# Patient Record
Sex: Male | Born: 1940 | Hispanic: No | Marital: Married | Smoking: Former smoker
Health system: Southern US, Community
[De-identification: ages and names within clinical notes are randomized; demographics above are authoritative.]

## PROBLEM LIST (undated history)

## (undated) DIAGNOSIS — I10 Essential (primary) hypertension: Secondary | ICD-10-CM

## (undated) DIAGNOSIS — A481 Legionnaires' disease: Secondary | ICD-10-CM

## (undated) DIAGNOSIS — E785 Hyperlipidemia, unspecified: Secondary | ICD-10-CM

## (undated) DIAGNOSIS — M8580 Other specified disorders of bone density and structure, unspecified site: Secondary | ICD-10-CM

## (undated) DIAGNOSIS — M1990 Unspecified osteoarthritis, unspecified site: Secondary | ICD-10-CM

## (undated) DIAGNOSIS — M069 Rheumatoid arthritis, unspecified: Secondary | ICD-10-CM

## (undated) DIAGNOSIS — E119 Type 2 diabetes mellitus without complications: Secondary | ICD-10-CM

## (undated) DIAGNOSIS — K7460 Unspecified cirrhosis of liver: Secondary | ICD-10-CM

## (undated) DIAGNOSIS — K219 Gastro-esophageal reflux disease without esophagitis: Secondary | ICD-10-CM

## (undated) DIAGNOSIS — E079 Disorder of thyroid, unspecified: Secondary | ICD-10-CM

## (undated) HISTORY — DX: Legionnaires' disease: A48.1

## (undated) HISTORY — DX: Unspecified osteoarthritis, unspecified site: M19.90

## (undated) HISTORY — DX: Hyperlipidemia, unspecified: E78.5

## (undated) HISTORY — PX: KNEE SURGERY: SHX244

## (undated) HISTORY — DX: Gastro-esophageal reflux disease without esophagitis: K21.9

## (undated) HISTORY — DX: Essential (primary) hypertension: I10

## (undated) HISTORY — DX: Type 2 diabetes mellitus without complications: E11.9

## (undated) HISTORY — PX: COLONOSCOPY: SHX174

## (undated) HISTORY — DX: Disorder of thyroid, unspecified: E07.9

## (undated) HISTORY — DX: Other specified disorders of bone density and structure, unspecified site: M85.80

---

## 1998-09-08 ENCOUNTER — Ambulatory Visit (HOSPITAL_COMMUNITY): Admission: RE | Admit: 1998-09-08 | Discharge: 1998-09-08 | Payer: Self-pay | Admitting: *Deleted

## 1999-12-31 ENCOUNTER — Encounter (INDEPENDENT_AMBULATORY_CARE_PROVIDER_SITE_OTHER): Payer: Self-pay | Admitting: *Deleted

## 1999-12-31 ENCOUNTER — Other Ambulatory Visit: Admission: RE | Admit: 1999-12-31 | Discharge: 1999-12-31 | Payer: Self-pay | Admitting: Gastroenterology

## 1999-12-31 ENCOUNTER — Encounter (INDEPENDENT_AMBULATORY_CARE_PROVIDER_SITE_OTHER): Payer: Self-pay | Admitting: Specialist

## 2001-08-06 ENCOUNTER — Emergency Department (HOSPITAL_COMMUNITY): Admission: EM | Admit: 2001-08-06 | Discharge: 2001-08-06 | Payer: Self-pay | Admitting: Emergency Medicine

## 2002-04-26 ENCOUNTER — Encounter: Payer: Self-pay | Admitting: Internal Medicine

## 2002-04-26 ENCOUNTER — Ambulatory Visit (HOSPITAL_COMMUNITY): Admission: RE | Admit: 2002-04-26 | Discharge: 2002-04-26 | Payer: Self-pay | Admitting: Internal Medicine

## 2002-10-07 ENCOUNTER — Encounter: Payer: Self-pay | Admitting: Internal Medicine

## 2002-10-07 ENCOUNTER — Ambulatory Visit (HOSPITAL_COMMUNITY): Admission: RE | Admit: 2002-10-07 | Discharge: 2002-10-07 | Payer: Self-pay | Admitting: Internal Medicine

## 2003-07-18 ENCOUNTER — Encounter: Admission: RE | Admit: 2003-07-18 | Discharge: 2003-07-18 | Payer: Self-pay | Admitting: Internal Medicine

## 2003-08-01 ENCOUNTER — Ambulatory Visit (HOSPITAL_COMMUNITY): Admission: RE | Admit: 2003-08-01 | Discharge: 2003-08-01 | Payer: Self-pay | Admitting: Internal Medicine

## 2004-03-16 ENCOUNTER — Ambulatory Visit: Payer: Self-pay | Admitting: Cardiology

## 2004-03-16 ENCOUNTER — Ambulatory Visit: Payer: Self-pay | Admitting: Pulmonary Disease

## 2004-03-16 ENCOUNTER — Ambulatory Visit: Payer: Self-pay | Admitting: Physical Medicine & Rehabilitation

## 2004-03-16 ENCOUNTER — Inpatient Hospital Stay (HOSPITAL_COMMUNITY): Admission: EM | Admit: 2004-03-16 | Discharge: 2004-04-02 | Payer: Self-pay | Admitting: Emergency Medicine

## 2004-03-23 ENCOUNTER — Encounter: Payer: Self-pay | Admitting: Cardiology

## 2004-04-02 ENCOUNTER — Ambulatory Visit: Payer: Self-pay | Admitting: Physical Medicine & Rehabilitation

## 2004-04-02 ENCOUNTER — Inpatient Hospital Stay (HOSPITAL_COMMUNITY)
Admission: RE | Admit: 2004-04-02 | Discharge: 2004-04-07 | Payer: Self-pay | Admitting: Physical Medicine & Rehabilitation

## 2004-04-12 ENCOUNTER — Ambulatory Visit: Payer: Self-pay | Admitting: Pulmonary Disease

## 2004-04-20 ENCOUNTER — Encounter
Admission: RE | Admit: 2004-04-20 | Discharge: 2004-04-20 | Payer: Self-pay | Admitting: Physical Medicine & Rehabilitation

## 2004-04-26 ENCOUNTER — Ambulatory Visit: Payer: Self-pay | Admitting: Pulmonary Disease

## 2004-05-03 ENCOUNTER — Encounter
Admission: RE | Admit: 2004-05-03 | Discharge: 2004-08-01 | Payer: Self-pay | Admitting: Physical Medicine & Rehabilitation

## 2004-05-04 ENCOUNTER — Ambulatory Visit: Payer: Self-pay | Admitting: Physical Medicine & Rehabilitation

## 2004-05-28 ENCOUNTER — Ambulatory Visit: Payer: Self-pay | Admitting: Pulmonary Disease

## 2004-07-09 ENCOUNTER — Ambulatory Visit: Payer: Self-pay | Admitting: Pulmonary Disease

## 2004-10-06 ENCOUNTER — Ambulatory Visit: Payer: Self-pay | Admitting: Infectious Diseases

## 2004-10-22 ENCOUNTER — Ambulatory Visit: Payer: Self-pay | Admitting: Pulmonary Disease

## 2005-01-03 ENCOUNTER — Ambulatory Visit: Payer: Self-pay | Admitting: Psychology

## 2005-02-03 ENCOUNTER — Ambulatory Visit: Payer: Self-pay | Admitting: Psychology

## 2005-07-19 IMAGING — CR DG CHEST 1V PORT
1 series · 1 of 1 positions shown · non-contrast
Comparison: none

CLINICAL DATA: Fever of unknown origin.  Altered mental status.

[view not recorded]
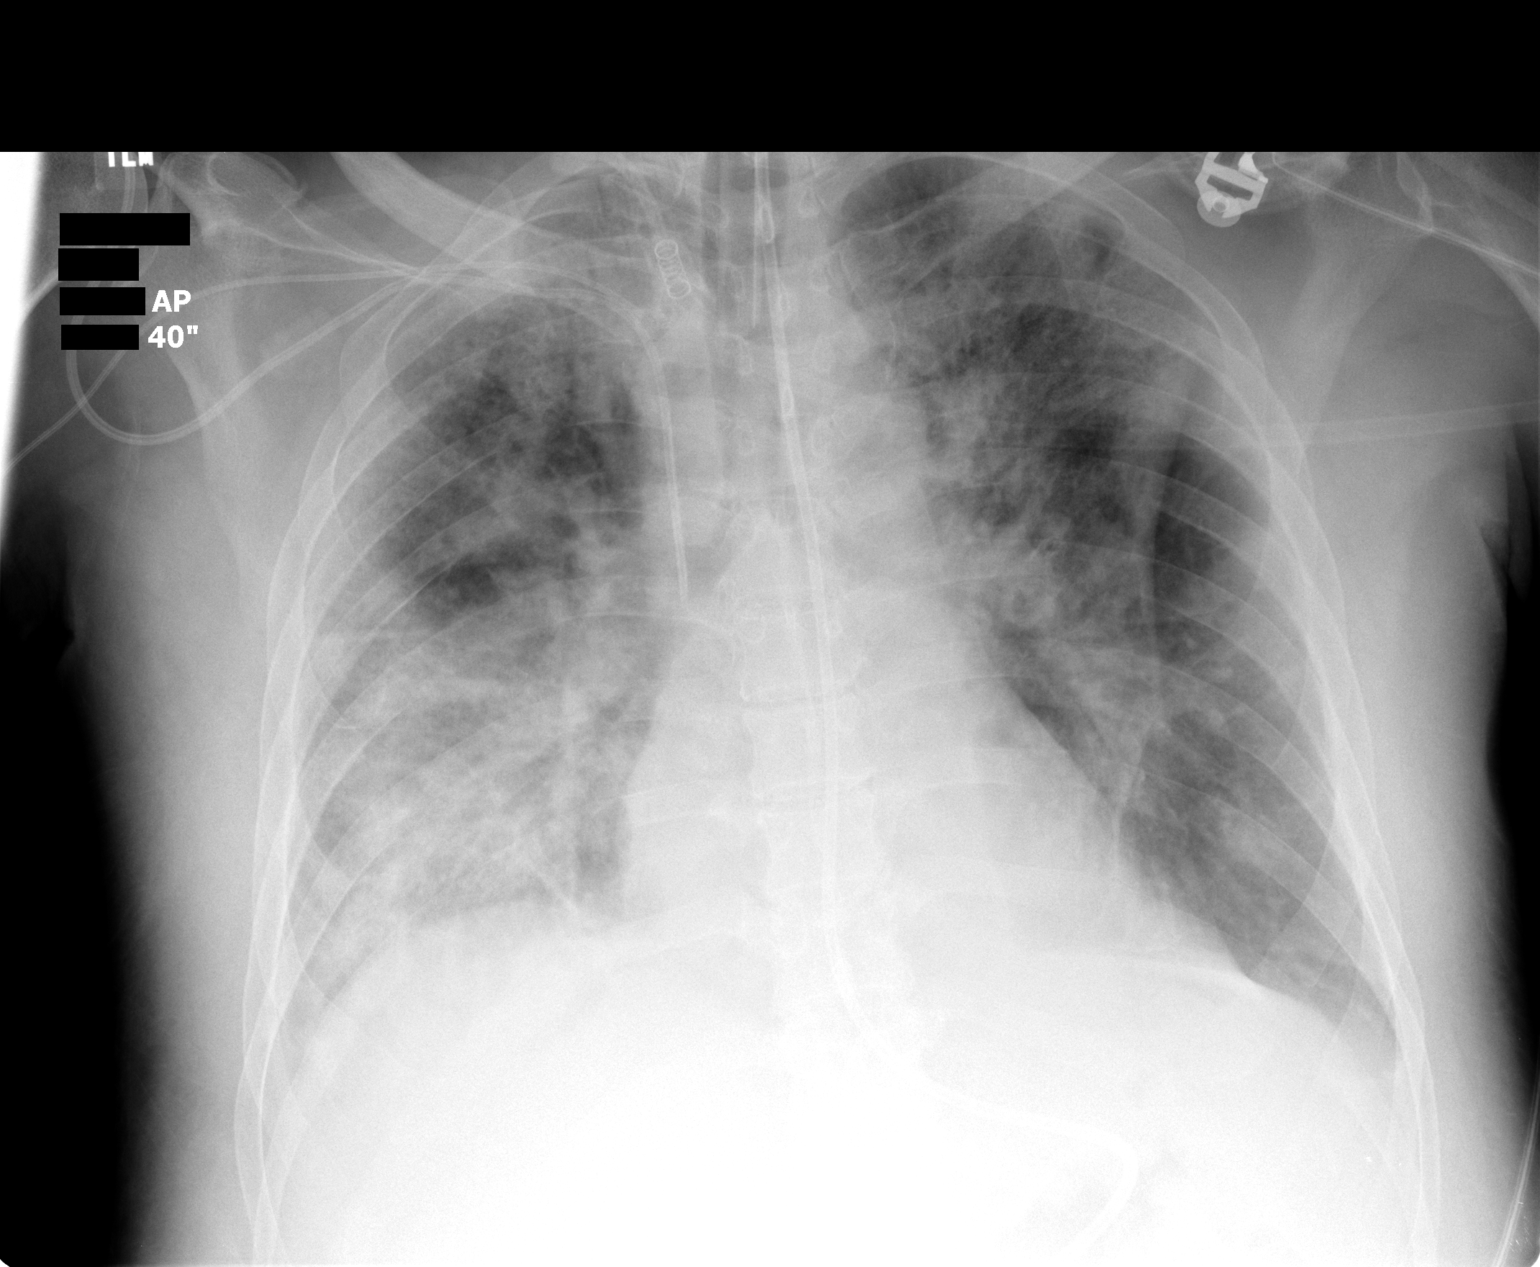

[1 of 1 positions shown; findings below may reference images not displayed]

DG CHEST PORTABLE SINGLE-VIEW:

An AP semierect portable film of the chest dated 03/26/04 at 8284 hours is compared to a previous
study of 03/25/2004 at 0160 hours and shows slight improvement in overall airspace disease and
edema.  The support tubes remain in good position.  There is improvement in bilateral basilar
atelectasis.
IMPRESSION: Interval improvement in aeration and airspace disease.  Support tubes unchanged.

## 2005-07-20 IMAGING — CR DG ABD PORTABLE 1V
1 series · 1 of 1 positions shown · non-contrast
Comparison: none

CLINICAL DATA: Placement of panda tube.  
 PORTABLE ABDOMEN 03/27/04 AT [DATE]:
 Compared with earlier study on this same date. 
 The panda tube has been advanced and extends into the antrum and then loops back with the tip in the fundus of the stomach. 
 Again noted is the extensive right lung infiltrate.

[view not recorded]
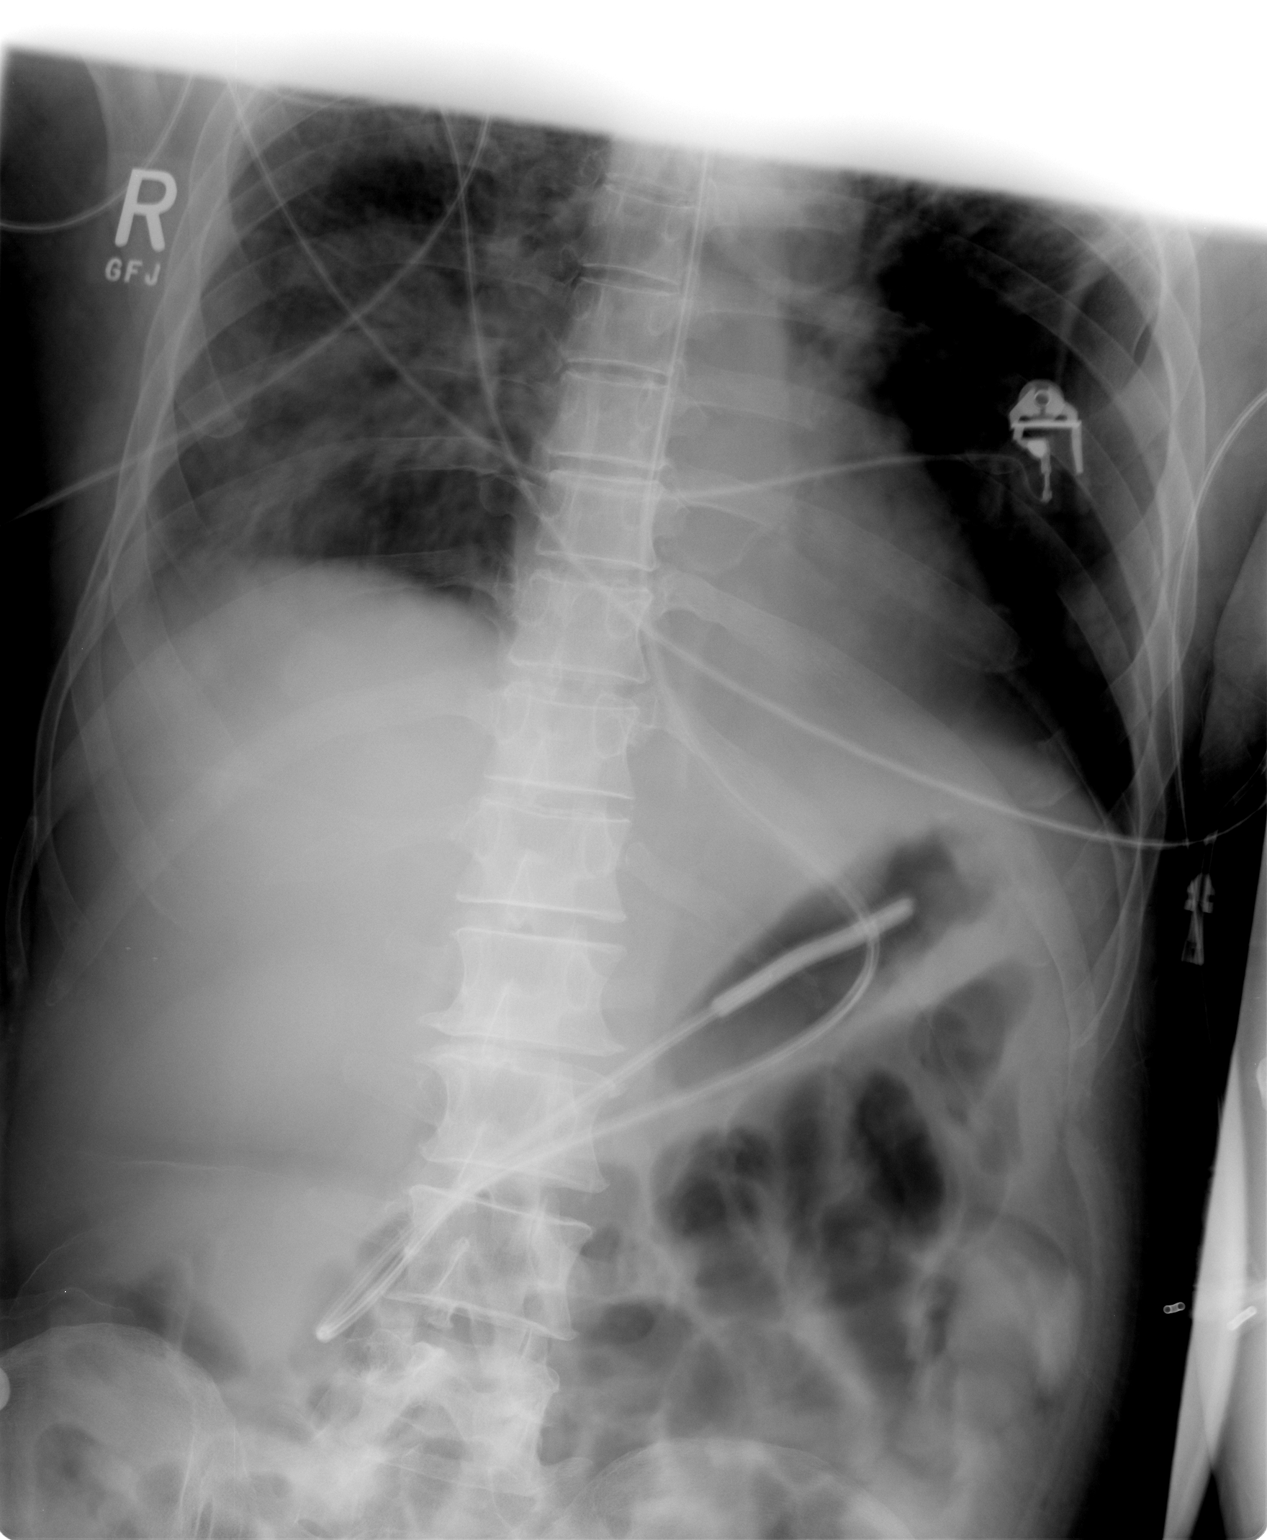

[1 of 1 positions shown; findings below may reference images not displayed]

IMPRESSION: Panda tube is now in the stomach and loops back into the gastric fundus.

## 2005-07-20 IMAGING — CR DG ABD PORTABLE 1V
1 series · 1 of 1 positions shown · non-contrast
Comparison: none

CLINICAL DATA: Panda tube placement.  Fever of unknown origin.
 PORTABLE ABDOMEN, ONE VIEW ? 03/27/2004 ? (7442 Hours)
 Panda tube tip is at the gastroesophageal junction.  Diffuse infiltrate is noted in the right lung base.  
 Bowel gas pattern is normal.

[view not recorded]
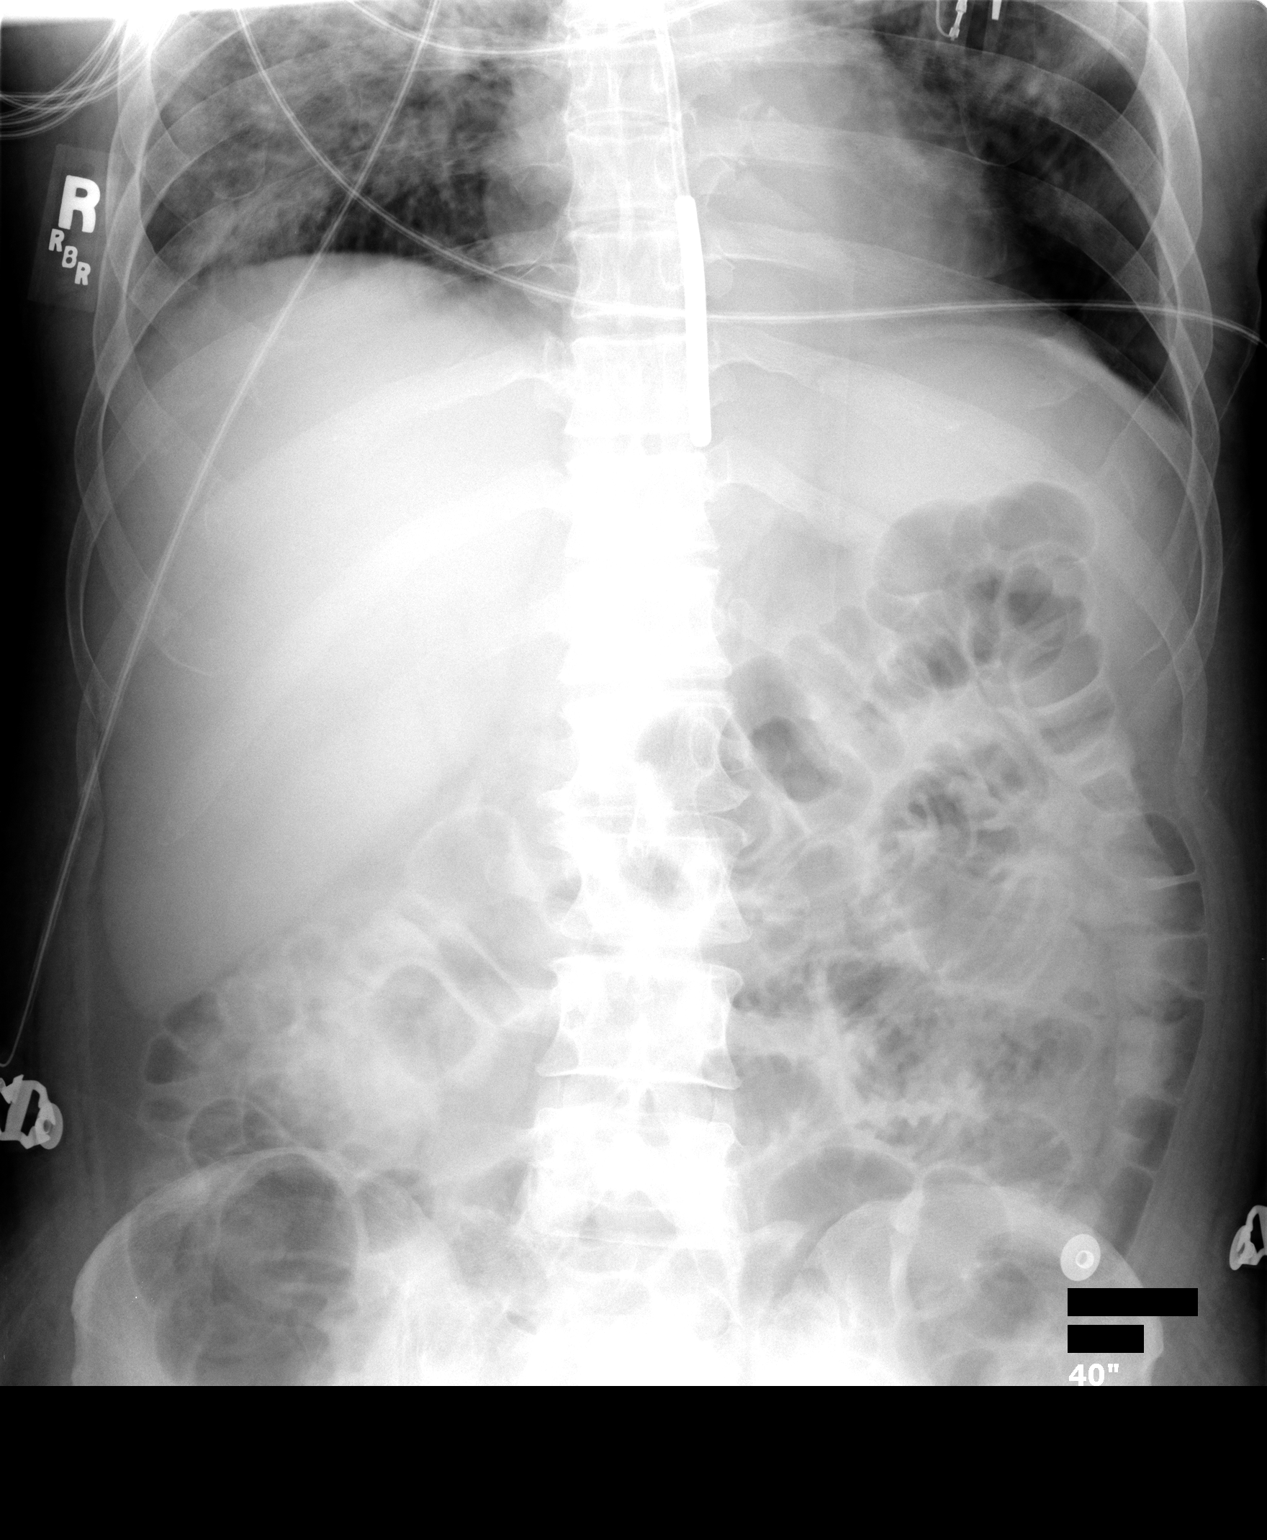

[1 of 1 positions shown; findings below may reference images not displayed]

IMPRESSION: Panda tube tip is at the gastroesophageal junction.

## 2005-12-22 ENCOUNTER — Encounter: Payer: Self-pay | Admitting: Vascular Surgery

## 2005-12-22 ENCOUNTER — Ambulatory Visit (HOSPITAL_COMMUNITY): Admission: RE | Admit: 2005-12-22 | Discharge: 2005-12-22 | Payer: Self-pay | Admitting: Internal Medicine

## 2006-01-24 ENCOUNTER — Encounter (INDEPENDENT_AMBULATORY_CARE_PROVIDER_SITE_OTHER): Payer: Self-pay | Admitting: *Deleted

## 2006-01-24 ENCOUNTER — Encounter: Admission: RE | Admit: 2006-01-24 | Discharge: 2006-01-24 | Payer: Self-pay | Admitting: Internal Medicine

## 2006-03-16 ENCOUNTER — Encounter (INDEPENDENT_AMBULATORY_CARE_PROVIDER_SITE_OTHER): Payer: Self-pay | Admitting: *Deleted

## 2006-03-16 ENCOUNTER — Ambulatory Visit (HOSPITAL_COMMUNITY): Admission: RE | Admit: 2006-03-16 | Discharge: 2006-03-16 | Payer: Self-pay | Admitting: Gastroenterology

## 2006-03-16 ENCOUNTER — Encounter (INDEPENDENT_AMBULATORY_CARE_PROVIDER_SITE_OTHER): Payer: Self-pay | Admitting: Specialist

## 2007-12-03 ENCOUNTER — Encounter: Payer: Self-pay | Admitting: Critical Care Medicine

## 2007-12-03 ENCOUNTER — Telehealth: Payer: Self-pay | Admitting: Critical Care Medicine

## 2007-12-13 ENCOUNTER — Ambulatory Visit (HOSPITAL_COMMUNITY): Payer: Self-pay | Admitting: Psychology

## 2008-01-17 ENCOUNTER — Ambulatory Visit (HOSPITAL_COMMUNITY): Payer: Self-pay | Admitting: Psychology

## 2008-01-18 ENCOUNTER — Ambulatory Visit (HOSPITAL_COMMUNITY): Payer: Self-pay | Admitting: Psychology

## 2009-07-03 ENCOUNTER — Telehealth: Payer: Self-pay | Admitting: Gastroenterology

## 2009-07-08 ENCOUNTER — Encounter (INDEPENDENT_AMBULATORY_CARE_PROVIDER_SITE_OTHER): Payer: Self-pay | Admitting: *Deleted

## 2009-08-06 ENCOUNTER — Encounter (INDEPENDENT_AMBULATORY_CARE_PROVIDER_SITE_OTHER): Payer: Self-pay | Admitting: *Deleted

## 2009-09-04 ENCOUNTER — Ambulatory Visit: Payer: Self-pay | Admitting: Gastroenterology

## 2009-09-04 ENCOUNTER — Encounter (INDEPENDENT_AMBULATORY_CARE_PROVIDER_SITE_OTHER): Payer: Self-pay | Admitting: *Deleted

## 2009-09-04 DIAGNOSIS — R12 Heartburn: Secondary | ICD-10-CM

## 2009-11-09 ENCOUNTER — Telehealth: Payer: Self-pay | Admitting: Gastroenterology

## 2009-11-20 ENCOUNTER — Encounter (INDEPENDENT_AMBULATORY_CARE_PROVIDER_SITE_OTHER): Payer: Self-pay | Admitting: *Deleted

## 2009-12-11 ENCOUNTER — Encounter (INDEPENDENT_AMBULATORY_CARE_PROVIDER_SITE_OTHER): Payer: Self-pay | Admitting: *Deleted

## 2009-12-15 ENCOUNTER — Ambulatory Visit: Payer: Self-pay | Admitting: Gastroenterology

## 2009-12-22 ENCOUNTER — Ambulatory Visit: Payer: Self-pay | Admitting: Gastroenterology

## 2009-12-24 ENCOUNTER — Encounter: Payer: Self-pay | Admitting: Gastroenterology

## 2010-01-22 ENCOUNTER — Encounter: Admission: RE | Admit: 2010-01-22 | Discharge: 2010-01-22 | Payer: Self-pay | Admitting: Internal Medicine

## 2010-02-12 ENCOUNTER — Encounter: Admission: RE | Admit: 2010-02-12 | Discharge: 2010-02-12 | Payer: Self-pay | Admitting: Internal Medicine

## 2010-02-19 ENCOUNTER — Encounter: Admission: RE | Admit: 2010-02-19 | Discharge: 2010-02-19 | Payer: Self-pay | Admitting: Internal Medicine

## 2010-03-18 ENCOUNTER — Encounter: Admission: RE | Admit: 2010-03-18 | Discharge: 2010-03-18 | Payer: Self-pay | Admitting: Internal Medicine

## 2010-06-13 ENCOUNTER — Encounter: Payer: Self-pay | Admitting: Internal Medicine

## 2010-06-24 NOTE — Letter (Signed)
Summary: [facility] Instructions  [facility]  [place], [place] [address]   Phone: [phone]  Fax: [phone]       [name]    [birth year]    MRN: [id number]        Procedure Day /Date:10/07/09     Arrival Time:730 am     Procedure Time:830 am     Location of Procedure:                    X  [place] (4th Floor)                        [facility]   Starting 5 days prior to your procedure 10/02/09 do not eat nuts, seeds, popcorn, corn, beans, peas,  salads, or any raw vegetables.  Do not take any fiber supplements (e.g. Metamucil, Citrucel, and Benefiber).  THE DAY BEFORE YOUR PROCEDURE         DATE: 10/06/09  DAY: TUE  1.  Drink clear liquids the entire day-NO SOLID FOOD  2.  Do not drink anything colored red or purple.  Avoid juices with pulp.  No orange juice.  3.  Drink at least 64 oz. (8 glasses) of fluid/clear liquids during the day to prevent dehydration and help the prep work efficiently.  CLEAR LIQUIDS INCLUDE: Water Jello Ice Popsicles Tea (sugar ok, no milk/cream) Powdered fruit flavored drinks Coffee (sugar ok, no milk/cream) Gatorade Juice: apple, white grape, white cranberry  Lemonade Clear bullion, consomm, broth Carbonated beverages (any kind) Strained chicken noodle soup Hard Candy                             4.  In the morning, mix first dose of MoviPrep solution:    Empty 1 Pouch A and 1 Pouch B into the disposable container    Add lukewarm drinking water to the top line of the container. Mix to dissolve    Refrigerate (mixed solution should be used within 24 hrs)  5.  Begin drinking the prep at 5:00 p.m. The MoviPrep container is divided by 4 marks.   Every 15 minutes drink the solution down to the next mark (approximately 8 oz) until the full liter is complete.   6.  Follow completed prep with 16 oz of clear liquid of your choice (Nothing red or purple).   Continue to drink clear liquids until bedtime.  7.  Before going to bed, mix second dose of MoviPrep solution:    Empty 1 Pouch A and 1 Pouch B into the disposable container    Add lukewarm drinking water to the top line of the container. Mix to dissolve    Refrigerate  THE DAY OF YOUR PROCEDURE      DATE: 10/07/09 DAY:WED  Beginning at 330 a.m. (5 hours before procedure):         1. Every 15 minutes, drink the solution down to the next mark (approx 8 oz) until the full liter is complete.  2. Follow completed prep with 16 oz. of clear liquid of your choice.    3. You may drink clear liquids until 630 am (2 HOURS BEFORE PROCEDURE).   MEDICATION INSTRUCTIONS  Unless otherwise instructed, you should take regular prescription medications with a small sip of water   as early as possible the morning of your procedure.  OTHER INSTRUCTIONS  You will need a responsible adult at least 70 years of age to accompany you and drive you home.   This person must remain in the waiting room during your procedure.  Wear loose fitting clothing that is easily removed.  Leave jewelry and other valuables at home.  However, you may wish to bring a book to read or  an iPod/MP3 player to listen to music as you wait for your procedure to start.  Remove all body piercing jewelry and leave at home.  Total time from sign-in until discharge is approximately 2-3 hours.  You should go home directly after your procedure and rest.  You can resume normal activities the  day after your procedure.  The day of your procedure you should not:   Drive   Make legal decisions   Operate machinery   Drink alcohol   Return to work  You will receive specific instructions about eating, activities and medications before you leave.    The above instructions have been reviewed and explained to me by   _______________________    I fully understand and can verbalize these instructions  _____________________________ Date _________

## 2010-06-24 NOTE — Miscellaneous (Signed)
Summary: [facility]  Clinical Lists Changes  Medications: Added new medication of MOVIPREP 100 GM  SOLR (PEG-KCL-NACL-NASULF-NA ASC-C) As per prep instructions. - Signed Rx of MOVIPREP 100 GM  SOLR (PEG-KCL-NACL-NASULF-NA ASC-C) As per prep instructions.;  #1 x 0;  Signed;  Entered by: Ulice Dash RN;  Authorized by: Ladene Artist MD [facility];  Method used: Electronically to [facility] [phone]*, [place], [place], [place]  [address], Ph: [phone] or [phone], Fax: [phone] Observations: Added new observation of NKA: T (12/15/2009 9:52)    Prescriptions: MOVIPREP 100 GM  SOLR (PEG-KCL-NACL-NASULF-NA ASC-C) As per prep instructions.  #1 x 0   Entered by:   Ulice Dash RN   Authorized by:   Ladene Artist MD [facility]   Signed by:   Ulice Dash RN on 12/15/2009   Method used:   Electronically to        [facility] [phone]* (retail)       [address] [place], [place]  [address]       Ph: [phone] or [phone]       Fax: [phone]   RxID:   [id number]

## 2010-06-24 NOTE — Progress Notes (Signed)
Summary: Switch from Dr. Fuller Plan to Dr. Ardis Hughs  Phone Note Call from Patient Call back at [facility] Phone [phone]   Caller: Patient Call For: Dr. Fuller Plan Reason for Call: Talk to Nurse Summary of Call: Pt is a patient of Dr. Fuller Plan and wants to see Dr. Ardis Hughs. Initial call taken by: Webb Laws,  July 03, 2009 1:54 PM  Follow-up for Phone Call        Dr Fuller Plan is this ok with you? Please find records from my care of this patient. There are no GI records in [facility]. Follow-up by: Ladene Artist MD Marval Regal,  July 03, 2009 2:44 PM  Additional Follow-up for Phone Call Additional follow up Details #1::        I have ordered paper chart.    Additional Follow-up for Phone Call Additional follow up Details #2::    Paper chart placed on your desk Follow-up by: Barb Merino RN, [place],  July 06, 2009 11:41 AM  Additional Follow-up for Phone Call Additional follow up Details #3:: Details for Additional Follow-up Action Taken: Per Dr Fuller Plan ok to switch.  Dr Ardis Hughs will you accept patient? Additional Follow-up by: Barb Merino RN, [place],  July 06, 2009 12:15 PM    I think I would like to see the paper chart as well.    Appended Document: Switch from Dr. Fuller Plan to Dr. Ardis Hughs I will put paper chart in your office for your review  Appended Document: Switch from Dr. Fuller Plan to Dr. Ardis Hughs happy to see him.  Appended Document: Switch from Dr. Fuller Plan to Dr. Ardis Hughs message left for pt to call and schedule appt with Dr Ardis Hughs

## 2010-06-24 NOTE — Miscellaneous (Signed)
Summary: rx  Clinical Lists Changes  Medications: Added new medication of XIFAXAN 550 MG TABS (RIFAXIMIN) one p.o. tid - Signed Rx of XIFAXAN 550 MG TABS (RIFAXIMIN) one p.o. tid;  #30 x 0;  Signed;  Entered by: Margie Ege;  Authorized by: Ladene Artist MD [facility];  Method used: Electronically to [facility] [phone]*, [place], [place], [place]  [address], Ph: [phone] or [phone], Fax: [phone] Observations: Added new observation of ALLERGY REV: Done (12/22/2009 11:04)    Prescriptions: XIFAXAN 550 MG TABS (RIFAXIMIN) one p.o. tid  #30 x 0   Entered by:   Margie Ege   Authorized by:   Ladene Artist MD [facility]   Signed by:   Margie Ege on 12/22/2009   Method used:   Electronically to        [facility] [phone]* (retail)       [address] [place], [place]  [address]       Ph: [phone] or [phone]       Fax: [phone]   RxID:   [id number]

## 2010-06-24 NOTE — Letter (Signed)
Summary: New Patient letter  [facility]  [place], [place] [address]   Phone: [phone]  Fax: [phone]       07/08/2009 MRN: [id number]  [name] [address] [place], [place]  [address]  Dear [name],  Welcome to the Gastroenterology Division at [facility].    You are scheduled to see Dr.  Ardis Hughs on 08-11-09 at 2:30p.m. on the 3rd floor at [facility], [place] [employer].  We ask that you try to arrive at our office 15 minutes prior to your appointment time to allow for check-in.  We would like you to complete the enclosed self-administered evaluation form prior to your visit and bring it with you on the day of your appointment.  We will review it with you.  Also, please bring a complete list of all your medications or, if you prefer, bring the medication bottles and we will list them.  Please bring your insurance card so that we may make a copy of it.  If your insurance requires a referral to see a specialist, please bring your referral form from your primary care physician.  Co-payments are due at the time of your visit and may be paid by cash, check or credit card.     Your office visit will consist of a consult with your physician (includes a physical exam), any laboratory testing he/she may order, scheduling of any necessary diagnostic testing (e.g. x-ray, ultrasound, CT-scan), and scheduling of a procedure (e.g. Endoscopy, Colonoscopy) if required.  Please allow enough time on your schedule to allow for any/all of these possibilities.    If you cannot keep your appointment, please call [phone] to cancel or reschedule prior to your appointment date.  This allows [place] the opportunity to schedule an appointment for another patient in need of care.  If you do not cancel or reschedule by 5 p.m. the business day prior to your appointment date, you will be charged a $50.00 late cancellation/no-show fee.    Thank you for  choosing [facility] for your medical needs.  We appreciate the opportunity to care for you.  Please visit [place] at our website  to learn more about our practice.                     Sincerely,                                                             The Gastroenterology Division

## 2010-06-24 NOTE — Letter (Signed)
Summary: [facility] Instructions  [facility]  [place], [place] [address]   Phone: [phone]  Fax: [phone]       [name]    May 12, 1969    MRN: [id number]        Procedure Day /Date: Tuesday 12-22-09     Arrival Time: 9:00 a.m.     Procedure Time: 10:00 a.m.     Location of Procedure:                    _x_  [place] (4th Floor)   [place]   Starting 5 days prior to your procedure  12-17-09 do not eat nuts, seeds, popcorn, corn, beans, peas,  salads, or any raw vegetables.  Do not take any fiber supplements (e.g. Metamucil, Citrucel, and Benefiber).  THE DAY BEFORE YOUR PROCEDURE         DATE:  12-21-09   DAY: Monday   1.  Drink clear liquids the entire day-NO SOLID FOOD  2.  Do not drink anything colored red or purple.  Avoid juices with pulp.  No orange juice.  3.  Drink at least 64 oz. (8 glasses) of fluid/clear liquids during the day to prevent dehydration and help the prep work efficiently.  CLEAR LIQUIDS INCLUDE: Water Jello Ice Popsicles Tea (sugar ok, no milk/cream) Powdered fruit flavored drinks Coffee (sugar ok, no milk/cream) Gatorade Juice: apple, white grape, white cranberry  Lemonade Clear bullion, consomm, broth Carbonated beverages (any kind) Strained chicken noodle soup Hard Candy                             4.  In the morning, mix first dose of MoviPrep solution:    Empty 1 Pouch A and 1 Pouch B into the disposable container    Add lukewarm drinking water to the top line of the container. Mix to dissolve    Refrigerate (mixed solution should be used within 24 hrs)  5.  Begin drinking the prep at 5:00 p.m. The MoviPrep container is divided by 4 marks.   Every 15 minutes drink the solution down to the next mark (approximately 8 oz) until the full liter is complete.   6.  Follow completed prep with 16 oz of clear liquid of your choice (Nothing red or  purple).  Continue to drink clear liquids until bedtime.  7.  Before going to bed, mix second dose of MoviPrep solution:    Empty 1 Pouch A and 1 Pouch B into the disposable container    Add lukewarm drinking water to the top line of the container. Mix to dissolve    Refrigerate  THE DAY OF YOUR PROCEDURE      DATE:  12-22-09  DAY: Tuesday  Beginning at  5:00 a.m. (5 hours before procedure):         1. Every 15 minutes, drink the solution down to the next mark (approx 8 oz) until the full liter is complete.  2. Follow completed prep with 16 oz. of clear liquid of your choice.    3. You may drink clear liquids until  8:00 a.m. (2 HOURS BEFORE PROCEDURE).   MEDICATION INSTRUCTIONS  Unless otherwise instructed, you should take regular prescription medications with a small sip of water   as early as possible the morning of your procedure.           OTHER INSTRUCTIONS  You will need a responsible adult at least 70 years of age to accompany you and drive you home.   This person must remain in the waiting room during your procedure.  Wear loose fitting clothing that is easily removed.  Leave jewelry and other valuables at home.  However, you may wish to bring a book to read or  an iPod/MP3 player to listen to music as you wait for your procedure to start.  Remove all body piercing jewelry and leave at home.  Total time from sign-in until discharge is approximately 2-3 hours.  You should go home directly after your procedure and rest.  You can resume normal activities the  day after your procedure.  The day of your procedure you should not:   Drive   Make legal decisions   Operate machinery   Drink alcohol   Return to work  You will receive specific instructions about eating, activities and medications before you leave.    The above instructions have been reviewed and explained to me by   Ulice Dash RN  December 15, 2009 10:18 AM   I fully understand and can  verbalize these instructions _____________________________ Date _________

## 2010-06-24 NOTE — Progress Notes (Signed)
Summary: Switch Providers  Phone Note Call from Patient   Caller: Patient Call For: Dr. Ardis Hughs Reason for Call: Talk to Nurse Summary of Call: pt. would like to switch from Dr. Ardis Hughs back to Dr. Fuller Plan. pt. stated "he was not pleased w/Dr. Ardis Hughs. He prescribed him Nexium and he has been on Nexium for the past 10 yrs and it does not work." Initial call taken by: Webb Laws,  November 09, 2009 4:10 PM  Follow-up for Phone Call        That is ok with me if OK with Dr. Fuller Plan.  I did quite a bit more than simply represcribe his nexium actually.Marland Kitchen. (changed the way he was taking it so it was at proper timing before meal, added H2 blocker at night, counciled him on GERD (specifically that he smoking can make GERD worse), gave him a gerd handout and also set him up for screening colonoscopy. Follow-up by: Milus Banister MD,  November 10, 2009 7:38 AM  Additional Follow-up for Phone Call Additional follow up Details #1::        I would like to review his paper chart before deciding. Has seen Dr. Collene Mares, me, Dr. Ardis Hughs and now wants to see me again. Additional Follow-up by: Ladene Artist MD Madaline Guthrie 9:36 AM    Additional Follow-up for Phone Call Additional follow up Details #2::    paper chart ordered.] Chart on your desk Barb Merino RN, [facility]  November 10, 2009 11:30 AM Follow-up by: Barb Merino RN, [place],  November 10, 2009 9:42 AM  Additional Follow-up for Phone Call Additional follow up Details #3:: Details for Additional Follow-up Action Taken: OK to switch back to me. Additional Follow-up by: Ladene Artist MD Marval Regal,  November 10, 2009 4:25 PM  screening colon scheduled for 12/22/09 10:00, pre-visit 12/15/09 10:00 Barb Merino RN, [facility]  November 11, 2009 8:31 AM

## 2010-06-24 NOTE — Assessment & Plan Note (Signed)
History of Present Illness Visit Type: Initial Visit Primary GI MD: Owens Loffler MD Primary Provider: Emilee Hero, MD Chief Complaint: bloating and gas after eating started when pt. quit smoking with Nicorrette gum History of Present Illness:     very pleasant 70 year old vegetarian man who has had many years of upper GI discomforts. He underwent several tests by Dr. Collene Mares in 2007. This included upper GI series, an EGD. On the EGD she documented some mild gastritis, some polyps, some mild duodenitis. 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 pylori, no evidence for celiac sprue.  he had a colonoscopy by Dr. Fuller Plan in 2001, hyperplastic polyps were found, he was recommended to have a repeat colonoscopy at 10 year interval.   last october, he stopped smoking. Started taking a patch, had skin reaction and then tried the gum, eventually switched to nicorett lozenges.  He feels bloating in abdomen.  He has a lot of flatus as well. He takes gas ex periodically and it can help.   he will have soreness in abdomen, bloated, burning in epigastrium if he does not take enxium.   He will not get pyrosis, acid taste in mouth.  he takes nexium after dinner.  his pant size has increased from 34 to 38 inches.  eating will cause bloating.  No nausea, no vmoitting.  All foods will cause bloating. He is a Midwife.  DAiry isn't such an issue.  Lactaid milk doesn't change his symptoms.  overall his weight is stable over the past year.           Current Medications (verified): 1)  Nexium 40 Mg Cpdr (Esomeprazole Magnesium) .Marland Kitchen.. 1 Capsule By Mouth Once Daily 2)  Ramipril 5 Mg Caps (Ramipril) .Marland Kitchen.. 1 Capsule By Mouth Two Times A Day 3)  Synthroid 100 Mcg Tabs (Levothyroxine Sodium) .Marland Kitchen.. 1 Tablet By Mouth Once Daily  Allergies (verified): No Known Drug Allergies  Past History:  Past Medical History: arthritis Hypertension Hypothyroidism Cigarette dependence  Past Surgical  History: none  Family History: no colon cancer  Social History: he is married, he has to children, he is a Designer, industrial/product, he smokes cigarettes although continues to try to quit, he drinks one alcoholic beverage per day, he drinks one caffeinated beverage per day.  Review of Systems       Pertinent positive and negative review of systems were noted in the above HPI and GI specific review of systems.  All other review of systems was otherwise negative.   Vital Signs:  Patient profile:   70 year old male Height:      67.5 inches Weight:      163.13 pounds BMI:     25.26 Pulse rate:   74 / minute Pulse rhythm:   regular BP sitting:   130 / 74  (left arm)  Vitals Entered By: Randye Lobo NCMA (September 04, 2009 10:09 AM)  Physical Exam  Additional Exam:  Constitutional: generally well appearing Psychiatric: alert and oriented times 3 Eyes: extraocular movements intact Mouth: oropharynx moist, no lesions Neck: supple, no lymphadenopathy Cardiovascular: heart regular rate and rythm Lungs: CTA bilaterally Abdomen: soft, non-tender, non-distended, no obvious ascites, no peritoneal signs, normal bowel sounds Extremities: no lower extremity edema bilaterally Skin: no lesions on visible extremities    Impression & Recommendations:  Problem # 1:  Dyspepsia, GERD related? I suspect some of his symptoms are GERD related. I recommended that he change the way taking his proton pump inhibitor  and add H2 blocker at bedtime. He will also be given a GERD handout. We will see how he responds to these changes.  Problem # 2:  routine risk for colon cancer he is due for 10 year interval colonoscopy around now. We will schedule this at his soonest convenience.  Patient Instructions: 1)  You should change the way you are taking your antiacid medicine (nexium) so that you are taking it 20-30 minutes prior to a decent meal as that is the way the pill is designed to work most  effectively.  Take this before lunchtime meal. 2)  Start zantac, one pill at bedtime every night. 3)  You will be scheduled to have a colonoscopy. 4)  GERD handout.  Smoking can make acid issues worse. 5)  Continue to try to stop smoking. 6)  A copy of this information will be sent to Dr. Ashby Dawes. 7)  The medication list was reviewed and reconciled.  All changed / newly prescribed medications were explained.  A complete medication list was provided to the patient / caregiver.  Appended Document: Orders Update/movi    Clinical Lists Changes  Problems: Added new problem of HEARTBURN (ICD-787.1) Added new problem of SPECIAL SCREENING FOR MALIGNANT NEOPLASMS COLON (ICD-V76.51) Medications: Added new medication of MOVIPREP 100 GM  SOLR (PEG-KCL-NACL-NASULF-NA ASC-C) As per prep instructions. - Signed Rx of MOVIPREP 100 GM  SOLR (PEG-KCL-NACL-NASULF-NA ASC-C) As per prep instructions.;  #1 x 0;  Signed;  Entered by: Christian Mate CMA (AAMA);  Authorized by: Milus Banister MD;  Method used: Electronically to [facility] [phone]*, [place], [place], [place]  [address], Ph: [phone] or [phone], Fax: [phone] Orders: Added new Test order of Colonoscopy (Colon) - Signed    Prescriptions: MOVIPREP 100 GM  SOLR (PEG-KCL-NACL-NASULF-NA ASC-C) As per prep instructions.  #1 x 0   Entered by:   Christian Mate CMA ([place])   Authorized by:   Milus Banister MD   Signed by:   Christian Mate CMA ([place]) on 09/04/2009   Method used:   Electronically to        [facility] [phone]* (retail)       [address] [place], [place]  [address]       Ph: [phone] or [phone]       Fax: [phone]   RxID:   [id number]

## 2010-06-24 NOTE — Letter (Signed)
Summary: New Patient letter  [facility]  [place], [place] [address]   Phone: [phone]  Fax: [phone]       08/06/2009 MRN: [id number]  [name] [place], [place]  [address]  Dear [name],  Welcome to the Gastroenterology Division at [facility].    You are scheduled to see Dr.  Ardis Hughs on 09-04-09 at 10:00a.m. on the 3rd floor at [facility], [place] [employer].  We ask that you try to arrive at our office 15 minutes prior to your appointment time to allow for check-in.  We would like you to complete the enclosed self-administered evaluation form prior to your visit and bring it with you on the day of your appointment.  We will review it with you.  Also, please bring a complete list of all your medications or, if you prefer, bring the medication bottles and we will list them.  Please bring your insurance card so that we may make a copy of it.  If your insurance requires a referral to see a specialist, please bring your referral form from your primary care physician.  Co-payments are due at the time of your visit and may be paid by cash, check or credit card.     Your office visit will consist of a consult with your physician (includes a physical exam), any laboratory testing he/she may order, scheduling of any necessary diagnostic testing (e.g. x-ray, ultrasound, CT-scan), and scheduling of a procedure (e.g. Endoscopy, Colonoscopy) if required.  Please allow enough time on your schedule to allow for any/all of these possibilities.    If you cannot keep your appointment, please call [phone] to cancel or reschedule prior to your appointment date.  This allows [place] the opportunity to schedule an appointment for another patient in need of care.  If you do not cancel or reschedule by 5 p.m. the business day prior to your appointment date, you will be charged a $50.00 late cancellation/no-show fee.    Thank you for  choosing [facility] for your medical needs.  We appreciate the opportunity to care for you.  Please visit [place] at our website  to learn more about our practice.                     Sincerely,                                                             The Gastroenterology Division

## 2010-06-24 NOTE — Letter (Signed)
Summary: Colonoscopy Letter  [facility]  [place], [place] [address]   Phone: [phone]  Fax: [phone]      November 20, 2009 MRN: [id number]   [name] [name], [name]  [address]   Dear [name],   According to your medical record, it is time for you to schedule a Colonoscopy. The American Cancer Society recommends this procedure as a method to detect early colon cancer. Patients with a family history of colon cancer, or a personal history of colon polyps or inflammatory bowel disease are at increased risk.  This letter has beeen generated based on the recommendations made at the time of your procedure. If you feel that in your particular situation this may no longer apply, please contact our office.  Please call our office at [phone] to schedule this appointment or to update your records at your earliest convenience.  Thank you for cooperating with [place] to provide you with the very best care possible.   Sincerely,  Norberto Sorenson T. Fuller Plan, M.D.  [facility] [phone]

## 2010-06-24 NOTE — Procedures (Signed)
Summary: Colonoscopy  Patient: [name] Note: All result statuses are Final unless otherwise noted.  Tests: (1) Colonoscopy (COL)   COL Colonoscopy           DONE (C)     [facility] [employer]     [place], [place]  [address]           COLONOSCOPY PROCEDURE REPORT     PATIENT:  [name]  MR#:  [id number]     BIRTHDATE:  Jun 03, 1940, 69 yrs. old  GENDER:  male     ENDOSCOPIST:  Norberto Sorenson T. Fuller Plan, MD, [facility]           PROCEDURE DATE:  12/22/2009     PROCEDURE:  Colonoscopy with biopsy and snare polypectomy     ASA CLASS:  Class II     INDICATIONS:  1) Routine Risk Screening     MEDICATIONS:   Fentanyl 50 mcg IV, Versed 7 mg IV     DESCRIPTION OF PROCEDURE:   After the risks benefits and     alternatives of the procedure were thoroughly explained, informed     consent was obtained.  Digital rectal exam was performed and     revealed no abnormalities.   The LB PCF-H180AL [id number] endoscope     was introduced through the anus and advanced to the cecum, which     was identified by both the appendix and ileocecal valve, without     limitations.  The quality of the prep was excellent, using     MoviPrep.  The instrument was then slowly withdrawn as the colon     was fully examined.     <<PROCEDUREIMAGES>>     FINDINGS:  A sessile polyp was found in the mid transverse colon.     It was 5 mm in size. The polyp was removed using cold biopsy     forceps.  Three polyps were found in the sigmoid colon. They were     4 - 6 mm in size. Polyps were snared without cautery. Retrieval     was successful. Six polyps were found in the rectum and sigmoid     colon. They were 3 - 4 mm in size. Polyps retrieved and sent to     pathology.  This was otherwise a normal examination of the colon.     Retroflexed views in the rectum revealed internal hemorrhoids,     moderate.  The time to cecum =  1.5  minutes. The scope was then     withdrawn (time =  14.5  min) from the  patient and the procedure     completed.           COMPLICATIONS:  None           ENDOSCOPIC IMPRESSION:     1) 5 mm sessile polyp in the mid transverse colon     2) 4 - 6 mm Three polyps in the sigmoid colon     3) 3 - 4 mm Six polyps in the rectum and sigmoid colon     4) Internal hemorrhoids           RECOMMENDATIONS:     1) Await pathology results     2) If the polyps removed today are adenomatous (pre-cancerous),     you will need a repeat colonoscopy in 5 years. Otherwise you     should continue to follow colorectal cancer screening guidelines  for "routine risk" patients with colonoscopy in 10 years.3)     Xifaxan 519m po tid, #30, no refills for chronic gas/bloating           Malcolm T. SFuller Plan MD, FMarval Regal          CC: AMerrilee Seashore MD           n.     REVISED:  12/22/2009 11:20 AM     eSIGNED:   MNorberto SorensonT. Stark at 12/22/2009 11:20 AM           KGraylon Good [id number] Note: An exclamation mark (!) indicates a result that was not dispersed into the flowsheet. Document Creation Date: 12/22/2009 11:22 AM _______________________________________________________________________  (1) Order result status: Final Collection or observation date-time: 12/22/2009 10:45 Requested date-time:  Receipt date-time:  Reported date-time:  Referring Physician:   Ordering Physician: MLucio Edward([id number] Specimen Source:  Source: ETawanna CoolerOrder Number: [id number] site:   Appended Document: Colonoscopy     Procedures Next Due Date:    Colonoscopy: 12/2014

## 2010-06-24 NOTE — Procedures (Signed)
Summary: EGD   EGD  Procedure date:  03/16/2006  Findings:      Location: [facility]     NAME:  [name], [name]          ACCOUNT NO.:  [id number]   MEDICAL RECORD NO.:  [id number]          PATIENT TYPE:  AMB   LOCATION:  ENDO                         FACILITY:  [facility]   PHYSICIAN:  Nelwyn Salisbury, M.D.  DATE OF BIRTH:  May 24, 1940   DATE OF PROCEDURE:  03/16/2006  DATE OF DISCHARGE:                                 OPERATIVE REPORT   PROCEDURE PERFORMED:  Esophagogastroduodenoscopy with small bowel and  gastric biopsies and snare polypectomy x3.   ENDOSCOPIST:  Nelwyn Salisbury, M.D.   INSTRUMENT USED:  Olympus video panendoscope.   INDICATIONS FOR PROCEDURE:  The patient is a 70 year old [place] male  undergoing an esophagogastroduodenoscopy for epigastric pain and  longstanding history of reflux.  He had an upper GI series that showed a  hiatal hernia and some prominent gastric folds.   PREPROCEDURE PREPARATION:  Informed consent was procured from the patient.  The patient was fasted for four hours prior to the procedure.  The risks and  benefits of the procedure were discussed with the patient in great detail.   PREPROCEDURE PHYSICAL:  The patient had stable vital signs.  Neck supple,  chest clear to auscultation.  S1, S2 regular.  Abdomen soft with normal  bowel sounds.   DESCRIPTION OF PROCEDURE:  The patient was placed in the left lateral  decubitus position and sedated with 50 mcg of fentanyl and 5 mg of Versed in  slow incremental doses.  Once the patient was adequately sedated and  maintained on low-flow oxygen and continuous cardiac monitoring, the Olympus  video panendoscope was advanced through the mouth piece over the tongue into  the esophagus under direct vision.  The entire esophagus appeared normal  with no evidence of ring, stricture, masses, esophagitis or Barrett's  mucosa.  The scope was then advanced to the stomach.  Four gastric polyps  were removed by hot snare from the proximal stomach.  One was ablated with  the tip of the snare as this was very small.  A small hiatal hernia was seen  on high retroflexion.  Antral biopsies were rule out presence of  Helicobacter pylori by pathology.  Prominent gastric folds were noted  especially in the proximal stomach.  Erosions were noted in the duodenal  bulb.  Small bowel biopsies were done to rule out sprue.  The patient  tolerated the procedure well without immediate complications.   IMPRESSION:  1. Small bowel biopsies done to rule out sprue.  2. Erosions noted in the duodenal bulb.  3. Prominent gastric fold, antral biopsies are done to rule out      Helicobacter pylori.  4. Four gastric polyps were removed by hot snare.  5. Small hiatal hernia.  6. Prominent fold biopsied in the proximal stomach (bottle #4).   RECOMMENDATIONS:  1. Await pathology results.  2. Avoid all nonsteroidals including aspirin for now.  3. Outpatient followup in the next two weeks, earlier if need be for      further  recommendations.  4. Continue proton pump inhibitor as recommended in the past.      Nelwyn Salisbury, M.D.  Electronically Signed     JNM/MEDQ  D:  03/16/2006  T:  03/17/2006  Job:  [id number]   cc:   Merrilee Seashore, M.D.

## 2010-06-24 NOTE — Letter (Signed)
Summary: Patient Notice- Polyp Results  [facility]  [address] [place], [place] [address]   Phone: [phone]  Fax: [phone]        December 24, 2009 MRN: [id number]    [name] [place], [place]  [address]    Dear [name],  I am pleased to inform you that the colon polyp(s) removed during your recent colonoscopy was (were) found to be benign (no cancer detected) upon pathologic examination.  I recommend you have a repeat colonoscopy examination in 5 years to look for recurrent polyps, as having colon polyps increases your risk for having recurrent polyps or even colon cancer in the future.  Should you develop new or worsening symptoms of abdominal pain, bowel habit changes or bleeding from the rectum or bowels, please schedule an evaluation with either your primary care physician or with me.  Continue treatment plan as outlined the day of your exam.  Please call us if you are having persistent problems or have questions about your condition that have not been fully answered at this time.  Sincerely,  Ladene Artist MD [facility]  This letter has been electronically signed by your physician.  Appended Document: Patient Notice- Polyp Results letter mailed

## 2010-10-08 NOTE — Op Note (Signed)
NAMEEDIN, KON NO.:  [id number]   MEDICAL RECORD NO.:  [id number]          PATIENT TYPE:  AMB   LOCATION:  ENDO                         FACILITY:  [facility]   PHYSICIAN:  Nelwyn Salisbury, M.D.  DATE OF BIRTH:  04/05/1941   DATE OF PROCEDURE:  03/16/2006  DATE OF DISCHARGE:                                 OPERATIVE REPORT   PROCEDURE PERFORMED:  Esophagogastroduodenoscopy with small bowel and  gastric biopsies and snare polypectomy x3.   ENDOSCOPIST:  Nelwyn Salisbury, M.D.   INSTRUMENT USED:  Olympus video panendoscope.   INDICATIONS FOR PROCEDURE:  The patient is a 70 year old [place] male  undergoing an esophagogastroduodenoscopy for epigastric pain and  longstanding history of reflux.  He had an upper GI series that showed a  hiatal hernia and some prominent gastric folds.   PREPROCEDURE PREPARATION:  Informed consent was procured from the patient.  The patient was fasted for four hours prior to the procedure.  The risks and  benefits of the procedure were discussed with the patient in great detail.   PREPROCEDURE PHYSICAL:  The patient had stable vital signs.  Neck supple,  chest clear to auscultation.  S1, S2 regular.  Abdomen soft with normal  bowel sounds.   DESCRIPTION OF PROCEDURE:  The patient was placed in the left lateral  decubitus position and sedated with 50 mcg of fentanyl and 5 mg of Versed in  slow incremental doses.  Once the patient was adequately sedated and  maintained on low-flow oxygen and continuous cardiac monitoring, the Olympus  video panendoscope was advanced through the mouth piece over the tongue into  the esophagus under direct vision.  The entire esophagus appeared normal  with no evidence of ring, stricture, masses, esophagitis or Barrett's  mucosa.  The scope was then advanced to the stomach.  Four gastric polyps  were removed by hot snare from the proximal stomach.  One was ablated with  the tip of the snare as this was  very small.  A small hiatal hernia was seen  on high retroflexion.  Antral biopsies were rule out presence of  Helicobacter pylori by pathology.  Prominent gastric folds were noted  especially in the proximal stomach.  Erosions were noted in the duodenal  bulb.  Small bowel biopsies were done to rule out sprue.  The patient  tolerated the procedure well without immediate complications.   IMPRESSION:  1. Small bowel biopsies done to rule out sprue.  2. Erosions noted in the duodenal bulb.  3. Prominent gastric fold, antral biopsies are done to rule out      Helicobacter pylori.  4. Four gastric polyps were removed by hot snare.  5. Small hiatal hernia.  6. Prominent fold biopsied in the proximal stomach (bottle #4).   RECOMMENDATIONS:  1. Await pathology results.  2. Avoid all nonsteroidals including aspirin for now.  3. Outpatient followup in the next two weeks, earlier if need be for      further recommendations.  4. Continue proton pump inhibitor as recommended in the past.      Nelwyn Salisbury, M.D.  Electronically  Signed     JNM/MEDQ  D:  03/16/2006  T:  03/17/2006  Job:  [id number]   cc:   Merrilee Seashore, M.D.

## 2010-10-08 NOTE — Op Note (Signed)
NAMEEMANI, MORAD NO.:  [id number]   MEDICAL RECORD NO.:  [id number]          PATIENT TYPE:  INP   LOCATION:  2930                         FACILITY:  [facility]   PHYSICIAN:  Derrill Kay, M.D. [facility] OF BIRTH:  26-Dec-1940   DATE OF PROCEDURE:  03/24/2004  DATE OF DISCHARGE:                                 OPERATIVE REPORT   PROCEDURE PERFORMED:  Therapeutic bronchoscopy.   INDICATIONS FOR PROCEDURE:  Mucous plugging in a patient with acute  respiratory distress syndrome on ventilator.   DESCRIPTION OF PROCEDURE:  The patient was sedated on the ventilator in the  intensive care unit being monitored in all aspects to include blood  pressure, heart rate, oxygen saturations and respirations.  The patient was  sedated with drips that are currently ongoing.  The patient had Portex  adaptor placed on his ventilator circuit.  The Pentax fiberoptic  bronchoscope was then advanced through the Portex adaptor.  Immediately in  the endotracheal tube, tenacious mucous plugs could be noted.  The  bronchoscope was advanced.  The endotracheal tube could be seen at 4 cm  above the carina.  There was excessive inflammation and irritation of the  trachea, carina and diffusely throughout the tracheobronchial tree on the  right and on the left.  No endobronchial lesions were noted.  There were  extensive mucous plugs bilaterally on left and right.  These were lavaged  with Mucomyst bicarbonate and saline combination all in equal parts.  Bronchoscope was then retrieved to allow the patient to recuperate from mild  desaturations.  The bronchoscope was then readvanced through the Portex  adaptor and extensive mucous plugs were retrieved from the right lower lobe  and left lower lobe subsegments.  Lavage was done until clear.  Again  excessive inflammation could be seen through the whole entire mucosa.   IMPRESSION:  Acute respiratory distress.  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   Plan will be to increase pulmonary toilet, continue ventilatory support.  Will also placed the patient on intravenous steroids.  Further plans pending  response to the above.   At the end of the procedure, the patient was stable and remained stable in  the ICU with ventilator settings set back to the original.  Procedure was  done on 100% FIO2.  FIO2 was then returned to 40% prior to termination of  same.       LG/MEDQ  D:  03/24/2004  T:  03/24/2004  Job:  [id number]

## 2010-10-08 NOTE — Op Note (Signed)
NAMEBRAILEN, [name] NO.:  [id number]   MEDICAL RECORD NO.:  [id number]          PATIENT TYPE:  INP   LOCATION:  9038                         FACILITY:  [facility]   PHYSICIAN:  Levy Sjogren, M.D.     DATE OF BIRTH:  [birth year]   DATE OF PROCEDURE:  03/16/2004  DATE OF DISCHARGE:                                 OPERATIVE REPORT   PREOPERATIVE DIAGNOSIS:  Altered mental status with fever.   POSTOPERATIVE DIAGNOSIS:  Altered mental status with fever.   OPERATION PERFORMED:  Lumbar puncture.   SURGEON:  Levy Sjogren, M.D.   INDICATIONS FOR PROCEDURE:  Suspicion for meningitis, given the patient's  fever and changed mental status.   DESCRIPTION OF PROCEDURE:  The patient was positively identified as the  intended target.  Verbal consent was obtained from the wife and patient  after explaining the need for the procedure.  The patient was placed in  right lateral position with his knees close to his abdomen in fetal  position.  The iliac crests were identified and the area of entry was also  identified and then sterilized and draped in sterile fashion.  Local  anesthesia with 1% Xylocaine was used.  Spinal tap needle was advanced  through skin, subcutaneous skin, fascia, ligament into meningeal space  without difficulty.  Clear fluid was obtained and two sample tubes were  obtained, one sent for cell count and protein and glucose.  The second was  sent for cultures and bacterial antigens.  The patient tolerated the  procedure well without problems.  He was kept flat position for two hours  after the procedure and was able to sit up afterward without issues.  There  was no immediate leak or hemorrhage in the area of the procedure.       SA/MEDQ  D:  03/16/2004  T:  03/16/2004  Job:  [id number]

## 2010-10-08 NOTE — Assessment & Plan Note (Signed)
DATE OF BIRTH:  May 18, 1941.   MEDICAL RECORD NUMBER:  Is #[id number].   HISTORY:  A 70 year old male with onset of bilateral lung infiltrates  progressing to respiratory failure necessitating intubation and mechanical  ventilation, onset March 16, 2004.  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  He was admitted to  rehab, on April 02, 2004, to continue strengthening, and he was  discharged on April 07, 2004.  He followed up with outpatient therapy and  has completed physical therapy at an independent leve.  He also went through  speech therapy and they felt speech and cognition were within functional  limits for his needs and no further evaluation was recommended.  He is  ambulating both on a treadmill when it is cold outside at about 15 minutes a  day, and when it is nice outside he will walk an additional mile a day.  He  does have some knee pain which he notes when he first stands up but after a  couple of minutes it goes away.  He also has foot pain mainly at night.  It  is a burning type of sensation bilateral feet distal to the ankles.  He does  notice some mild swelling of the bilateral lower extremities as well.  No  seizures.  No new cognitive issues.  He is ambulating on a treadmill 2.5  miles per hour 10-12 minutes specifically.   SOCIAL HISTORY:  Married.  He has not worked since his onset of pneumonia.   REVIEW OF SYSTEMS:  As above.  No headaches.  No weakness or numbness in the  legs.   PHYSICAL EXAMINATION:  VITAL SIGNS:  Blood pressure 123/77, pulse 63,  respirations 20, O2 sat 98% on room air.  GENERAL:  No acute distress.  Mood and affect appropriate.  MUSCULOSKELETAL:  His knees have no evidence of effusion.  He has no  evidence of ligamentous laxity.  No tenderness over the tendon or over the  knee cap.  Ankles,  he has mild edema  of the left foot and ankle area,  compared to the right side.  He has decreased left S1 sensation compared to  the right side.  He has intact ability for heel-walking, toe-walking, and  full strength bilateral lower extremities.  No deficits to pinprick noted.   IMPRESSION:  1.  History of deconditioning secondary to respiratory failure, prolonged      hospitalization.  2.  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  3.  Steroid psychosis/delirium, resolved.  4.  Patellar tendonitis.   PLAN:  1.  The patient already has some Celebrex at home.  I have advised him to      take one tablet twice a day for a solid week to help with his knee      pains.  2.  He has started nortriptyline 10 q.h.s. for his foot pain.  I advised      that recovery usually occurs over a 6-12 month period of time.  No EMG      necessary unless progressive symptoms.  3.  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      Erick Alley  D:  05/04/2004 12:51:41  T:  05/04/2004 14:22:51  Job #:  [id number]   cc:   Asencion Noble, M.D. [facility]

## 2010-10-08 NOTE — Discharge Summary (Signed)
NAMEASIER, DESROCHES NO.:  [id number]   MEDICAL RECORD NO.:  [id number]          PATIENT TYPE:  IPS   LOCATION:  8110                         FACILITY:  [facility]   PHYSICIAN:  Charlett Blake, M.D.DATE OF BIRTH:  December 24, 1940   DATE OF ADMISSION:  04/02/2004  DATE OF DISCHARGE:  04/07/2004                                 DISCHARGE SUMMARY   DISCHARGE DIAGNOSES:  1.  Acute respiratory distress syndrome with sepsis and legionella      pneumonia.  2.  Steroid myopathy.  3.  Hypertension.  4.  Hypothyroidism.  5.  History of tobacco abuse.  6.  Hypokalemia, resolved.   HISTORY OF PRESENT ILLNESS:  This 70 year old male has unremarkable past  medical history, admitted October 25 with diffuse body aches, altered mental  status and fever.  Lumbar puncture negative.  Cranial CT scan negative.  Chest x-ray with bilateral infiltrates.  Placed on intravenous antibiotics.  Bouts of tachycardia with orthostatic blood pressure changes.  Sodium 122.  Critical care medicine.  Acute respiratory distress.  Sepsis.  Noted  legionella pneumonia, maintained on mechanical ventilator and steroid  protocol.  Profound weakness after steroids, felt to be steroid myopathy.  Now off all antibiotics.  Steroid taper ongoing.  Blood pressure monitored.  Echocardiogram November 1 with ejection fraction 65-75%.  Mild delirium.  He  was tapered from his Seroquel, admitted for comprehensive rehabilitation  program.   PAST MEDICAL HISTORY:  See discharge diagnoses.   MEDICATIONS PRIOR TO ADMISSION:  Synthroid and Nexium.   ALLERGIES:  None.   SOCIAL HISTORY:  Lives with wife in [place].  He is a professor at [employer].  Two level home, bedroom upstairs.  Multiple entry steps.  Wife  to assist on discharge.   HOSPITAL COURSE:  Patient with progressive gains while on rehab services  with therapies initiated on a b.i.d. basis.  The following issues were  followed during the  patient's rehab course.  Pertaining to the patient's  acute respiratory distress with sepsis deconditioning, continued to do well  in all areas.  Steroid myopathy with steroid protocol since completed.  He  was now supervised for his ambulation.  Strength was 5/5 throughout.  Blood  pressures controlled with Lopressor.  He remained on his hormone supplement  for hypothyroidism.  He had a history of tobacco abuse that was discussed at  length and need for cessation of smoking.  Plan was to be discharged without  outpatient therapies.  Follow up neuropsychological testing with Dr. Antionette Poles in one month.  His delirium had essentially resolved.  His Seroquel  had been discontinued.  Full family teaching completed.  He was discharged  to home.   LABORATORY DATA:  Latest labs showed a sodium of 135, potassium 3.7,  hemoglobin 10.9, hematocrit 30.9.   DISCHARGE MEDICATIONS:  1.  Lopressor 25 mg b.i.d.  2.  Synthroid 100 mcg daily.  3.  Multivitamin daily.  4.  Nexium daily.  5.  He had been on subcutaneous Lovenox throughout his rehabilitation      course.  This was discontinued at discharge.  DISCHARGE INSTRUCTIONS:  1.  Diet:  Regular.  2.  Activity:  As tolerated.   SPECIAL INSTRUCTIONS:  No driving.  Outpatient therapies as advised.  He  would follow up with Dr. Letta Pate at the Outpatient Rehab Service office in  one month.       DA/MEDQ  D:  04/07/2004  T:  04/07/2004  Job:  [id number]   cc:   Asencion Noble, M.D. [facility]   [place], M.D.  Hospitalist

## 2010-10-08 NOTE — Consult Note (Signed)
[name], [name] NO.:  [id number]   MEDICAL RECORD NO.:  [id number]          PATIENT TYPE:  INP   LOCATION:  2930                         FACILITY:  [facility]   PHYSICIAN:  Asencion Noble, M.D. LHCDATE OF BIRTH:  05-15-1941   DATE OF CONSULTATION:  DATE OF DISCHARGE:                                   CONSULTATION   CHIEF COMPLAINT:  1.  Septic shock.  2.  Acute respiratory distress syndrome.   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  Then on October 25, he developed confusion. It was  worsening, and high fever.  He was brought to the emergency room.  He had a  lumbar puncture which was negative, bilateral infiltrates on chest x-ray  noted.  The patient was admitted to 3300, given Rocephin and Zithromax.  Through the night, he became worse with worse restlessness, low blood  pressure, tachycardia, persistent fever.  X-rays showed worsening bilateral  infiltrates.  The patient progressed to the point where he required transfer  to ICU and placed initially on bilevel and then intubated at 6 a.m.  Now in  progressive shock.  We were asked to see the patient for septic shock.   PAST MEDICAL HISTORY:  1.  History of hypercholesterolemia.  2.  Hypothyroidism.  3.  No other medical history.   SURGICAL HISTORY:  None.   MEDICATIONS:  Prior to admission:  1.  Synthroid.  2.  Tamiflu.  3.  Tylenol.   ALLERGIES:  None.   FAMILY HISTORY:  Coronary artery disease in father.   SOCIAL HISTORY:  A 20-pack a year smoker, occasional ethanol. Professor at  [employer].   REVIEW OF SYSTEMS:  Noncontributory.   PHYSICAL EXAMINATION:  VITAL SIGNS:  Temperature 103, pulse 130,  respirations 40, blood pressure 60/20.  GENERAL:  This patient is in acute distress, already orally intubated.  Poorly perfusing peripherally.  CHEST:  Bilateral rales, poor air movement, expired wheezes.  NEUROLOGIC:  The patient is unresponsive, agitated, delirious.  SKIN:  Cool.  Poor perfusion.  No edema.  PULSES:  The pulses are 2+ and symmetric.  ABDOMEN:  Soft, nontender, no organomegaly.  CARDIAC:  Resting tachycardiac without S3, normal S1 and S2.  No murmur.  HEENT:  No jugular venous distension or lymphadenopathy.  The patient is  orally intubated.  NEUROLOGIC:  Sedated on a ventilator, agitated, unresponsive.   LABORATORY DATA:  Chest x-ray, ARDS.  Bilateral infiltrates, right and left  pneumonia prior to the ARDS development.  White count 14,300, hemoglobin  15.8, platelet count 178,000.  Sodium 123, potassium 3.5, chloride 93, CO2  22.  BUN 10, creatinine 1.2.  Blood sugar 112.  AST 162.  ALT 78.  Bilirubin  1.5.   Cardiac enzymes satisfactory.  CK 37 35.  Calcium level is okay.  CSF level  is negative.   IMPRESSION:  1.  Severe sepsis with shock with acute respiratory distress syndrome      secondary to severe community-acquired pneumonia source  with multisystem      organ failure.  2.  Abnormal liver functions.  3.  Metabolic acidosis, TME.   PLAN:  1.  Will apply surviving sepsis campaign with 6 and 24 hour bundle.  See      orders.  This will include Xigris, low stretch ARDS protocol, early goal-      directed septic shock fluid resuscitation.  2.  Check and monitor lactate and cortisol levels.  3.  Maintain SVO2 greater than 70%, CVP greater than 12, MEP greater than 65      mmHg.  4.  Give IV hydrocortisone.  5.  Give broad spectrum antibiotics include vancomycin, broaden cultures to      obtain a BAL of the lungs and urine culture, along with the recent      already obtained blood cultures.  6.  Sedation protocols.  7.  Titrate light Levophed, kinetic therapy, intensive insulin therapy.  8.  Check ultrasound of the gallbladder, with abnormal LFT's.  9.  Will update family.  10. Place Panda tube  to obtain early nutrition.      Patr   PW/MEDQ  D:  03/17/2004  T:  03/17/2004  Job:  [id number]   cc:   Verdie Drown L. Gwynneth Aliment, M.D.

## 2010-10-08 NOTE — Discharge Summary (Signed)
NAMEEYOB, GODLEWSKI NO.:  [id number]   MEDICAL RECORD NO.:  [id number]          PATIENT TYPE:  INP   LOCATION:  2505                         FACILITY:  [facility]   PHYSICIAN:  Asencion Noble, M.D. LHCDATE OF BIRTH:  [birth year]   DATE OF ADMISSION:  03/16/2004  DATE OF DISCHARGE:  04/02/2004                           DISCHARGE SUMMARY - REFERRING   ADMISSION DIAGNOSIS:  He was admitted to [facility] for close monitoring, IV fluid resuscitation and work-up of  acute symptoms.   DISCHARGE DIAGNOSES:  1.  Severe steroid myopathy after severe septic shock and ventilator      dependent respiratory failure.  2.  Anemia.  3.  Diabetes type 2.   HISTORY OF PRESENT ILLNESS:  This is a 70 year old male patient of [place]  heritage who experienced flulike symptoms from March 12, 2004, to March 16, 2004, on the date of admission.  Symptoms included fever, headache and  body ache.  The patient was seen by physician who initiated Tamiflu  treatment in addition to Tylenol for symptomatic relief.  However, his  symptoms did not improve.  On March 15, 2004, his symptoms worsened.  He  developed hiccups and difficulty falling asleep.  On March 16, 2004, he  presented with restlessness and confusion, high fevers, spikes and  was  brought to the emergency room at [facility].  On  admission, he complained of achiness and general but nonspecific complaints.  He was noted to have acute mental status changes on admission.   PERTINENT LABORATORY DATA ON DISCHARGE:  Hemoglobin 9.3, white blood cell  count 7.4, platelet count 504.  His last potassium was 3.1, BUN 10,  creatinine 0.1 with glucose of 100.   His last chest x-ray showed bilateral alveolar opacification with some  asymmetric pulmonary edema that was done on March 30, 2004.   HOSPITAL COURSE BY DISCHARGE DIAGNOSIS:  PROBLEM #1 -  COMMUNITY ACQUIRED  PNEUMONIA WITH POSITIVE LEGIONELLA ANTIGEN IN THE URINE:  Patient was  originally treated with vancomycin, Rocephin and azithromycin; after  cultures were determined, Rifampin was added.  Antibiotics were discontinued  on March 23, 2004, and then resumed with vancomycin on March 24, 2004,  and Avelox on March 26, 2004, for fever spikes.  All antibiotics had been  discontinued on March 30, 2004.   PROBLEM #2 -  ADULT RESPIRATORY DISTRESS SYNDROME:  This was treated in the  intensive care setting on the day of admission.  The ARDS protocol was used  and started on March 17, 2004.  The patient required prone positioning for  oxygenation along with lung recruitment maneuvers every four hours during  the first two acute days.  Additional therapies for his ARDS including  neuromuscular blockade, heavy sedation which include the use of Diprivan,  Fentanyl and Versed.  Upon March 27, 2004, the weaning protocol was  initiated and the patient was liberated from mechanical ventilation on  March 29, 2004.   PROBLEM #3 -  SEPTIC SHOCK:  This was treated with the aforementioned  antibiotics on  intensive care monitoring.  Goal  directed volume  resuscitation.  Levophed for blood pressure management with protection of  end organ systems.   PROBLEM #4 -  ADRENAL INSUFFICIENCY:  This was treated with a seven-day  course of hydrocortisone.   PROBLEM #5 -  DIABETES TYPE 2:  Treated initially with critical care  protocol and then Lantus was added during his illness.  He is currently now  on sliding scale coverage with good glycemic control.   PROBLEM #6 -  ANEMIA:  This was treated with the Epogen protocol.  He now  has a stable hemoglobin of 9.3.   MEDICATIONS:  1.  Lopressor 25 mg p.o. twice a day.  2.  Novolog insulin h.s. coverage of 2 to 5 units depending on capillary      blood glucose.  3.  Potassium chloride 20 mEq p.o. daily.  4.  Ambien 10 mg p.o. q.h.s.  5.  Catapres 0.1 mg  p.o. b.i.d.  6.  Levothyroxine 100 mcg p.o. daily.  7.  Multivitamin p.o. daily.  8.  Lovenox 40 mg injection subcutaneously q.24h.  9.  Protonix 40 mg p.o. q.a.m.  10. Ferrous sulfate 325 mg p.o. t.i.d. with meals.  11. Sliding scale insulin 2 to 15 units before meals depending on capillary      blood glucose.  12. Ensure p.o. t.i.d.  13. Advair 250/50 one puff b.i.d.  14. The p.r.n. medications include:      1.  Bisacodyl suppositories p.r. p.r.n.      2.  Acetaminophen 650 mg p.o. q.4h. p.r.n.   DIET:  Upon discharge his diet is as tolerated with no concentrated sweets.   He will be transferring to the [facility] and we will be available for further follow-up.   His examination upon admission is improvement.  He is currently on room air  and looking forward to rehabilitation therapy.      Pete   PB/MEDQ  D:  04/02/2004  T:  04/02/2004  Job:  [id number]   cc:   Darcus Austin, D.O.  [address]  [place]  [place] [address]  Fax: [place].

## 2010-10-08 NOTE — H&P (Signed)
[name], [name] NO.:  [id number]   MEDICAL RECORD NO.:  [id number]          PATIENT TYPE:  INP   LOCATION:  1155                         FACILITY:  [facility]   PHYSICIAN:  Levy Sjogren, M.D.     DATE OF BIRTH:  Sep 21, 1940   DATE OF ADMISSION:  03/16/2004  DATE OF DISCHARGE:                                HISTORY & PHYSICAL   REASON FOR ADMISSION:  Fever and change in mental status.   HISTORY OF PRESENT ILLNESS:  The patient is a very pleasant 70 year old male  from [place] who has been experiencing symptoms of flu since Friday,  March 12, 2004 including fevers, headaches and body aches.  The patient  went to see his physician who initiated Tamiflu treatment in addition to  Tylenol for symptomatic relief however, the patient did not get any better.  Yesterday, the patient started to get worse.  He used more Tylenol,  developed hiccups and was difficult falling asleep.  Today, the patient is  restless and confused in addition to spiking high fevers so he presented to  the emergency room.  At this time, the patient complained of achiness in  general but no specific complaints.  His mental status certainly precluding  from giving a detailed history.   PAST MEDICAL HISTORY:  Hypothyroidism.   PAST SURGICAL HISTORY:  None.   FAMILY HISTORY:  Positive for coronary artery disease in his father.   SOCIAL HISTORY:  He has a 20 pack year history of smoking, continued to  smoke about a half a pack a day.  Occasional alcohol use.  He is a college  professor locally.  He lives with his wife of 9 years.  He has two  children, one of them is a radiation oncologist in [facility].  His daughter is an Psychologist, prison and probation services in [place].   ALLERGIES:  No known drug allergies.   HOME MEDICATIONS:  1.  Synthroid, unknown dose.  The patient claims it to be 175 mcg but he is      unsure.  2.  Tylenol.  3.  Tamiflu.   REVIEW OF SYSTEMS:   CONSTITUTIONAL:  Positive for fever and sweating.  No  chills.  No weight changes.  Positive for a general restlessness.  GASTROINTESTINAL:  No nausea and vomiting.  No diarrhea or constipation.  GENITOURINARY:  No dysuria, hematuria or retention.  CARDIOPULMONARY:  No  chest pain, shortness of breath, no orthopnea, PND but positive for  occasional cough which was dry.   PHYSICAL EXAMINATION:  GENERAL:  The patient appeared restless though not in  major distress.  He is tachypneic.  He is alert and oriented x3 although  answers to many questions are inappropriate.  Also having problems following  commands occasionally.  VITAL SIGNS:  Temperature 103.7, heart rate 158, respiratory rate of 40,  blood pressure 153/77.  HEENT:  The head is atraumatic and normocephalic.  Pupils are equal, round,  reactive to light and accommodation.  Extraocular movements are intact  bilaterally.  Mucous membranes are dry but without lesions.  NECK:  No lymphadenopathy, no thyromegaly, no JVD.  CHEST:  Clear to auscultation bilaterally.  No wheezing.  HEART:  Regular rhythm, tachycardia.  No murmurs.  ABDOMEN: Soft, nontender, nondistended.  Hypoactive bowel sounds.  No  organomegaly.  EXTREMITIES:  No cyanosis, edema or clubbing.  NEUROLOGICAL:  Cranial nerves were grossly intact.  Muscular strength was  symmetrical and equal in all four extremities.  The patient has no meningeal  signs.  SKIN:  The patient shows no rash anywhere on his body.   STUDIES:  CBC showed an elevated white count of 14.3.  Differential showed  that 95% of the cells are neutrophils.  Hemoglobin was elevated at 15.8,  normal platelets at 178,000.  Electrolytes showed very low sodium at 115,  low chloride at 83, normal potassium and renal function, his bicarb was  mildly low at 20.   LFT's showed low total sodium at 5.7, low albumin at 2.8, elevated AST at  162, elevated ALT at 78, elevated bilirubin at 1.5.  Acetaminophen level  was  low, less than 10.  Head CT scan was grossly negative with no hemorrhage or  mass effect although the patient moved during the CT scan and the quality  was low.  EKG is pending at this time.  Chest x-ray did not show any  definite infiltrates.   ASSESSMENT:  1.  Severe hyponatremia.  2.  Viral syndrome.  3.  Questionable viral meningitis.  4.  Hepatitis, unknown origin.  5.  Hypothyroidism.   PLAN:  1.  Admit to step down unit for close monitoring.  2.  Start IV fluids with the slow replacement of sodium.  3.  Monitor sodium level every four hours with objective of correcting      sodium by a half mg per hour on average.  4.  Start Rocephin and azithromycin pending blood culture.  5.  Check urinalysis.  6.  Check cardiac enzymes and EKG.  7.  Check viral hepatitis panel.  8.  Check ABG and urine drug screen.   PRIMARY CARE PHYSICIAN:  Dr. Rachel Moulds.      SA/MEDQ  D:  03/16/2004  T:  03/16/2004  Job:  [id number]   cc:   Darcus Austin, D.O.  [address]  [place]  [place] [address]  Fax: [phone]

## 2012-09-04 ENCOUNTER — Other Ambulatory Visit: Payer: Self-pay | Admitting: Internal Medicine

## 2012-09-04 ENCOUNTER — Ambulatory Visit
Admission: RE | Admit: 2012-09-04 | Discharge: 2012-09-04 | Disposition: A | Discharge status: Home / Self Care | Payer: BC Managed Care – PPO | Source: Ambulatory Visit | Attending: Internal Medicine | Admitting: Internal Medicine

## 2012-09-04 DIAGNOSIS — R52 Pain, unspecified: Secondary | ICD-10-CM

## 2012-09-06 ENCOUNTER — Other Ambulatory Visit: Payer: Self-pay | Admitting: Podiatry

## 2013-06-17 ENCOUNTER — Other Ambulatory Visit: Payer: Self-pay | Admitting: Internal Medicine

## 2013-06-17 DIAGNOSIS — M545 Low back pain, unspecified: Secondary | ICD-10-CM

## 2013-06-21 ENCOUNTER — Ambulatory Visit
Admission: RE | Admit: 2013-06-21 | Discharge: 2013-06-21 | Disposition: A | Discharge status: Home / Self Care | Payer: BC Managed Care – PPO | Source: Ambulatory Visit | Attending: Internal Medicine | Admitting: Internal Medicine

## 2013-06-21 DIAGNOSIS — M545 Low back pain, unspecified: Secondary | ICD-10-CM

## 2013-06-26 ENCOUNTER — Other Ambulatory Visit: Payer: Self-pay | Admitting: Internal Medicine

## 2013-06-26 DIAGNOSIS — M5410 Radiculopathy, site unspecified: Secondary | ICD-10-CM

## 2013-06-26 DIAGNOSIS — IMO0002 Reserved for concepts with insufficient information to code with codable children: Secondary | ICD-10-CM

## 2013-06-28 ENCOUNTER — Ambulatory Visit
Admission: RE | Admit: 2013-06-28 | Discharge: 2013-06-28 | Disposition: A | Discharge status: Home / Self Care | Payer: BC Managed Care – PPO | Source: Ambulatory Visit | Attending: Internal Medicine | Admitting: Internal Medicine

## 2013-06-28 VITALS — BP 146/80 | HR 98

## 2013-06-28 DIAGNOSIS — IMO0002 Reserved for concepts with insufficient information to code with codable children: Secondary | ICD-10-CM

## 2013-06-28 DIAGNOSIS — R12 Heartburn: Secondary | ICD-10-CM

## 2013-06-28 DIAGNOSIS — M5410 Radiculopathy, site unspecified: Secondary | ICD-10-CM

## 2013-06-28 MED ORDER — IOHEXOL 180 MG/ML  SOLN
1.0000 mL | Freq: Once | INTRAMUSCULAR | Status: AC | PRN
  Administered 2013-06-28: 1 mL via EPIDURAL

## 2013-06-28 MED ORDER — METHYLPREDNISOLONE ACETATE 40 MG/ML INJ SUSP (RADIOLOG
120.0000 mg | Freq: Once | INTRAMUSCULAR | Status: AC
  Administered 2013-06-28: 120 mg via EPIDURAL

## 2013-06-28 NOTE — Discharge Instructions (Signed)

## 2013-07-10 ENCOUNTER — Other Ambulatory Visit: Payer: Self-pay | Admitting: Internal Medicine

## 2013-07-10 ENCOUNTER — Telehealth: Payer: Self-pay

## 2013-07-10 DIAGNOSIS — M545 Low back pain: Secondary | ICD-10-CM

## 2013-07-10 DIAGNOSIS — M546 Pain in thoracic spine: Principal | ICD-10-CM

## 2013-07-10 NOTE — Telephone Encounter (Signed)
Asked wife to have husband call me when he returned from mechanic so I could follow-up on report from Dr. Ashby Dawes that patient having increased back pain and positional headaches since LESI 06/28/13 with Dr. Barbie Banner.  jkl

## 2013-07-12 ENCOUNTER — Ambulatory Visit
Admission: RE | Admit: 2013-07-12 | Discharge: 2013-07-12 | Disposition: A | Discharge status: Home / Self Care | Payer: BC Managed Care – PPO | Source: Ambulatory Visit | Attending: Internal Medicine | Admitting: Internal Medicine

## 2013-07-12 DIAGNOSIS — M546 Pain in thoracic spine: Principal | ICD-10-CM

## 2013-07-12 DIAGNOSIS — M545 Low back pain: Secondary | ICD-10-CM

## 2014-03-14 ENCOUNTER — Other Ambulatory Visit (HOSPITAL_COMMUNITY): Payer: Self-pay | Admitting: Interventional Cardiology

## 2014-03-14 ENCOUNTER — Ambulatory Visit (HOSPITAL_COMMUNITY)
Admission: RE | Admit: 2014-03-14 | Discharge: 2014-03-14 | Disposition: A | Discharge status: Home / Self Care | Payer: BC Managed Care – PPO | Source: Ambulatory Visit | Attending: Internal Medicine | Admitting: Internal Medicine

## 2014-03-14 DIAGNOSIS — R0602 Shortness of breath: Secondary | ICD-10-CM | POA: Diagnosis present

## 2014-03-14 DIAGNOSIS — M7989 Other specified soft tissue disorders: Secondary | ICD-10-CM | POA: Insufficient documentation

## 2014-03-14 DIAGNOSIS — I519 Heart disease, unspecified: Secondary | ICD-10-CM

## 2014-03-14 NOTE — Progress Notes (Signed)
  Echocardiogram 2D Echocardiogram has been performed.  Darlina Sicilian M 03/14/2014, 2:50 PM

## 2014-03-31 DIAGNOSIS — M8580 Other specified disorders of bone density and structure, unspecified site: Secondary | ICD-10-CM | POA: Insufficient documentation

## 2014-04-15 ENCOUNTER — Ambulatory Visit: Payer: BC Managed Care – PPO | Admitting: Interventional Cardiology

## 2014-04-22 ENCOUNTER — Ambulatory Visit: Payer: BC Managed Care – PPO | Admitting: Interventional Cardiology

## 2014-08-14 ENCOUNTER — Other Ambulatory Visit: Payer: Self-pay | Admitting: Internal Medicine

## 2014-08-14 DIAGNOSIS — G8929 Other chronic pain: Secondary | ICD-10-CM

## 2014-08-14 DIAGNOSIS — M545 Low back pain: Principal | ICD-10-CM

## 2014-08-15 ENCOUNTER — Ambulatory Visit
Admission: RE | Admit: 2014-08-15 | Discharge: 2014-08-15 | Disposition: A | Discharge status: Home / Self Care | Payer: BC Managed Care – PPO | Source: Ambulatory Visit | Attending: Internal Medicine | Admitting: Internal Medicine

## 2014-08-15 DIAGNOSIS — M545 Low back pain, unspecified: Secondary | ICD-10-CM

## 2014-08-15 DIAGNOSIS — G8929 Other chronic pain: Secondary | ICD-10-CM

## 2014-08-15 MED ORDER — IOHEXOL 180 MG/ML  SOLN
1.0000 mL | Freq: Once | INTRAMUSCULAR | Status: AC | PRN
  Administered 2014-08-15: 1 mL via EPIDURAL

## 2014-08-15 MED ORDER — METHYLPREDNISOLONE ACETATE 40 MG/ML INJ SUSP (RADIOLOG
120.0000 mg | Freq: Once | INTRAMUSCULAR | Status: AC
  Administered 2014-08-15: 120 mg via EPIDURAL

## 2014-08-15 NOTE — Discharge Instructions (Signed)

## 2014-10-23 ENCOUNTER — Other Ambulatory Visit: Payer: Self-pay | Admitting: Internal Medicine

## 2014-10-23 DIAGNOSIS — M545 Low back pain, unspecified: Secondary | ICD-10-CM

## 2014-10-23 DIAGNOSIS — G8929 Other chronic pain: Secondary | ICD-10-CM

## 2014-10-24 ENCOUNTER — Ambulatory Visit
Admission: RE | Admit: 2014-10-24 | Discharge: 2014-10-24 | Disposition: A | Discharge status: Home / Self Care | Payer: BC Managed Care – PPO | Source: Ambulatory Visit | Attending: Internal Medicine | Admitting: Internal Medicine

## 2014-10-24 DIAGNOSIS — M545 Low back pain: Principal | ICD-10-CM

## 2014-10-24 DIAGNOSIS — G8929 Other chronic pain: Secondary | ICD-10-CM

## 2014-10-24 MED ORDER — METHYLPREDNISOLONE ACETATE 40 MG/ML INJ SUSP (RADIOLOG
120.0000 mg | Freq: Once | INTRAMUSCULAR | Status: AC
  Administered 2014-10-24: 120 mg via EPIDURAL

## 2014-10-24 MED ORDER — IOHEXOL 180 MG/ML  SOLN
1.0000 mL | Freq: Once | INTRAMUSCULAR | Status: AC | PRN
  Administered 2014-10-24: 1 mL via EPIDURAL

## 2014-10-24 NOTE — Discharge Instructions (Signed)

## 2014-10-27 DIAGNOSIS — M170 Bilateral primary osteoarthritis of knee: Secondary | ICD-10-CM | POA: Insufficient documentation

## 2014-11-17 ENCOUNTER — Encounter: Payer: Self-pay | Admitting: Gastroenterology

## 2014-12-19 DIAGNOSIS — Z9660 Presence of unspecified orthopedic joint implant: Secondary | ICD-10-CM | POA: Insufficient documentation

## 2014-12-19 DIAGNOSIS — Z9889 Other specified postprocedural states: Secondary | ICD-10-CM | POA: Insufficient documentation

## 2015-01-29 ENCOUNTER — Other Ambulatory Visit: Payer: Self-pay | Admitting: Internal Medicine

## 2015-01-29 DIAGNOSIS — M5136 Other intervertebral disc degeneration, lumbar region: Secondary | ICD-10-CM

## 2015-02-06 ENCOUNTER — Encounter: Payer: Self-pay | Admitting: Radiology

## 2015-02-06 ENCOUNTER — Ambulatory Visit
Admission: RE | Admit: 2015-02-06 | Discharge: 2015-02-06 | Disposition: A | Discharge status: Home / Self Care | Payer: BC Managed Care – PPO | Source: Ambulatory Visit | Attending: Internal Medicine | Admitting: Internal Medicine

## 2015-02-06 DIAGNOSIS — M5136 Other intervertebral disc degeneration, lumbar region: Secondary | ICD-10-CM

## 2015-02-06 MED ORDER — IOHEXOL 180 MG/ML  SOLN
1.0000 mL | Freq: Once | INTRAMUSCULAR | Status: DC | PRN
  Administered 2015-02-06: 1 mL via EPIDURAL

## 2015-02-06 MED ORDER — METHYLPREDNISOLONE ACETATE 40 MG/ML INJ SUSP (RADIOLOG
120.0000 mg | Freq: Once | INTRAMUSCULAR | Status: AC
  Administered 2015-02-06: 120 mg via EPIDURAL

## 2015-02-06 NOTE — Discharge Instructions (Signed)

## 2015-07-15 ENCOUNTER — Other Ambulatory Visit: Payer: Self-pay | Admitting: Rheumatology

## 2015-07-15 DIAGNOSIS — M545 Low back pain, unspecified: Secondary | ICD-10-CM

## 2015-07-15 DIAGNOSIS — G8929 Other chronic pain: Secondary | ICD-10-CM

## 2015-07-31 ENCOUNTER — Encounter: Payer: Self-pay | Admitting: Radiology

## 2015-07-31 ENCOUNTER — Other Ambulatory Visit: Payer: Self-pay | Admitting: Rheumatology

## 2015-07-31 ENCOUNTER — Ambulatory Visit
Admission: RE | Admit: 2015-07-31 | Discharge: 2015-07-31 | Disposition: A | Discharge status: Home / Self Care | Payer: BC Managed Care – PPO | Source: Ambulatory Visit | Attending: Rheumatology | Admitting: Rheumatology

## 2015-07-31 DIAGNOSIS — M545 Low back pain, unspecified: Secondary | ICD-10-CM

## 2015-07-31 DIAGNOSIS — G8929 Other chronic pain: Secondary | ICD-10-CM

## 2015-07-31 MED ORDER — IOHEXOL 180 MG/ML  SOLN
1.0000 mL | Freq: Once | INTRAMUSCULAR | Status: AC | PRN
  Administered 2015-07-31: 1 mL via EPIDURAL

## 2015-07-31 MED ORDER — METHYLPREDNISOLONE ACETATE 40 MG/ML INJ SUSP (RADIOLOG
120.0000 mg | Freq: Once | INTRAMUSCULAR | Status: AC
  Administered 2015-07-31: 120 mg via EPIDURAL

## 2015-07-31 NOTE — Discharge Instructions (Signed)

## 2015-08-31 ENCOUNTER — Emergency Department (HOSPITAL_COMMUNITY)
Admission: EM | Admit: 2015-08-31 | Discharge: 2015-08-31 | Disposition: A | Discharge status: Home / Self Care | Payer: BC Managed Care – PPO | Attending: Emergency Medicine | Admitting: Emergency Medicine

## 2015-08-31 ENCOUNTER — Emergency Department (HOSPITAL_COMMUNITY): Payer: BC Managed Care – PPO

## 2015-08-31 ENCOUNTER — Encounter (HOSPITAL_COMMUNITY): Payer: Self-pay | Admitting: Emergency Medicine

## 2015-08-31 DIAGNOSIS — R42 Dizziness and giddiness: Secondary | ICD-10-CM | POA: Diagnosis not present

## 2015-08-31 DIAGNOSIS — Z79899 Other long term (current) drug therapy: Secondary | ICD-10-CM | POA: Insufficient documentation

## 2015-08-31 DIAGNOSIS — E079 Disorder of thyroid, unspecified: Secondary | ICD-10-CM | POA: Diagnosis not present

## 2015-08-31 DIAGNOSIS — R110 Nausea: Secondary | ICD-10-CM | POA: Diagnosis not present

## 2015-08-31 DIAGNOSIS — Z87891 Personal history of nicotine dependence: Secondary | ICD-10-CM | POA: Diagnosis not present

## 2015-08-31 DIAGNOSIS — R002 Palpitations: Secondary | ICD-10-CM | POA: Insufficient documentation

## 2015-08-31 DIAGNOSIS — M1990 Unspecified osteoarthritis, unspecified site: Secondary | ICD-10-CM | POA: Insufficient documentation

## 2015-08-31 DIAGNOSIS — E119 Type 2 diabetes mellitus without complications: Secondary | ICD-10-CM | POA: Insufficient documentation

## 2015-08-31 DIAGNOSIS — Z791 Long term (current) use of non-steroidal anti-inflammatories (NSAID): Secondary | ICD-10-CM | POA: Insufficient documentation

## 2015-08-31 DIAGNOSIS — K219 Gastro-esophageal reflux disease without esophagitis: Secondary | ICD-10-CM | POA: Insufficient documentation

## 2015-08-31 DIAGNOSIS — Z8619 Personal history of other infectious and parasitic diseases: Secondary | ICD-10-CM | POA: Diagnosis not present

## 2015-08-31 DIAGNOSIS — Z7984 Long term (current) use of oral hypoglycemic drugs: Secondary | ICD-10-CM | POA: Insufficient documentation

## 2015-08-31 DIAGNOSIS — I10 Essential (primary) hypertension: Secondary | ICD-10-CM | POA: Diagnosis not present

## 2015-08-31 DIAGNOSIS — E785 Hyperlipidemia, unspecified: Secondary | ICD-10-CM | POA: Diagnosis not present

## 2015-08-31 LAB — BASIC METABOLIC PANEL
ANION GAP: 10 (ref 5–15)
BUN: 11 mg/dL (ref 6–20)
CHLORIDE: 107 mmol/L (ref 101–111)
CO2: 20 mmol/L — AB (ref 22–32)
Calcium: 10 mg/dL (ref 8.9–10.3)
Creatinine, Ser: 0.8 mg/dL (ref 0.61–1.24)
GFR calc non Af Amer: >60 mL/min (ref >60)
GLUCOSE: 193 mg/dL — AB (ref 65–99)
POTASSIUM: 4 mmol/L (ref 3.5–5.1)
Sodium: 137 mmol/L (ref 135–145)

## 2015-08-31 LAB — CBC WITH DIFFERENTIAL/PLATELET
BASOS ABS: 0.1 10*3/uL (ref 0.0–0.1)
BASOS PCT: 1 %
Eosinophils Absolute: 0.1 10*3/uL (ref 0.0–0.7)
Eosinophils Relative: 2 %
HEMATOCRIT: 38.9 % — AB (ref 39.0–52.0)
HEMOGLOBIN: 13.1 g/dL (ref 13.0–17.0)
LYMPHS PCT: 24 %
Lymphs Abs: 1.7 10*3/uL (ref 0.7–4.0)
MCH: 30.3 pg (ref 26.0–34.0)
MCHC: 33.7 g/dL (ref 30.0–36.0)
MCV: 90 fL (ref 78.0–100.0)
Monocytes Absolute: 0.3 10*3/uL (ref 0.1–1.0)
Monocytes Relative: 5 %
NEUTROS ABS: 4.9 10*3/uL (ref 1.7–7.7)
NEUTROS PCT: 70 %
Platelets: 217 10*3/uL (ref 150–400)
RBC: 4.32 MIL/uL (ref 4.22–5.81)
RDW: 18.9 % — ABNORMAL HIGH (ref 11.5–15.5)
WBC: 7.1 10*3/uL (ref 4.0–10.5)

## 2015-08-31 LAB — I-STAT TROPONIN, ED
TROPONIN I, POC: 0.01 ng/mL (ref 0.00–0.08)
TROPONIN I, POC: 0.01 ng/mL (ref 0.00–0.08)

## 2015-08-31 LAB — T4, FREE: FREE T4: 1.23 ng/dL — AB (ref 0.61–1.12)

## 2015-08-31 LAB — TSH: TSH: 1.368 u[IU]/mL (ref 0.350–4.500)

## 2015-08-31 LAB — MAGNESIUM: MAGNESIUM: 1.5 mg/dL — AB (ref 1.7–2.4)

## 2015-08-31 MED ORDER — MAGNESIUM OXIDE 400 (241.3 MG) MG PO TABS
400.0000 mg | ORAL_TABLET | Freq: Once | ORAL | Status: AC
  Administered 2015-08-31: 400 mg via ORAL
  Filled 2015-08-31: qty 1

## 2015-08-31 NOTE — Discharge Instructions (Signed)

## 2015-08-31 NOTE — ED Notes (Signed)
EDP at bedside  

## 2015-08-31 NOTE — ED Notes (Signed)
[place] for pt to have PO fluids per EDP

## 2015-08-31 NOTE — ED Provider Notes (Signed)
CSN: [id number]     Arrival date & time 08/31/15  1418 History   First MD Initiated Contact with Patient 08/31/15 1501     Chief Complaint  Patient presents with  . Atrial Fibrillation     (Consider location/radiation/quality/duration/timing/severity/associated sxs/prior Treatment) The history is provided by the patient.  75 y.o. male with a history of HTN, HLD, hypothyroidism on Synthroid medication changes, diabetes with recent negative stress test in December presents to the emergency department noting the onset of lightheadedness with standing around 11:30 AM, checked his blood pressure and found his heart rate to be irregular in the 160s. He denied any sensation of chest pain, palpitations, nausea, vomiting, chest tightness. He denies any history of prior A. fib. He denies any recent significant caffeine intake, changes in his diet, changes in his medication regimen. EMS was called and on arrival found him to have A. fib with RVR on the monitor with rates in the 160s. While in route after getting no medications the patient converted to sinus tach with a heart rate in the low 100s, then changing to his baseline rate of around 90.Marland Kitchen He's had no further symptoms.   Past Medical History  Diagnosis Date  . Thyroid disease   . Osteopenia   . Hypertension   . DM (diabetes mellitus) ([place])   . Hyperlipidemia   . Osteoarthritis   . GERD (gastroesophageal reflux disease)   . 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 No pertinent past surgical history. Family History  Problem Relation Age of Onset  . Diabetes Mellitus I Father   . Diabetes Mellitus I Mother   . CAD Brother    Social History  Substance Use Topics  . Smoking status: Former Smoker    Types: Cigarettes  . Smokeless tobacco: None  . Alcohol Use: 0.0 oz/week    0 Standard drinks or equivalent per week     Comment: "once in a while"    Review of Systems  Constitutional: Positive for activity change. Negative for  fever and chills.  HENT: Negative for congestion, rhinorrhea and sinus pressure.   Respiratory: Negative for cough, chest tightness and shortness of breath.   Cardiovascular: Positive for palpitations. Negative for chest pain and leg swelling.  Gastrointestinal: Positive for nausea. Negative for vomiting and abdominal pain.  Musculoskeletal: Negative for back pain and neck pain.  Skin: Negative for rash.  Neurological: Positive for light-headedness. Negative for dizziness, syncope, weakness, numbness and headaches.  All other systems reviewed and are negative.     Allergies  Hydrocodone  Home Medications   Prior to Admission medications   Medication Sig Start Date End Date Taking? Authorizing Provider  amLODipine (NORVASC) 5 MG tablet Take 5 mg by mouth daily.   Yes Historical Provider, MD  celecoxib (CELEBREX) 200 MG capsule Take 200 mg by mouth daily after supper.   Yes Historical Provider, MD  esomeprazole (NEXIUM) 40 MG capsule Take 40 mg by mouth daily after supper.  08/01/15  Yes Historical Provider, MD  irbesartan (AVAPRO) 150 MG tablet Take 150 mg by mouth daily.  10/25/14  Yes Historical Provider, MD  levothyroxine (SYNTHROID, LEVOTHROID) 75 MCG tablet Take 75 mcg by mouth daily before breakfast.   Yes Historical Provider, MD  metFORMIN (GLUCOPHAGE-XR) 500 MG 24 hr tablet Take 1,000 mg by mouth daily after supper.  08/21/15  Yes Historical Provider, MD  METHOTREXATE SODIUM IJ Inject into the skin every Saturday.   Yes Historical Provider, MD  Multiple Vitamin (MULTIVITAMIN  WITH MINERALS) TABS tablet Take 1 tablet by mouth daily.   Yes Historical Provider, MD  rosuvastatin (CRESTOR) 10 MG tablet Take 10 mg by mouth daily. 08/28/15  Yes Historical Provider, MD   BP 119/76 mmHg  Pulse 78  Temp(Src) 98.7 F (37.1 C) (Oral)  Resp 12  Ht 5' 6"  (1.676 m)  Wt 64.864 kg  BMI 23.09 kg/m2  SpO2 98% Physical Exam  Constitutional: He is oriented to person, place, and time. He appears  well-developed and well-nourished. No distress.  HENT:  Head: Normocephalic and atraumatic.  Nose: Nose normal.  Mouth/Throat: Oropharynx is clear and moist.  Eyes: Conjunctivae and EOM are normal. Pupils are equal, round, and reactive to light.  Neck: Normal range of motion.  Cardiovascular: Normal rate, regular rhythm, normal heart sounds and intact distal pulses.   Pulmonary/Chest: Effort normal and breath sounds normal.  Abdominal: Soft. There is no tenderness.  Musculoskeletal: He exhibits no edema or tenderness.  Neurological: He is alert and oriented to person, place, and time.  Skin: Skin is warm and dry. He is not diaphoretic.  Nursing note and vitals reviewed.   ED Course  Procedures (including critical care time) Labs Review Labs Reviewed  CBC WITH DIFFERENTIAL/PLATELET - Abnormal; Notable for the following:    HCT 38.9 (*)    RDW 18.9 (*)    All other components within normal limits  BASIC METABOLIC PANEL - Abnormal; Notable for the following:    CO2 20 (*)    Glucose, Bld 193 (*)    All other components within normal limits  MAGNESIUM - Abnormal; Notable for the following:    Magnesium 1.5 (*)    All other components within normal limits  T4, FREE - Abnormal; Notable for the following:    Free T4 1.23 (*)    All other components within normal limits  TSH  I-STAT TROPOININ, ED  Randolm Idol, ED    Imaging Review Dg Chest 2 View  08/31/2015  CLINICAL DATA:  Palpitations. EXAM: CHEST  2 VIEW COMPARISON:  01/15/2015 FINDINGS: The heart size and mediastinal contours are within normal limits. The visualized skeletal structures are unremarkable. Calcification in the arch of the aorta. Minimal linear scarring or atelectasis at the right base. The lungs are otherwise clear. IMPRESSION: Minimal linear atelectasis at the right lung base. Aortic atherosclerosis. Otherwise, normal exam. Electronically Signed   By: Lorriane Shire M.D.   On: 08/31/2015 16:27   I have  personally reviewed and evaluated these images and lab results as part of my medical decision-making.   EKG Interpretation   Date/Time:  Monday August 31 2015 14:22:50 EDT Ventricular Rate:  100 PR Interval:  148 QRS Duration: 83 QT Interval:  317 QTC Calculation: 409 R Axis:   65 Text Interpretation:  Sinus tachycardia Low voltage, precordial leads No  old tracing to compare Confirmed by FLOYD MD, DANIEL [phone]) on 08/31/2015  3:03:38 PM      MDM  75 y.o. male presents with a chief complaint of palpitations. On arrival the patient is awake alert and oriented 4 with vital signs stable. Physical exam unremarkable, as above. Feel this is likely paroxysmal atrial fibrillation. EKG was done and showed NSR with no evidence of acute ST or T-wave abnormalities suggestive of ischemia. Given his significant comorbidities, labs were drawn and chest x-ray was ordered. Chest x-ray showed no acute cardiopulmonary abnormality. Labs resulted initially reassuringly with negative troponin, magnesium slightly low at 1.5, given by mouth magnesium oxide. Thyroid  studies and a troponin resulted showing normal TSH, mildly elevated t4. Delta troponin was negative. He remained HDS with no further arrhythmia noted in the ED. He was recommended to follow up closely with his cardiologist for further evaluation of his intermittent palpitations suggestive of a.fib with RVR. This plan was discussed with the patient at the bedside and he stated both understanding and agreement with this plan.   Final diagnoses:  Intermittent palpitations        Zenovia Jarred, DO 09/01/15 [place], DO 09/01/15 1337

## 2015-08-31 NOTE — ED Notes (Signed)
Patient asked if he could eat. Called EDP, Dr. Tyrone Nine said he could. Gave patient the go to eat, patient and his wife agreed he would eat when he gets home being that he is a vegetarian. Patient also requested for the blood work that was scheduled at 6pm, to be done. Notified Brooke, RN of the same and she is currently at bedside drawing blood on patient, with Hennie Duos - phleb at bedside.

## 2015-08-31 NOTE — ED Notes (Signed)
Notified patient's wife of patient's location. Permission given to call wife by patient.

## 2015-08-31 NOTE — ED Notes (Signed)
Pt verbalized understanding of d/c instructions and follow-up care. No further questions/concerns, VSS, ambulatory w/ steady gait (refused wheelchair) 

## 2015-08-31 NOTE — ED Notes (Signed)
Pt went to urgent care feeling lightheaded dizzy, chest palpatations. EMS reports Afib on monitor rates 160's. EMS sat pt up in bed and he converted to ST HR 110. Pt feeling better. BP 118/60. No hx of Afib

## 2015-08-31 NOTE — ED Notes (Signed)
Pt family member at bedside. Family member came to nurses station and this RN explained EDP will see pt soon and that pt continues to be monitored by BP cuff, pulse ox, and cardiac monitor. No further questions/concerns.

## 2015-08-31 NOTE — ED Notes (Signed)
[phone] this is wife number this is son [phone] call

## 2015-08-31 NOTE — ED Notes (Signed)
Patient ambulated to the restroom w/out trouble.

## 2015-08-31 NOTE — ED Notes (Signed)
Wife at bedside.

## 2015-10-20 ENCOUNTER — Other Ambulatory Visit: Payer: Self-pay | Admitting: Internal Medicine

## 2015-10-20 DIAGNOSIS — G8929 Other chronic pain: Secondary | ICD-10-CM

## 2015-10-20 DIAGNOSIS — M545 Low back pain, unspecified: Secondary | ICD-10-CM

## 2015-10-23 ENCOUNTER — Ambulatory Visit
Admission: RE | Admit: 2015-10-23 | Discharge: 2015-10-23 | Disposition: A | Discharge status: Home / Self Care | Payer: BC Managed Care – PPO | Source: Ambulatory Visit | Attending: Internal Medicine | Admitting: Internal Medicine

## 2015-10-23 DIAGNOSIS — G8929 Other chronic pain: Secondary | ICD-10-CM

## 2015-10-23 DIAGNOSIS — M545 Low back pain: Principal | ICD-10-CM

## 2015-10-23 MED ORDER — IOPAMIDOL (ISOVUE-M 200) INJECTION 41%
1.0000 mL | Freq: Once | INTRAMUSCULAR | Status: AC
  Administered 2015-10-23: 1 mL via EPIDURAL

## 2015-10-23 MED ORDER — METHYLPREDNISOLONE ACETATE 40 MG/ML INJ SUSP (RADIOLOG
120.0000 mg | Freq: Once | INTRAMUSCULAR | Status: AC
  Administered 2015-10-23: 120 mg via EPIDURAL

## 2015-10-23 NOTE — Discharge Instructions (Signed)
Post Procedure Spinal Discharge Instruction Sheet  1. You may resume a regular diet and any medications that you routinely take (including pain medications).  2. No driving day of procedure.  3. Light activity throughout the rest of the day.  Do not do any strenuous work, exercise, bending or lifting.  The day following the procedure, you can resume normal physical activity but you should refrain from exercising or physical therapy for at least three days thereafter.   Common Side Effects:   Headaches- take your usual medications as directed by your physician.  Increase your fluid intake.  Caffeinated beverages may be helpful.  Lie flat in bed until your headache resolves.   Restlessness or inability to sleep- you may have trouble sleeping for the next few days.  Ask your referring physician if you need any medication for sleep.   Facial flushing or redness- should subside within a few days.   Increased pain- a temporary increase in pain a day or two following your procedure is not unusual.  Take your pain medication as prescribed by your referring physician.   Leg cramps  Please contact our office at [phone] for the following symptoms:  Fever greater than 100 degrees.  Headaches unresolved with medication after 2-3 days.  Increased swelling, pain, or redness at injection site.  Thank you for visiting our office.    MAY RESUME ELIQUIS TODAY.

## 2016-01-16 ENCOUNTER — Observation Stay (HOSPITAL_COMMUNITY)
Admission: EM | Admit: 2016-01-16 | Discharge: 2016-01-16 | Disposition: A | Discharge status: Home / Self Care | Payer: Medicare Other | Attending: Cardiology | Admitting: Cardiology

## 2016-01-16 ENCOUNTER — Emergency Department (HOSPITAL_COMMUNITY): Payer: Medicare Other

## 2016-01-16 ENCOUNTER — Encounter (HOSPITAL_COMMUNITY): Payer: Self-pay | Admitting: Emergency Medicine

## 2016-01-16 DIAGNOSIS — Z7901 Long term (current) use of anticoagulants: Secondary | ICD-10-CM | POA: Insufficient documentation

## 2016-01-16 DIAGNOSIS — E785 Hyperlipidemia, unspecified: Secondary | ICD-10-CM | POA: Insufficient documentation

## 2016-01-16 DIAGNOSIS — I10 Essential (primary) hypertension: Secondary | ICD-10-CM | POA: Insufficient documentation

## 2016-01-16 DIAGNOSIS — M25512 Pain in left shoulder: Secondary | ICD-10-CM | POA: Diagnosis not present

## 2016-01-16 DIAGNOSIS — Z8249 Family history of ischemic heart disease and other diseases of the circulatory system: Secondary | ICD-10-CM | POA: Insufficient documentation

## 2016-01-16 DIAGNOSIS — M25511 Pain in right shoulder: Secondary | ICD-10-CM | POA: Diagnosis not present

## 2016-01-16 DIAGNOSIS — Z96653 Presence of artificial knee joint, bilateral: Secondary | ICD-10-CM | POA: Insufficient documentation

## 2016-01-16 DIAGNOSIS — Z7984 Long term (current) use of oral hypoglycemic drugs: Secondary | ICD-10-CM | POA: Insufficient documentation

## 2016-01-16 DIAGNOSIS — M069 Rheumatoid arthritis, unspecified: Secondary | ICD-10-CM | POA: Diagnosis not present

## 2016-01-16 DIAGNOSIS — I4891 Unspecified atrial fibrillation: Secondary | ICD-10-CM | POA: Diagnosis present

## 2016-01-16 DIAGNOSIS — K219 Gastro-esophageal reflux disease without esophagitis: Secondary | ICD-10-CM | POA: Insufficient documentation

## 2016-01-16 DIAGNOSIS — Z87891 Personal history of nicotine dependence: Secondary | ICD-10-CM | POA: Diagnosis not present

## 2016-01-16 DIAGNOSIS — E1165 Type 2 diabetes mellitus with hyperglycemia: Secondary | ICD-10-CM | POA: Insufficient documentation

## 2016-01-16 DIAGNOSIS — I480 Paroxysmal atrial fibrillation: Secondary | ICD-10-CM | POA: Diagnosis present

## 2016-01-16 HISTORY — DX: Rheumatoid arthritis, unspecified: M06.9

## 2016-01-16 LAB — CBC
HCT: 39.7 % (ref 39.0–52.0)
Hemoglobin: 13.5 g/dL (ref 13.0–17.0)
MCH: 32.5 pg (ref 26.0–34.0)
MCHC: 34 g/dL (ref 30.0–36.0)
MCV: 95.7 fL (ref 78.0–100.0)
PLATELETS: 197 10*3/uL (ref 150–400)
RBC: 4.15 MIL/uL — ABNORMAL LOW (ref 4.22–5.81)
RDW: 15.3 % (ref 11.5–15.5)
WBC: 9.1 10*3/uL (ref 4.0–10.5)

## 2016-01-16 LAB — BASIC METABOLIC PANEL
Anion gap: 12 (ref 5–15)
BUN: 8 mg/dL (ref 6–20)
CALCIUM: 9.2 mg/dL (ref 8.9–10.3)
CHLORIDE: 104 mmol/L (ref 101–111)
CO2: 18 mmol/L — ABNORMAL LOW (ref 22–32)
CREATININE: 0.79 mg/dL (ref 0.61–1.24)
Glucose, Bld: 263 mg/dL — ABNORMAL HIGH (ref 65–99)
Potassium: 3.8 mmol/L (ref 3.5–5.1)
SODIUM: 134 mmol/L — AB (ref 135–145)

## 2016-01-16 LAB — TROPONIN I
TROPONIN I: 0.08 ng/mL — AB (ref <0.03)
TROPONIN I: 0.06 ng/mL — AB (ref <0.03)

## 2016-01-16 LAB — GLUCOSE, CAPILLARY: Glucose-Capillary: 164 mg/dL — ABNORMAL HIGH (ref 65–99)

## 2016-01-16 MED ORDER — LEVOTHYROXINE SODIUM 75 MCG PO TABS: 75.0000 ug | ORAL_TABLET | Freq: Every day | ORAL | Status: DC

## 2016-01-16 MED ORDER — IRBESARTAN 150 MG PO TABS: 150.0000 mg | ORAL_TABLET | Freq: Every day | ORAL | Status: DC

## 2016-01-16 MED ORDER — ETOMIDATE 2 MG/ML IV SOLN: 0.1500 mg/kg | Freq: Once | INTRAVENOUS | Status: DC

## 2016-01-16 MED ORDER — ADULT MULTIVITAMIN W/MINERALS CH
1.0000 | ORAL_TABLET | Freq: Every day | ORAL | Status: DC
  Administered 2016-01-16: 1 via ORAL
  Filled 2016-01-16: qty 1

## 2016-01-16 MED ORDER — APIXABAN 5 MG PO TABS: 5.0000 mg | ORAL_TABLET | Freq: Two times a day (BID) | ORAL | Status: DC

## 2016-01-16 MED ORDER — DILTIAZEM HCL 25 MG/5ML IV SOLN
10.0000 mg | Freq: Once | INTRAVENOUS | Status: AC
  Administered 2016-01-16: 10 mg via INTRAVENOUS
  Filled 2016-01-16: qty 5

## 2016-01-16 MED ORDER — APIXABAN 5 MG PO TABS
5.0000 mg | ORAL_TABLET | Freq: Two times a day (BID) | ORAL | Status: DC
  Administered 2016-01-16: 5 mg via ORAL
  Filled 2016-01-16: qty 1

## 2016-01-16 MED ORDER — CELECOXIB 200 MG PO CAPS: 200.0000 mg | ORAL_CAPSULE | Freq: Every day | ORAL | Status: DC

## 2016-01-16 MED ORDER — FLECAINIDE ACETATE 50 MG PO TABS
100.0000 mg | ORAL_TABLET | Freq: Two times a day (BID) | ORAL | Status: DC
  Administered 2016-01-16: 100 mg via ORAL
  Filled 2016-01-16: qty 2

## 2016-01-16 MED ORDER — ADULT MULTIVITAMIN W/MINERALS CH: 1.0000 | ORAL_TABLET | Freq: Every day | ORAL | Status: DC

## 2016-01-16 MED ORDER — FLECAINIDE ACETATE 100 MG PO TABS: 100.0000 mg | ORAL_TABLET | Freq: Two times a day (BID) | ORAL | 1 refills | Status: DC

## 2016-01-16 MED ORDER — LEVOTHYROXINE SODIUM 75 MCG PO TABS
75.0000 ug | ORAL_TABLET | Freq: Every day | ORAL | Status: DC
  Administered 2016-01-16: 75 ug via ORAL
  Filled 2016-01-16: qty 1

## 2016-01-16 MED ORDER — ROSUVASTATIN CALCIUM 10 MG PO TABS
10.0000 mg | ORAL_TABLET | Freq: Every day | ORAL | Status: DC
  Administered 2016-01-16: 10 mg via ORAL
  Filled 2016-01-16: qty 1

## 2016-01-16 MED ORDER — PANTOPRAZOLE SODIUM 40 MG PO TBEC: 40.0000 mg | DELAYED_RELEASE_TABLET | Freq: Every day | ORAL | Status: DC

## 2016-01-16 MED ORDER — AMLODIPINE BESYLATE 5 MG PO TABS: 5.0000 mg | ORAL_TABLET | Freq: Every day | ORAL | Status: DC

## 2016-01-16 MED ORDER — IRBESARTAN 150 MG PO TABS
150.0000 mg | ORAL_TABLET | Freq: Every day | ORAL | Status: DC
  Administered 2016-01-16: 150 mg via ORAL
  Filled 2016-01-16: qty 1

## 2016-01-16 MED ORDER — METOPROLOL TARTRATE 25 MG PO TABS: 25.0000 mg | ORAL_TABLET | Freq: Two times a day (BID) | ORAL | Status: DC

## 2016-01-16 MED ORDER — METFORMIN HCL ER 500 MG PO TB24: 1000.0000 mg | ORAL_TABLET | Freq: Every day | ORAL | Status: DC

## 2016-01-16 MED ORDER — METOPROLOL TARTRATE 25 MG PO TABS: 25.0000 mg | ORAL_TABLET | Freq: Two times a day (BID) | ORAL | 2 refills | Status: DC

## 2016-01-16 MED ORDER — INSULIN ASPART 100 UNIT/ML ~~LOC~~ SOLN: 3.0000 [IU] | Freq: Three times a day (TID) | SUBCUTANEOUS | Status: DC

## 2016-01-16 MED ORDER — PANTOPRAZOLE SODIUM 40 MG PO TBEC
40.0000 mg | DELAYED_RELEASE_TABLET | Freq: Every day | ORAL | Status: DC
  Administered 2016-01-16: 40 mg via ORAL
  Filled 2016-01-16: qty 1

## 2016-01-16 MED ORDER — INSULIN ASPART 100 UNIT/ML ~~LOC~~ SOLN: 0.0000 [IU] | Freq: Three times a day (TID) | SUBCUTANEOUS | Status: DC

## 2016-01-16 MED ORDER — ROSUVASTATIN CALCIUM 10 MG PO TABS: 10.0000 mg | ORAL_TABLET | Freq: Every day | ORAL | Status: DC

## 2016-01-16 MED ORDER — AMLODIPINE BESYLATE 5 MG PO TABS
5.0000 mg | ORAL_TABLET | Freq: Every day | ORAL | Status: DC
  Administered 2016-01-16: 5 mg via ORAL
  Filled 2016-01-16: qty 1

## 2016-01-16 MED ORDER — METOPROLOL TARTRATE 25 MG PO TABS
25.0000 mg | ORAL_TABLET | Freq: Two times a day (BID) | ORAL | Status: DC
  Administered 2016-01-16: 25 mg via ORAL
  Filled 2016-01-16: qty 1

## 2016-01-16 NOTE — ED Notes (Signed)
Report called to Lb, RN at this time.  Receiving nurse denies having any further questions at this time.

## 2016-01-16 NOTE — Progress Notes (Addendum)
Pt arrived to unit, CCMD notified, VSS, pt oriented to unit.  MD Einar Gip paged and notified of patient's arrival and will see pt on floor.

## 2016-01-16 NOTE — ED Notes (Signed)
Pt states that he does not wish to be admitted to the hospital at this time. MD notified of pt's desire not to be admitted to the hospital.

## 2016-01-16 NOTE — ED Triage Notes (Signed)
Pt arrives to [facility] at this time via [facility].  Pt states that he began having bilateral shoulder pain that started at approximately 2245.  Per EMS pt received 10 mg IV diltiazem prior to arrival to the Ed. Pt has a 20G PIV in his left forearm, SL upon arrival to the Ed.   Chief Complaint  Patient presents with  . Tachycardia   Past Medical History:  Diagnosis Date  . DM (diabetes mellitus) ([place])   . GERD (gastroesophageal reflux disease)   . Hyperlipidemia   . Hypertension   . 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 Osteoarthritis   . Osteopenia   . Thyroid disease

## 2016-01-16 NOTE — ED Notes (Signed)
Patient transported to X-ray at this time via ED stretcher.  Pt in no apparent distress at this time.

## 2016-01-16 NOTE — Discharge Summary (Signed)
Please see HPI for complete details.   1. Paroxysmal A. Fib with RVR.  CHA2DS2-VASc Score is 4 with yearly risk of stroke of 4%. HAS-Bled score is 1 and estimated major bleeding in one year is 1 % 2. DM non insulin dependant, controlled with hyperglycemia. 3. Hyperlipidemia. 4. H/O Rheumatoid arthritis. 5. Bilateral shoulder pain with A. Fib with RVR, for r/o myocardial ischemia  Rec: No EKG changes even with RVR to suggest ischemia. Add Flecainide for PAF.  Minimal troponin leak probably due to A. FIb with RVR. Recheck Trop in  3 hours and if no increase, discharge home today with OP f/u.     Medication List    TAKE these medications   amLODipine 5 MG tablet Commonly known as:  NORVASC Take 5 mg by mouth daily.   celecoxib 200 MG capsule Commonly known as:  CELEBREX Take 200 mg by mouth daily after supper.   ELIQUIS 5 MG Tabs tablet Generic drug:  apixaban Take 5 mg by mouth 2 (two) times daily.   esomeprazole 40 MG capsule Commonly known as:  NEXIUM Take 40 mg by mouth daily after supper.   flecainide 100 MG tablet Commonly known as:  TAMBOCOR Take 1 tablet (100 mg total) by mouth every 12 (twelve) hours.   irbesartan 150 MG tablet Commonly known as:  AVAPRO Take 150 mg by mouth daily.   levothyroxine 75 MCG tablet Commonly known as:  SYNTHROID, LEVOTHROID Take 75 mcg by mouth daily before breakfast.   metFORMIN 500 MG 24 hr tablet Commonly known as:  GLUCOPHAGE-XR Take 1,000 mg by mouth daily after supper.   METHOTREXATE SODIUM IJ Inject into the skin every Saturday.   metoprolol tartrate 25 MG tablet Commonly known as:  LOPRESSOR Take 1 tablet (25 mg total) by mouth 2 (two) times daily.   multivitamin with minerals Tabs tablet Take 1 tablet by mouth daily.   rosuvastatin 10 MG tablet Commonly known as:  CRESTOR Take 10 mg by mouth daily.

## 2016-01-16 NOTE — ED Provider Notes (Addendum)
[facility] DEPT Provider Note   CSN: [id number] Arrival date & time: 01/16/16  0031   By signing my name below, I, Arianna Nassar, attest that this documentation has been prepared under the direction and in the presence of Waldo Damian Julio Alm, MD.  Electronically Signed: Julien Kaufman, ED Scribe. 01/16/16. 1:10 AM.   History   Chief Complaint Chief Complaint  Patient presents with  . Tachycardia   The history is provided by the patient. No language interpreter was used.   HPI Comments: [name] is a 75 y.o. male who presents to the Emergency Departmentn via EMS complaining of sudden onset, gradual worsening, moderate bilateral shoulder pain onset at 10:45 pm (~2 hours ago). Pt was dx with a-fib in May and is currently on Eliquis and metoprolol. Pt notes he ran out of his metoprolol last night and he was unable to get his medication filled today. Pt received 10 mg of diltiazem en route via EMS. Pt denies chest pain, cough, nausea, vomiting, abdominal pain.   Cardiologist: Dr. Everitt Amber Past Medical History:  Diagnosis Date  . DM (diabetes mellitus) ([place])   . GERD (gastroesophageal reflux disease)   . Hyperlipidemia   . Hypertension   . 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 Osteoarthritis   . Osteopenia   . Thyroid disease     Patient Active Problem List   Diagnosis Date Noted  . Atrial fibrillation ([place]) 01/16/2016  . History of artificial joint 12/19/2014  . H/O knee surgery 12/19/2014  . Primary osteoarthritis of both knees 10/27/2014  . Osteopenia 03/31/2014  . HEARTBURN 09/04/2009    History reviewed. No pertinent surgical history.     Home Medications    Prior to Admission medications   Medication Sig Start Date End Date Taking? Authorizing Provider  amLODipine (NORVASC) 5 MG tablet Take 5 mg by mouth daily.    Historical Provider, MD  celecoxib (CELEBREX) 200 MG capsule Take 200 mg by mouth daily after supper.    Historical Provider, MD    esomeprazole (NEXIUM) 40 MG capsule Take 40 mg by mouth daily after supper.  08/01/15   Historical Provider, MD  irbesartan (AVAPRO) 150 MG tablet Take 150 mg by mouth daily.  10/25/14   Historical Provider, MD  levothyroxine (SYNTHROID, LEVOTHROID) 75 MCG tablet Take 75 mcg by mouth daily before breakfast.    Historical Provider, MD  metFORMIN (GLUCOPHAGE-XR) 500 MG 24 hr tablet Take 1,000 mg by mouth daily after supper.  08/21/15   Historical Provider, MD  METHOTREXATE SODIUM IJ Inject into the skin every Saturday.    Historical Provider, MD  Multiple Vitamin (MULTIVITAMIN WITH MINERALS) TABS tablet Take 1 tablet by mouth daily.    Historical Provider, MD  rosuvastatin (CRESTOR) 10 MG tablet Take 10 mg by mouth daily. 08/28/15   Historical Provider, MD    Family History Family History  Problem Relation Age of Onset  . Diabetes Mellitus I Father   . Diabetes Mellitus I Mother   . CAD Brother     Social History Social History  Substance Use Topics  . Smoking status: Former Smoker    Types: Cigarettes  . Smokeless tobacco: Not on file  . Alcohol use 0.0 oz/week     Comment: "once in a while"     Allergies   Hydrocodone   Review of Systems Review of Systems  Cardiovascular: Negative for chest pain.  Gastrointestinal: Negative for abdominal pain, nausea and vomiting.  All other systems reviewed and are negative.  Physical Exam Updated Vital Signs BP 116/64 (BP Location: Right Arm)   Pulse 76   Temp 97.9 F (36.6 C) (Oral)   Resp 16   Ht 5' 2"  (1.575 m)   Wt 156 lb 6.4 oz (70.9 kg)   SpO2 100%   BMI 28.61 kg/m   Physical Exam  Constitutional: He is oriented to person, place, and time. He appears well-developed and well-nourished.  HENT:  Head: Normocephalic.  Eyes: EOM are normal.  Neck: Normal range of motion.  Cardiovascular: Normal rate, regular rhythm and normal heart sounds.   Pulmonary/Chest: Effort normal and breath sounds normal. No respiratory distress.   Abdominal: He exhibits no distension.  Musculoskeletal: Normal range of motion.  Neurological: He is alert and oriented to person, place, and time. No cranial nerve deficit.  Psychiatric: He has a normal mood and affect.  Nursing note and vitals reviewed.    ED Treatments / Results  DIAGNOSTIC STUDIES: Oxygen Saturation is 100% on RA, normal by my interpretation.  COORDINATION OF CARE:  12:57 AM Discussed treatment plan with pt at bedside and pt agreed to plan.  Labs (all labs ordered are listed, but only abnormal results are displayed) Labs Reviewed  BASIC METABOLIC PANEL - Abnormal; Notable for the following:       Result Value   Sodium 134 (*)    CO2 18 (*)    Glucose, Bld 263 (*)    All other components within normal limits  CBC - Abnormal; Notable for the following:    RBC 4.15 (*)    All other components within normal limits  TROPONIN I  TROPONIN I  TROPONIN I    EKG  EKG Interpretation  Date/Time:  Saturday January 16 2016 00:31:56 EDT Ventricular Rate:  133 PR Interval:    QRS Duration: 108 QT Interval:  306 QTC Calculation: 456 R Axis:   68 Text Interpretation:  Atrial fibrillation Minimal ST depression, inferior leads Atrial fibrillation Confirmed by Gerald Leitz ([id number]) on 01/16/2016 1:08:16 AM       Radiology Dg Chest 2 View  Result Date: 01/16/2016 CLINICAL DATA:  Atrial fibrillation. History of hypertension and diabetes. EXAM: CHEST  2 VIEW COMPARISON:  Chest radiograph August 31, 2015 in chest radiograph January 15, 2015 and March 07, 2014 FINDINGS: Strandy densities LEFT lung base. Cardiomediastinal silhouette is normal, mildly calcified aortic knob. No pleural effusion. No pneumothorax. Tiny nodular density projects in LEFT upper lobe, unchanged from 2015 most compatible granuloma. Soft tissue planes and included osseous structure nonsuspicious. IMPRESSION: LEFT lung base atelectasis. Electronically Signed   By: Elon Alas M.D.   On:  01/16/2016 01:58    Procedures Procedures (including critical care time)  Medications Ordered in ED Medications  etomidate (AMIDATE) injection 9.88 mg (9.88 mg Intravenous Not Given 01/16/16 0452)  diltiazem (CARDIZEM) injection 10 mg (10 mg Intravenous Given 01/16/16 0123)     Initial Impression / Assessment and Plan / ED Course  I have reviewed the triage vital signs and the nursing notes.  Pertinent labs & imaging results that were available during my care of the patient were reviewed by me and considered in my medical decision making (see chart for details).  Clinical Course   Patient is a pleasant 75 year old male presenting with bilateral shoulder pain and afib.  Patietn ran out of metoprolol yesterday. Patient is a Dr. Einar Gip patient.  Concerned that the shoulder pain is a ischemic equivalent will give 10 more of dilt, then start drip.  Will require admission for rate control and serial troponins.   6:47 AM Dr. Everitt Amber  requests that we do cardioversion in ED. Discussed with patient. We'll proceed.  Patietn spontaneously converted. No more shoulder pain after conversion. Admitting for serial troponins. Patient unsure of his home dose of metoprolol, will defer to inpatient team.   Final Clinical Impressions(s) / ED Diagnoses   Final diagnoses:  None    New Prescriptions Current Discharge Medication List     I personally performed the services described in this documentation, which was scribed in my presence. The recorded information has been reviewed and is accurate.      Kinsie Belford Julio Alm, MD 01/16/16 [place], MD 01/16/16 [phone]

## 2016-01-16 NOTE — H&P (Signed)
[name] is an 75 y.o. male.   Chief Complaint: Bilateral shoulder pain HPI: [name]  is a 75 y.o. male  With h/o parosyxmal A. Fib, evaluated in the emergency room on 08/31/2015 when he presented with near syncopal spell, EMS was activated and he was found to be in atrial fibrillation with rapid ventricular response which converted spontaneously to sinus rhythm.  He is fairly active with long-standing history of hypertension, well-controlled diabetes mellitus, history of rheumatoid arthritis, hyperlipidemia and family history of premature coronary artery disease. 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 Tolerating ELiquis without problems without bleeding diathesis. Presented to the ED with bilateral arm and shoulder discomfort and was again found to be in A. Fib with RVR. Converted to sinus on Dilt. Now asymptomatic. Wife at the bedside. See past history below  Past Medical History:  Diagnosis Date  . DM (diabetes mellitus) ([place])   . GERD (gastroesophageal reflux disease)   . Hyperlipidemia   . Hypertension   . 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 Osteoarthritis   . Osteopenia   . Rheumatoid arthritis ([place])   . Thyroid disease     History reviewed. No pertinent surgical history.  Family History  Problem Relation Age of Onset  . Diabetes Mellitus I Father   . Diabetes Mellitus I Mother   . CAD Brother    Social History:  reports that he has quit smoking. His smoking use included Cigarettes. He does not have any smokeless tobacco history on file. He reports that he drinks alcohol. He reports that he does not use drugs.  Allergies:  Allergies  Allergen Reactions  . Hydrocodone Nausea Only    Review of Systems - General ROS: negative for - chills, fatigue or malaise Respiratory ROS: no cough, shortness of breath, or  wheezing Cardiovascular ROS: no chest pain or dyspnea on exertion Gastrointestinal ROS: no abdominal pain, change in bowel habits, or black or bloody stools Neurological ROS: no TIA or stroke symptoms  Blood pressure 116/64, pulse 76, temperature 97.9 F (36.6 C), temperature source Oral, resp. rate 16, height 5' 2"  (1.575 m), weight 70.9 kg (156 lb 6.4 oz), SpO2 100 %. General appearance: alert, cooperative, appears stated age and no distress Eyes: negative findings: lids and lashes normal Neck: no adenopathy, no carotid bruit, no JVD, supple, symmetrical, trachea midline and thyroid not enlarged, symmetric, no tenderness/mass/nodules Neck: JVP - normal, carotids 2+= without bruits Resp: clear to auscultation bilaterally Chest wall: no tenderness Cardio: regular rate and rhythm, S1, S2 normal, no murmur, click, rub or gallop GI: soft, non-tender; bowel sounds normal; no masses,  no organomegaly Extremities: extremities normal, atraumatic, no cyanosis or edema Pulses: 2+ and symmetric Skin: Skin color, texture, turgor normal. No rashes or lesions Neurologic: Grossly normal  Results for orders placed or performed during the hospital encounter of 01/16/16 (from the past 48 hour(s))  Basic metabolic panel     Status: Abnormal   Collection Time: 01/16/16  1:01 AM  Result Value Ref Range   Sodium 134 (L) 135 - 145 mmol/L   Potassium 3.8 3.5 - 5.1 mmol/L   Chloride 104 101 - 111 mmol/L   CO2 18 (L) 22 - 32 mmol/L   Glucose, Bld 263 (H) 65 - 99 mg/dL   BUN 8 6 - 20 mg/dL   Creatinine, Ser  0.79 0.61 - 1.24 mg/dL   Calcium 9.2 8.9 - 10.3 mg/dL   GFR calc non Af Amer >60 >60 mL/min   GFR calc Af Amer >60 >60 mL/min    Comment: (NOTE) The eGFR has been calculated using the CKD EPI equation. This calculation has not been validated in all clinical situations. eGFR's persistently <60 mL/min signify possible Chronic Kidney Disease.    Anion gap 12 5 - 15  CBC     Status: Abnormal    Collection Time: 01/16/16  1:01 AM  Result Value Ref Range   WBC 9.1 4.0 - 10.5 K/uL   RBC 4.15 (L) 4.22 - 5.81 MIL/uL   Hemoglobin 13.5 13.0 - 17.0 g/dL   HCT 39.7 39.0 - 52.0 %   MCV 95.7 78.0 - 100.0 fL   MCH 32.5 26.0 - 34.0 pg   MCHC 34.0 30.0 - 36.0 g/dL   RDW 15.3 11.5 - 15.5 %   Platelets 197 150 - 400 K/uL   Dg Chest 2 View  Result Date: 01/16/2016 CLINICAL DATA:  Atrial fibrillation. History of hypertension and diabetes. EXAM: CHEST  2 VIEW COMPARISON:  Chest radiograph August 31, 2015 in chest radiograph January 15, 2015 and March 07, 2014 FINDINGS: Strandy densities LEFT lung base. Cardiomediastinal silhouette is normal, mildly calcified aortic knob. No pleural effusion. No pneumothorax. Tiny nodular density projects in LEFT upper lobe, unchanged from 2015 most compatible granuloma. Soft tissue planes and included osseous structure nonsuspicious. IMPRESSION: LEFT lung base atelectasis. Electronically Signed   By: Elon Alas M.D.   On: 01/16/2016 01:58    Labs:   Lab Results  Component Value Date   WBC 9.1 01/16/2016   HGB 13.5 01/16/2016   HCT 39.7 01/16/2016   MCV 95.7 01/16/2016   PLT 197 01/16/2016    Recent Labs Lab 01/16/16 0101  NA 134*  K 3.8  CL 104  CO2 18*  BUN 8  CREATININE 0.79  CALCIUM 9.2  GLUCOSE 263*    Recent Labs  08/31/15 1538  TSH 1.368   Cardiac Panel (last 3 results)  Recent Labs  01/16/16 0555  TROPONINI 0.08*      Medications Prior to Admission  Medication Sig Dispense Refill  . apixaban (ELIQUIS) 5 MG TABS tablet Take 5 mg by mouth 2 (two) times daily.    . metoprolol tartrate (LOPRESSOR) 25 MG tablet Take 25 mg by mouth 2 (two) times daily.    Marland Kitchen amLODipine (NORVASC) 5 MG tablet Take 5 mg by mouth daily.    . celecoxib (CELEBREX) 200 MG capsule Take 200 mg by mouth daily after supper.    . esomeprazole (NEXIUM) 40 MG capsule Take 40 mg by mouth daily after supper.   1  . irbesartan (AVAPRO) 150 MG tablet Take  150 mg by mouth daily.     Marland Kitchen levothyroxine (SYNTHROID, LEVOTHROID) 75 MCG tablet Take 75 mcg by mouth daily before breakfast.    . metFORMIN (GLUCOPHAGE-XR) 500 MG 24 hr tablet Take 1,000 mg by mouth daily after supper.   0  . METHOTREXATE SODIUM IJ Inject into the skin every Saturday.    . Multiple Vitamin (MULTIVITAMIN WITH MINERALS) TABS tablet Take 1 tablet by mouth daily.    . rosuvastatin (CRESTOR) 10 MG tablet Take 10 mg by mouth daily.  0      Current Facility-Administered Medications:  .  etomidate (AMIDATE) injection 9.88 mg, 0.15 mg/kg, Intravenous, Once, Courteney Lyn Mackuen, MD .  insulin aspart (novoLOG) injection  0-9 Units, 0-9 Units, Subcutaneous, TID WC, Adrian Prows, MD .  insulin aspart (novoLOG) injection 3 Units, 3 Units, Subcutaneous, TID WC, Adrian Prows, MD  EKG: atrial fibrillation, rate 133/min, no ST-T changes of ischemia on admission. Repeat EKG NSR without ischemia .  Echocardiogram 05/20/2015: Left ventricle cavity is normal in size. Normal global wall motion. Normal diastolic filling pattern. Calculated EF 63%. Trace mitral regurgitation. Trace tricuspid regurgitation. Trace pulmonic regurgitation. Essentially normal echocardiogram.  Lexiscan myoview stress test 05/08/2015: 1. The resting electrocardiogram demonstrated normal sinus rhythm, normal resting conduction and no resting arrhythmias.  Stress EKG is non-diagnostic for ischemia as it a pharmacologic stress using Lexiscan. Stress symptoms included dyspnea. 2. The perfusion imaging study demonstrates mild gut uptake artifact in the inferior wall but no evidence of ischemia or scar.  Left ventricle is normal in size in both rest and stress images.  Left ventricular systolic function calculated by QGS was 61%.  This is a low risk study.  Assessment/Plan 1. Paroxysmal A. Fib with RVR.  CHA2DS2-VASc Score is 4 with yearly risk of stroke of 4%. HAS-Bled score is 1 and estimated major bleeding in one year is 1  % 2. DM non insulin dependant, controlled with hyperglycemia. 3. Hyperlipidemia. 4. H/O Rheumatoid arthritis. 5. Bilateral shoulder pain with A. Fib with RVR, for r/o myocardial ischemia  Rec: No EKG changes even with RVR to suggest ischemia. Add Flecainide for PAF.  Minimal troponin lead probably due to A. FIb with RVR. Recheck Trop in  3 hours and if no increase, discharge home today with OP f/u.   Adrian Prows, MD 01/16/2016, 7:19 AM [facility]. [place] Pager: [phone] Office: [phone] If no answer: Cell:  [phone]

## 2016-01-16 NOTE — ED Notes (Signed)
Pt returned from X-ray at this time via ED stretcher. Pt in no apparent distress at this time. Pt's heart rate noted to be 118 bpm.

## 2016-01-20 LAB — HEMOGLOBIN A1C

## 2016-02-11 ENCOUNTER — Other Ambulatory Visit: Payer: Self-pay | Admitting: Internal Medicine

## 2016-02-11 DIAGNOSIS — R945 Abnormal results of liver function studies: Secondary | ICD-10-CM

## 2016-02-11 DIAGNOSIS — R7989 Other specified abnormal findings of blood chemistry: Secondary | ICD-10-CM

## 2016-02-23 ENCOUNTER — Other Ambulatory Visit: Payer: Self-pay | Admitting: Rheumatology

## 2016-02-23 DIAGNOSIS — G8929 Other chronic pain: Secondary | ICD-10-CM

## 2016-02-23 DIAGNOSIS — M545 Low back pain: Principal | ICD-10-CM

## 2016-03-08 ENCOUNTER — Ambulatory Visit
Admission: RE | Admit: 2016-03-08 | Discharge: 2016-03-08 | Disposition: A | Discharge status: Home / Self Care | Payer: 59 | Source: Ambulatory Visit | Attending: Internal Medicine | Admitting: Internal Medicine

## 2016-03-08 ENCOUNTER — Ambulatory Visit
Admission: RE | Admit: 2016-03-08 | Discharge: 2016-03-08 | Disposition: A | Discharge status: Home / Self Care | Payer: BC Managed Care – PPO | Source: Ambulatory Visit | Attending: Rheumatology | Admitting: Rheumatology

## 2016-03-08 DIAGNOSIS — M545 Low back pain: Principal | ICD-10-CM

## 2016-03-08 DIAGNOSIS — R7989 Other specified abnormal findings of blood chemistry: Secondary | ICD-10-CM

## 2016-03-08 DIAGNOSIS — R945 Abnormal results of liver function studies: Secondary | ICD-10-CM

## 2016-03-08 DIAGNOSIS — G8929 Other chronic pain: Secondary | ICD-10-CM

## 2016-03-08 MED ORDER — IOPAMIDOL (ISOVUE-M 200) INJECTION 41%
1.0000 mL | Freq: Once | INTRAMUSCULAR | Status: AC
  Administered 2016-03-08: 1 mL via EPIDURAL

## 2016-03-08 MED ORDER — METHYLPREDNISOLONE ACETATE 40 MG/ML INJ SUSP (RADIOLOG
120.0000 mg | Freq: Once | INTRAMUSCULAR | Status: AC
  Administered 2016-03-08: 120 mg via EPIDURAL

## 2016-03-08 NOTE — Discharge Instructions (Signed)
Post Procedure Spinal Discharge Instruction Sheet  1. You may resume a regular diet and any medications that you routinely take (including pain medications).  2. No driving day of procedure.  3. Light activity throughout the rest of the day.  Do not do any strenuous work, exercise, bending or lifting.  The day following the procedure, you can resume normal physical activity but you should refrain from exercising or physical therapy for at least three days thereafter.   Common Side Effects:   Headaches- take your usual medications as directed by your physician.  Increase your fluid intake.  Caffeinated beverages may be helpful.  Lie flat in bed until your headache resolves.   Restlessness or inability to sleep- you may have trouble sleeping for the next few days.  Ask your referring physician if you need any medication for sleep.   Facial flushing or redness- should subside within a few days.   Increased pain- a temporary increase in pain a day or two following your procedure is not unusual.  Take your pain medication as prescribed by your referring physician.   Leg cramps  Please contact our office at [phone] for the following symptoms:  Fever greater than 100 degrees.  Headaches unresolved with medication after 2-3 days.  Increased swelling, pain, or redness at injection site.  Thank you for visiting our office.   You may resume Eliquis today.

## 2016-07-19 ENCOUNTER — Other Ambulatory Visit: Payer: Self-pay | Admitting: Rheumatology

## 2016-07-19 DIAGNOSIS — G8929 Other chronic pain: Secondary | ICD-10-CM

## 2016-07-19 DIAGNOSIS — M545 Low back pain: Principal | ICD-10-CM

## 2016-08-01 ENCOUNTER — Ambulatory Visit
Admission: RE | Admit: 2016-08-01 | Discharge: 2016-08-01 | Disposition: A | Discharge status: Home / Self Care | Payer: Medicare Other | Source: Ambulatory Visit | Attending: Rheumatology | Admitting: Rheumatology

## 2016-08-01 DIAGNOSIS — M545 Low back pain: Principal | ICD-10-CM

## 2016-08-01 DIAGNOSIS — G8929 Other chronic pain: Secondary | ICD-10-CM

## 2016-08-01 MED ORDER — IOPAMIDOL (ISOVUE-M 200) INJECTION 41%
1.0000 mL | Freq: Once | INTRAMUSCULAR | Status: AC
  Administered 2016-08-01: 1 mL via EPIDURAL

## 2016-08-01 MED ORDER — METHYLPREDNISOLONE ACETATE 40 MG/ML INJ SUSP (RADIOLOG
120.0000 mg | Freq: Once | INTRAMUSCULAR | Status: AC
  Administered 2016-08-01: 120 mg via EPIDURAL

## 2016-08-01 NOTE — Discharge Instructions (Signed)
Post Procedure Spinal Discharge Instruction Sheet  1. You may resume a regular diet and any medications that you routinely take (including pain medications).  2. No driving day of procedure.  3. Light activity throughout the rest of the day.  Do not do any strenuous work, exercise, bending or lifting.  The day following the procedure, you can resume normal physical activity but you should refrain from exercising or physical therapy for at least three days thereafter.   Common Side Effects:   Headaches- take your usual medications as directed by your physician.  Increase your fluid intake.  Caffeinated beverages may be helpful.  Lie flat in bed until your headache resolves.   Restlessness or inability to sleep- you may have trouble sleeping for the next few days.  Ask your referring physician if you need any medication for sleep.   Facial flushing or redness- should subside within a few days.   Increased pain- a temporary increase in pain a day or two following your procedure is not unusual.  Take your pain medication as prescribed by your referring physician.   Leg cramps  Please contact our office at [phone] for the following symptoms:  Fever greater than 100 degrees.  Headaches unresolved with medication after 2-3 days.  Increased swelling, pain, or redness at injection site.  Thank you for visiting our office.   MAY RESUME ELIQUIS TODAY.

## 2016-08-17 ENCOUNTER — Other Ambulatory Visit: Payer: Self-pay | Admitting: Rheumatology

## 2016-08-17 DIAGNOSIS — G8929 Other chronic pain: Secondary | ICD-10-CM

## 2016-08-17 DIAGNOSIS — M545 Low back pain: Principal | ICD-10-CM

## 2016-08-22 ENCOUNTER — Ambulatory Visit
Admission: RE | Admit: 2016-08-22 | Discharge: 2016-08-22 | Disposition: A | Discharge status: Home / Self Care | Payer: Medicare Other | Source: Ambulatory Visit | Attending: Rheumatology | Admitting: Rheumatology

## 2016-08-22 DIAGNOSIS — G8929 Other chronic pain: Secondary | ICD-10-CM

## 2016-08-22 DIAGNOSIS — M545 Low back pain: Principal | ICD-10-CM

## 2016-08-22 MED ORDER — IOPAMIDOL (ISOVUE-M 200) INJECTION 41%
1.0000 mL | Freq: Once | INTRAMUSCULAR | Status: AC
  Administered 2016-08-22: 1 mL via EPIDURAL

## 2016-08-22 MED ORDER — METHYLPREDNISOLONE ACETATE 40 MG/ML INJ SUSP (RADIOLOG
120.0000 mg | Freq: Once | INTRAMUSCULAR | Status: AC
  Administered 2016-08-22: 120 mg via EPIDURAL

## 2016-08-22 NOTE — Discharge Instructions (Signed)
Post Procedure Spinal Discharge Instruction Sheet  1. You may resume a regular diet and any medications that you routinely take (including pain medications).  2. No driving day of procedure.  3. Light activity throughout the rest of the day.  Do not do any strenuous work, exercise, bending or lifting.  The day following the procedure, you can resume normal physical activity but you should refrain from exercising or physical therapy for at least three days thereafter.   Common Side Effects:   Headaches- take your usual medications as directed by your physician.  Increase your fluid intake.  Caffeinated beverages may be helpful.  Lie flat in bed until your headache resolves.   Restlessness or inability to sleep- you may have trouble sleeping for the next few days.  Ask your referring physician if you need any medication for sleep.   Facial flushing or redness- should subside within a few days.   Increased pain- a temporary increase in pain a day or two following your procedure is not unusual.  Take your pain medication as prescribed by your referring physician.   Leg cramps  Please contact our office at [phone] for the following symptoms:  Fever greater than 100 degrees.  Headaches unresolved with medication after 2-3 days.  Increased swelling, pain, or redness at injection site.  Thank you for visiting our office.   MAY RESUME ELIQUIS TODAY.

## 2016-09-29 ENCOUNTER — Other Ambulatory Visit: Payer: Self-pay | Admitting: Rheumatology

## 2016-09-29 DIAGNOSIS — M545 Low back pain, unspecified: Secondary | ICD-10-CM

## 2016-09-29 DIAGNOSIS — M4800 Spinal stenosis, site unspecified: Secondary | ICD-10-CM

## 2016-10-06 ENCOUNTER — Other Ambulatory Visit: Payer: Medicare Other

## 2017-02-08 ENCOUNTER — Other Ambulatory Visit: Payer: Self-pay | Admitting: Rheumatology

## 2017-02-08 DIAGNOSIS — G8929 Other chronic pain: Secondary | ICD-10-CM

## 2017-02-08 DIAGNOSIS — M545 Low back pain, unspecified: Secondary | ICD-10-CM

## 2017-02-21 ENCOUNTER — Ambulatory Visit
Admission: RE | Admit: 2017-02-21 | Discharge: 2017-02-21 | Disposition: A | Discharge status: Home / Self Care | Payer: Medicare Other | Source: Ambulatory Visit | Attending: Rheumatology | Admitting: Rheumatology

## 2017-02-21 DIAGNOSIS — M545 Low back pain, unspecified: Secondary | ICD-10-CM

## 2017-02-21 DIAGNOSIS — G8929 Other chronic pain: Secondary | ICD-10-CM

## 2017-02-21 MED ORDER — IOPAMIDOL (ISOVUE-M 200) INJECTION 41%
1.0000 mL | Freq: Once | INTRAMUSCULAR | Status: AC
  Administered 2017-02-21: 1 mL via EPIDURAL

## 2017-02-21 MED ORDER — METHYLPREDNISOLONE ACETATE 40 MG/ML INJ SUSP (RADIOLOG
120.0000 mg | Freq: Once | INTRAMUSCULAR | Status: AC
  Administered 2017-02-21: 120 mg via EPIDURAL

## 2017-02-21 NOTE — Discharge Instructions (Signed)
Spinal Injection Discharge Instruction Sheet  1. You may resume a regular diet and any medications that you routinely take, including pain medications.  2. No driving the rest of the day of the procedure.  3. Light activity throughout the rest of the day.  Do not do any strenuous work, exercise, bending or lifting.  The day following the procedure, you may resume normal physical activity but you should refrain from exercising or physical therapy for at least three days.   Common Side Effects:   Headaches- take your usual medications as directed by your physician.     Restlessness or inability to sleep- you may have trouble sleeping for the next few days.  Ask your referring physician if you need any medication for sleep if over the counter sleep medications do not help.   Facial flushing or redness- this should subside within a few days.   Increased pain- a temporary increase in pain a day or two following your procedure is not unusual.  Take your pain medication as prescribed by your referring physician.  You may use ice to the injection site as needed.  Please do not use heat for 24 hours.   Leg cramps  Please contact our office at [phone] for the following symptoms:  Fever greater than 100 degrees.  Headaches unresolved with medication after 2-3 days.  Increased swelling, pain, or redness at injection site.  Thank you for visiting our office.   You may resume Eliquis today.

## 2017-03-24 ENCOUNTER — Other Ambulatory Visit: Payer: Self-pay | Admitting: Internal Medicine

## 2017-03-24 DIAGNOSIS — K560 Paralytic ileus: Secondary | ICD-10-CM

## 2017-03-30 ENCOUNTER — Ambulatory Visit
Admission: RE | Admit: 2017-03-30 | Discharge: 2017-03-30 | Disposition: A | Discharge status: Home / Self Care | Payer: Medicare Other | Source: Ambulatory Visit | Attending: Internal Medicine | Admitting: Internal Medicine

## 2017-03-30 DIAGNOSIS — K560 Paralytic ileus: Secondary | ICD-10-CM

## 2017-07-14 ENCOUNTER — Other Ambulatory Visit: Payer: Self-pay | Admitting: Rheumatology

## 2017-07-14 DIAGNOSIS — G8929 Other chronic pain: Secondary | ICD-10-CM

## 2017-07-14 DIAGNOSIS — M545 Low back pain: Principal | ICD-10-CM

## 2017-07-24 ENCOUNTER — Ambulatory Visit
Admission: RE | Admit: 2017-07-24 | Discharge: 2017-07-24 | Disposition: A | Discharge status: Home / Self Care | Payer: Medicare Other | Source: Ambulatory Visit | Attending: Rheumatology | Admitting: Rheumatology

## 2017-07-24 DIAGNOSIS — G8929 Other chronic pain: Secondary | ICD-10-CM

## 2017-07-24 DIAGNOSIS — M545 Low back pain: Principal | ICD-10-CM

## 2017-07-24 MED ORDER — IOPAMIDOL (ISOVUE-M 200) INJECTION 41%
1.0000 mL | Freq: Once | INTRAMUSCULAR | Status: AC
  Administered 2017-07-24: 1 mL via EPIDURAL

## 2017-07-24 MED ORDER — METHYLPREDNISOLONE ACETATE 40 MG/ML INJ SUSP (RADIOLOG
120.0000 mg | Freq: Once | INTRAMUSCULAR | Status: AC
  Administered 2017-07-24: 120 mg via EPIDURAL

## 2017-07-24 NOTE — Discharge Instructions (Signed)

## 2017-08-05 ENCOUNTER — Emergency Department (HOSPITAL_COMMUNITY)
Admission: EM | Admit: 2017-08-05 | Discharge: 2017-08-06 | Disposition: A | Discharge status: Home / Self Care | Payer: Medicare Other | Attending: Emergency Medicine | Admitting: Emergency Medicine

## 2017-08-05 ENCOUNTER — Encounter (HOSPITAL_COMMUNITY): Payer: Self-pay

## 2017-08-05 ENCOUNTER — Emergency Department (HOSPITAL_COMMUNITY): Payer: Medicare Other

## 2017-08-05 DIAGNOSIS — R200 Anesthesia of skin: Secondary | ICD-10-CM | POA: Insufficient documentation

## 2017-08-05 DIAGNOSIS — M6281 Muscle weakness (generalized): Secondary | ICD-10-CM | POA: Diagnosis not present

## 2017-08-05 DIAGNOSIS — S80211A Abrasion, right knee, initial encounter: Secondary | ICD-10-CM | POA: Insufficient documentation

## 2017-08-05 DIAGNOSIS — Y9389 Activity, other specified: Secondary | ICD-10-CM | POA: Diagnosis not present

## 2017-08-05 DIAGNOSIS — M545 Low back pain: Secondary | ICD-10-CM | POA: Diagnosis not present

## 2017-08-05 DIAGNOSIS — W010XXA Fall on same level from slipping, tripping and stumbling without subsequent striking against object, initial encounter: Secondary | ICD-10-CM | POA: Diagnosis not present

## 2017-08-05 DIAGNOSIS — Y92002 Bathroom of unspecified non-institutional (private) residence single-family (private) house as the place of occurrence of the external cause: Secondary | ICD-10-CM | POA: Insufficient documentation

## 2017-08-05 DIAGNOSIS — Y999 Unspecified external cause status: Secondary | ICD-10-CM | POA: Diagnosis not present

## 2017-08-05 DIAGNOSIS — Z7901 Long term (current) use of anticoagulants: Secondary | ICD-10-CM | POA: Insufficient documentation

## 2017-08-05 DIAGNOSIS — E119 Type 2 diabetes mellitus without complications: Secondary | ICD-10-CM | POA: Insufficient documentation

## 2017-08-05 DIAGNOSIS — I10 Essential (primary) hypertension: Secondary | ICD-10-CM | POA: Insufficient documentation

## 2017-08-05 DIAGNOSIS — Z79899 Other long term (current) drug therapy: Secondary | ICD-10-CM | POA: Diagnosis not present

## 2017-08-05 DIAGNOSIS — S80212A Abrasion, left knee, initial encounter: Secondary | ICD-10-CM | POA: Insufficient documentation

## 2017-08-05 DIAGNOSIS — R2681 Unsteadiness on feet: Secondary | ICD-10-CM | POA: Diagnosis not present

## 2017-08-05 DIAGNOSIS — W19XXXA Unspecified fall, initial encounter: Secondary | ICD-10-CM

## 2017-08-05 DIAGNOSIS — R630 Anorexia: Secondary | ICD-10-CM | POA: Diagnosis not present

## 2017-08-05 DIAGNOSIS — S8992XA Unspecified injury of left lower leg, initial encounter: Secondary | ICD-10-CM | POA: Diagnosis present

## 2017-08-05 DIAGNOSIS — Z7984 Long term (current) use of oral hypoglycemic drugs: Secondary | ICD-10-CM | POA: Diagnosis not present

## 2017-08-05 DIAGNOSIS — R410 Disorientation, unspecified: Secondary | ICD-10-CM | POA: Insufficient documentation

## 2017-08-05 DIAGNOSIS — Z87891 Personal history of nicotine dependence: Secondary | ICD-10-CM | POA: Diagnosis not present

## 2017-08-05 DIAGNOSIS — R531 Weakness: Secondary | ICD-10-CM

## 2017-08-05 DIAGNOSIS — Y92009 Unspecified place in unspecified non-institutional (private) residence as the place of occurrence of the external cause: Secondary | ICD-10-CM

## 2017-08-05 DIAGNOSIS — S80219A Abrasion, unspecified knee, initial encounter: Secondary | ICD-10-CM

## 2017-08-05 LAB — DIFFERENTIAL
Basophils Absolute: 0 10*3/uL (ref 0.0–0.1)
Basophils Relative: 0 %
EOS PCT: 0 %
Eosinophils Absolute: 0 10*3/uL (ref 0.0–0.7)
LYMPHS ABS: 2 10*3/uL (ref 0.7–4.0)
LYMPHS PCT: 21 %
MONO ABS: 0.7 10*3/uL (ref 0.1–1.0)
MONOS PCT: 7 %
NEUTROS ABS: 6.5 10*3/uL (ref 1.7–7.7)
Neutrophils Relative %: 72 %

## 2017-08-05 LAB — URINALYSIS, ROUTINE W REFLEX MICROSCOPIC
Bilirubin Urine: NEGATIVE
GLUCOSE, UA: NEGATIVE mg/dL
Hgb urine dipstick: NEGATIVE
KETONES UR: NEGATIVE mg/dL
LEUKOCYTES UA: NEGATIVE
NITRITE: NEGATIVE
Protein, ur: NEGATIVE mg/dL
SPECIFIC GRAVITY, URINE: 1.011 (ref 1.005–1.030)
pH: 7 (ref 5.0–8.0)

## 2017-08-05 LAB — I-STAT TROPONIN, ED: TROPONIN I, POC: 0.03 ng/mL (ref 0.00–0.08)

## 2017-08-05 LAB — I-STAT CHEM 8, ED
BUN: 9 mg/dL (ref 6–20)
CALCIUM ION: 1.27 mmol/L (ref 1.15–1.40)
Chloride: 100 mmol/L — ABNORMAL LOW (ref 101–111)
Creatinine, Ser: 0.9 mg/dL (ref 0.61–1.24)
GLUCOSE: 169 mg/dL — AB (ref 65–99)
HCT: 41 % (ref 39.0–52.0)
HEMOGLOBIN: 13.9 g/dL (ref 13.0–17.0)
POTASSIUM: 4.7 mmol/L (ref 3.5–5.1)
Sodium: 134 mmol/L — ABNORMAL LOW (ref 135–145)
TCO2: 22 mmol/L (ref 22–32)

## 2017-08-05 LAB — APTT: APTT: 31 s (ref 24–36)

## 2017-08-05 LAB — PROTIME-INR
INR: 1.64
Prothrombin Time: 19.3 seconds — ABNORMAL HIGH (ref 11.4–15.2)

## 2017-08-05 NOTE — ED Triage Notes (Signed)
To room via Ems.  Information per son, Onset yesterday pt went to grocery store and walked around store without difficulty.  Today pt was having some difficulties walking around home, having to hold onto walls.  Onset tonight 9:30pm pt was walking back from bathroom, fell and was unable to get back up on feet.  Pt was on knees leaning on table when son had to call for EMS to assist.   Pt had steroid shot in right knee 2 days ago.  Pt able to hold each leg up for 5 seconds with no drift.
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
## 2017-08-06 ENCOUNTER — Emergency Department (HOSPITAL_COMMUNITY): Payer: Medicare Other

## 2017-08-06 DIAGNOSIS — R531 Weakness: Secondary | ICD-10-CM

## 2017-08-06 LAB — COMPREHENSIVE METABOLIC PANEL
ALBUMIN: 3.2 g/dL — AB (ref 3.5–5.0)
ALT: 30 U/L (ref 17–63)
ANION GAP: 13 (ref 5–15)
AST: 56 U/L — ABNORMAL HIGH (ref 15–41)
Alkaline Phosphatase: 61 U/L (ref 38–126)
BILIRUBIN TOTAL: 0.6 mg/dL (ref 0.3–1.2)
BUN: 9 mg/dL (ref 6–20)
CO2: 18 mmol/L — ABNORMAL LOW (ref 22–32)
Calcium: 9.6 mg/dL (ref 8.9–10.3)
Chloride: 100 mmol/L — ABNORMAL LOW (ref 101–111)
Creatinine, Ser: 1.02 mg/dL (ref 0.61–1.24)
Glucose, Bld: 172 mg/dL — ABNORMAL HIGH (ref 65–99)
Potassium: 4.7 mmol/L (ref 3.5–5.1)
Sodium: 131 mmol/L — ABNORMAL LOW (ref 135–145)
TOTAL PROTEIN: 6 g/dL — AB (ref 6.5–8.1)

## 2017-08-06 LAB — CBC
HEMATOCRIT: 40.2 % (ref 39.0–52.0)
HEMOGLOBIN: 13.6 g/dL (ref 13.0–17.0)
MCH: 33.7 pg (ref 26.0–34.0)
MCHC: 33.8 g/dL (ref 30.0–36.0)
MCV: 99.5 fL (ref 78.0–100.0)
Platelets: 98 10*3/uL — ABNORMAL LOW (ref 150–400)
RBC: 4.04 MIL/uL — ABNORMAL LOW (ref 4.22–5.81)
RDW: 15 % (ref 11.5–15.5)
WBC: 9.2 10*3/uL (ref 4.0–10.5)

## 2017-08-06 LAB — ETHANOL

## 2017-08-06 LAB — RAPID URINE DRUG SCREEN, HOSP PERFORMED
Amphetamines: NOT DETECTED
BENZODIAZEPINES: NOT DETECTED
Barbiturates: NOT DETECTED
COCAINE: NOT DETECTED
OPIATES: NOT DETECTED
Tetrahydrocannabinol: NOT DETECTED

## 2017-08-06 MED ORDER — GADOBENATE DIMEGLUMINE 529 MG/ML IV SOLN
14.0000 mL | Freq: Once | INTRAVENOUS | Status: AC | PRN
  Administered 2017-08-06: 14 mL via INTRAVENOUS

## 2017-08-06 NOTE — Discharge Instructions (Addendum)
Let Dr Mathis Fare office know about your ED visit tomorrow, Monday. I have placed an order to have a physical therapist evaluate you at home to help with strengthening your legs and to help you ambulate better. Keep the abrasions on your knees clean and dry. You can put antibiotic ointment on them. Recheck if you get worse again.

## 2017-08-06 NOTE — ED Notes (Signed)
Patient is alert and orientedx4.  Patient was explained discharge instructions and they understood them with no questions.   

## 2017-08-06 NOTE — ED Notes (Signed)
Patient transported to MRI 

## 2017-08-06 NOTE — ED Notes (Signed)
Patient able to ambulate without assistance.  Patient did hold on to rail while walking.

## 2017-08-06 NOTE — Consult Note (Addendum)
Neurology Consultation Reason for Consult: Bilateral leg weakness Referring Physician: Tomi Bamberger  History is obtained from: Patient   HPI: [name] is a 77 y.o. male with HTN, HLD, DM, Rheumatoid arthritis, Afib on Eliquis who presents to [facility] ER after having a fall around 9.30 pm when going to bathroom. Patient states for last 2 days he has been having difficulty getting up from the bed/chair. He also complains of numbness in both feet which has been going on for long time, intermittently. He is also complaining of pain in both knees and both ankles.He had a Depo-Medrol injection on the right L3/4 on March 4 and he had both of his knees injected a few days ago. Patient denies slurred speech, loss of vision, aphasia, weakness in arm/leg.    Neurology was consulted as EDP was concerned patient may have had a stroke vs epidural hematoma given patient is on Eliquis and having difficulty with walking.  It appears after MRI brain patient felt better and has been ambulating without support in hallways.   ROS: A 14 point ROS was performed and is negative except as noted in the HPI.     Past Medical History:  Diagnosis Date  . DM (diabetes mellitus) ([place])   . GERD (gastroesophageal reflux disease)   . Hyperlipidemia   . Hypertension   . Legionella pneumonia ([place])   . Osteoarthritis   . Osteopenia   . Rheumatoid arthritis ([place])   . Thyroid disease      Family History  Problem Relation Age of Onset  . Diabetes Mellitus I Father   . Diabetes Mellitus I Mother   . CAD Brother      Social History:  reports that he has quit smoking. His smoking use included cigarettes. he has never used smokeless tobacco. He reports that he drinks alcohol. He reports that he does not use drugs.   Exam: Current vital signs: BP 121/79   Pulse 77   Temp 99.6 F (37.6 C) (Oral)   Resp 17   SpO2 99%  Vital signs in last 24 hours: Temp:  [99.4 F (37.4 C)-99.6 F (37.6 C)] 99.6 F (37.6 C)  (03/16 2235) Pulse Rate:  [71-99] 77 (03/17 0530) Resp:  [17-22] 17 (03/16 2315) BP: (102-124)/(65-85) 121/79 (03/17 0530) SpO2:  [95 %-100 %] 99 % (03/17 0530)   Physical Exam  Constitutional: Appears well-developed and well-nourished.  Psych: Affect appropriate to situation Eyes: No scleral injection HENT: No OP obstrucion Head: Normocephalic.  Cardiovascular: Normal rate and regular rhythm.  Respiratory: Effort normal, non-labored breathing GI: Soft.  No distension. There is no tenderness.  Skin: WDI  Neuro: Mental Status: Patient is awake, alert, oriented to person, place, month, year, and situation Patient is able to give a clear and coherent history. No signs of aphasia or neglect Cranial Nerves: II: Visual Fields are full. Pupils are equal, round, and reactive to light.   III,IV, VI: EOMI without ptosis or diploplia.  V: Facial sensation is symmetric to temperature VII: Facial movement is symmetric.  VIII: hearing is intact to voice X: Uvula elevates symmetrically XI: Shoulder shrug is symmetric. XII: tongue is midline without atrophy or fasciculations.  Motor: Tone is normal. Bulk is normal. 5/5 strength was present in all four extremities.  Sensory: Sensation is symmetric to light touch and temperature in the arms and legs. Deep Tendon Reflexes: 1+ and symmetric in the biceps and patellae.  Plantars: Toes are downgoing bilaterally.  Cerebellar: FNF and HKS are intact bilaterally  I have reviewed labs in epic and the results pertinent to this consultation are:Na 131   I have reviewed the images obtained:CT head. MRI brain and MRI L spine    ASSESSMENT AND PLAN 77 y.o. male with HTN, HLD, DM, Rheumatoid arthritis, Afib on Eliquis who presents to [facility] ER presents with 2 days of weakness ibn both legs making it difficulty for him to get up from a chair/bed as well as having a fall. He also complains of numbness intermittently in both legs. History not  consistent with TIA. MR brain negative for stroke/ MR L spine appears stable. Patient denies radicular pain from back to his legs, however complains of knee pain and ankle pain. Also has mild bilateral hip tenderness. Able to ambulate without difficulty.   Impression:  Neurological exam :no muscle weakness in both lower extremities.  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

## 2017-08-06 NOTE — ED Notes (Signed)
Patient ambulated with no problems

## 2017-08-12 ENCOUNTER — Emergency Department (HOSPITAL_COMMUNITY): Payer: Medicare Other

## 2017-08-12 ENCOUNTER — Other Ambulatory Visit: Payer: Self-pay

## 2017-08-12 ENCOUNTER — Inpatient Hospital Stay (HOSPITAL_COMMUNITY)
Admission: EM | Admit: 2017-08-12 | Discharge: 2017-08-18 | DRG: 193 | Disposition: A | Discharge status: Rehabilitation Facility | Payer: Medicare Other | Attending: Internal Medicine | Admitting: Internal Medicine

## 2017-08-12 DIAGNOSIS — Z6825 Body mass index (BMI) 25.0-25.9, adult: Secondary | ICD-10-CM

## 2017-08-12 DIAGNOSIS — Z79899 Other long term (current) drug therapy: Secondary | ICD-10-CM

## 2017-08-12 DIAGNOSIS — R2689 Other abnormalities of gait and mobility: Secondary | ICD-10-CM

## 2017-08-12 DIAGNOSIS — J690 Pneumonitis due to inhalation of food and vomit: Secondary | ICD-10-CM | POA: Diagnosis not present

## 2017-08-12 DIAGNOSIS — Z7901 Long term (current) use of anticoagulants: Secondary | ICD-10-CM | POA: Diagnosis not present

## 2017-08-12 DIAGNOSIS — G9341 Metabolic encephalopathy: Secondary | ICD-10-CM | POA: Diagnosis present

## 2017-08-12 DIAGNOSIS — E1142 Type 2 diabetes mellitus with diabetic polyneuropathy: Secondary | ICD-10-CM

## 2017-08-12 DIAGNOSIS — E1165 Type 2 diabetes mellitus with hyperglycemia: Secondary | ICD-10-CM | POA: Diagnosis present

## 2017-08-12 DIAGNOSIS — M48062 Spinal stenosis, lumbar region with neurogenic claudication: Secondary | ICD-10-CM | POA: Diagnosis not present

## 2017-08-12 DIAGNOSIS — E876 Hypokalemia: Secondary | ICD-10-CM | POA: Diagnosis not present

## 2017-08-12 DIAGNOSIS — Z833 Family history of diabetes mellitus: Secondary | ICD-10-CM | POA: Diagnosis not present

## 2017-08-12 DIAGNOSIS — E109 Type 1 diabetes mellitus without complications: Secondary | ICD-10-CM | POA: Diagnosis present

## 2017-08-12 DIAGNOSIS — M47817 Spondylosis without myelopathy or radiculopathy, lumbosacral region: Secondary | ICD-10-CM | POA: Diagnosis present

## 2017-08-12 DIAGNOSIS — M170 Bilateral primary osteoarthritis of knee: Secondary | ICD-10-CM | POA: Diagnosis present

## 2017-08-12 DIAGNOSIS — Z8249 Family history of ischemic heart disease and other diseases of the circulatory system: Secondary | ICD-10-CM | POA: Diagnosis not present

## 2017-08-12 DIAGNOSIS — R338 Other retention of urine: Secondary | ICD-10-CM | POA: Diagnosis not present

## 2017-08-12 DIAGNOSIS — I480 Paroxysmal atrial fibrillation: Secondary | ICD-10-CM | POA: Diagnosis present

## 2017-08-12 DIAGNOSIS — E222 Syndrome of inappropriate secretion of antidiuretic hormone: Secondary | ICD-10-CM | POA: Diagnosis present

## 2017-08-12 DIAGNOSIS — R509 Fever, unspecified: Secondary | ICD-10-CM

## 2017-08-12 DIAGNOSIS — R066 Hiccough: Secondary | ICD-10-CM | POA: Diagnosis not present

## 2017-08-12 DIAGNOSIS — M48061 Spinal stenosis, lumbar region without neurogenic claudication: Secondary | ICD-10-CM | POA: Diagnosis present

## 2017-08-12 DIAGNOSIS — N401 Enlarged prostate with lower urinary tract symptoms: Secondary | ICD-10-CM | POA: Diagnosis present

## 2017-08-12 DIAGNOSIS — R296 Repeated falls: Secondary | ICD-10-CM | POA: Diagnosis present

## 2017-08-12 DIAGNOSIS — J189 Pneumonia, unspecified organism: Secondary | ICD-10-CM | POA: Diagnosis present

## 2017-08-12 DIAGNOSIS — F05 Delirium due to known physiological condition: Secondary | ICD-10-CM | POA: Diagnosis not present

## 2017-08-12 DIAGNOSIS — E785 Hyperlipidemia, unspecified: Secondary | ICD-10-CM | POA: Diagnosis present

## 2017-08-12 DIAGNOSIS — G9340 Encephalopathy, unspecified: Secondary | ICD-10-CM | POA: Diagnosis not present

## 2017-08-12 DIAGNOSIS — I10 Essential (primary) hypertension: Secondary | ICD-10-CM | POA: Diagnosis present

## 2017-08-12 DIAGNOSIS — K219 Gastro-esophageal reflux disease without esophagitis: Secondary | ICD-10-CM | POA: Diagnosis present

## 2017-08-12 DIAGNOSIS — R339 Retention of urine, unspecified: Secondary | ICD-10-CM

## 2017-08-12 DIAGNOSIS — M069 Rheumatoid arthritis, unspecified: Secondary | ICD-10-CM | POA: Diagnosis present

## 2017-08-12 DIAGNOSIS — R260 Ataxic gait: Secondary | ICD-10-CM | POA: Diagnosis present

## 2017-08-12 DIAGNOSIS — R627 Adult failure to thrive: Secondary | ICD-10-CM | POA: Diagnosis present

## 2017-08-12 DIAGNOSIS — E1169 Type 2 diabetes mellitus with other specified complication: Secondary | ICD-10-CM | POA: Diagnosis present

## 2017-08-12 DIAGNOSIS — Z7984 Long term (current) use of oral hypoglycemic drugs: Secondary | ICD-10-CM

## 2017-08-12 DIAGNOSIS — E871 Hypo-osmolality and hyponatremia: Secondary | ICD-10-CM | POA: Diagnosis present

## 2017-08-12 DIAGNOSIS — R531 Weakness: Secondary | ICD-10-CM

## 2017-08-12 DIAGNOSIS — D62 Acute posthemorrhagic anemia: Secondary | ICD-10-CM

## 2017-08-12 DIAGNOSIS — Z87891 Personal history of nicotine dependence: Secondary | ICD-10-CM | POA: Diagnosis not present

## 2017-08-12 DIAGNOSIS — E039 Hypothyroidism, unspecified: Secondary | ICD-10-CM | POA: Diagnosis present

## 2017-08-12 DIAGNOSIS — R269 Unspecified abnormalities of gait and mobility: Secondary | ICD-10-CM | POA: Diagnosis not present

## 2017-08-12 DIAGNOSIS — M4800 Spinal stenosis, site unspecified: Secondary | ICD-10-CM | POA: Diagnosis present

## 2017-08-12 DIAGNOSIS — G319 Degenerative disease of nervous system, unspecified: Secondary | ICD-10-CM | POA: Diagnosis present

## 2017-08-12 DIAGNOSIS — I4891 Unspecified atrial fibrillation: Secondary | ICD-10-CM | POA: Diagnosis present

## 2017-08-12 LAB — URINALYSIS, ROUTINE W REFLEX MICROSCOPIC
Bilirubin Urine: NEGATIVE
GLUCOSE, UA: NEGATIVE mg/dL
Ketones, ur: NEGATIVE mg/dL
LEUKOCYTES UA: NEGATIVE
NITRITE: NEGATIVE
Protein, ur: NEGATIVE mg/dL
SPECIFIC GRAVITY, URINE: 1.011 (ref 1.005–1.030)
pH: 6 (ref 5.0–8.0)

## 2017-08-12 LAB — COMPREHENSIVE METABOLIC PANEL
ALT: 27 U/L (ref 17–63)
AST: 45 U/L — AB (ref 15–41)
Albumin: 3.1 g/dL — ABNORMAL LOW (ref 3.5–5.0)
Alkaline Phosphatase: 66 U/L (ref 38–126)
Anion gap: 9 (ref 5–15)
BILIRUBIN TOTAL: 0.6 mg/dL (ref 0.3–1.2)
BUN: 14 mg/dL (ref 6–20)
CO2: 19 mmol/L — ABNORMAL LOW (ref 22–32)
CREATININE: 1.13 mg/dL (ref 0.61–1.24)
Calcium: 8.9 mg/dL (ref 8.9–10.3)
Chloride: 98 mmol/L — ABNORMAL LOW (ref 101–111)
GFR calc Af Amer: >60 mL/min (ref >60)
GFR calc non Af Amer: >60 mL/min (ref >60)
Glucose, Bld: 131 mg/dL — ABNORMAL HIGH (ref 65–99)
POTASSIUM: 4.2 mmol/L (ref 3.5–5.1)
Sodium: 126 mmol/L — ABNORMAL LOW (ref 135–145)
TOTAL PROTEIN: 6.3 g/dL — AB (ref 6.5–8.1)

## 2017-08-12 LAB — GLUCOSE, CAPILLARY: Glucose-Capillary: 98 mg/dL (ref 65–99)

## 2017-08-12 LAB — CBC WITH DIFFERENTIAL/PLATELET
BASOS ABS: 0.1 10*3/uL (ref 0.0–0.1)
Basophils Relative: 1 %
EOS ABS: 0.1 10*3/uL (ref 0.0–0.7)
Eosinophils Relative: 1 %
HCT: 39.2 % (ref 39.0–52.0)
Hemoglobin: 13.8 g/dL (ref 13.0–17.0)
LYMPHS ABS: 2.2 10*3/uL (ref 0.7–4.0)
Lymphocytes Relative: 22 %
MCH: 34.1 pg — ABNORMAL HIGH (ref 26.0–34.0)
MCHC: 35.2 g/dL (ref 30.0–36.0)
MCV: 96.8 fL (ref 78.0–100.0)
MONO ABS: 2.5 10*3/uL — AB (ref 0.1–1.0)
Monocytes Relative: 25 %
Neutro Abs: 4.9 10*3/uL (ref 1.7–7.7)
Neutrophils Relative %: 51 %
PLATELETS: 180 10*3/uL (ref 150–400)
RBC: 4.05 MIL/uL — AB (ref 4.22–5.81)
RDW: 14.4 % (ref 11.5–15.5)
WBC: 9.8 10*3/uL (ref 4.0–10.5)

## 2017-08-12 LAB — SODIUM, URINE, RANDOM: Sodium, Ur: 68 mmol/L

## 2017-08-12 LAB — OSMOLALITY, URINE: OSMOLALITY UR: 384 mosm/kg (ref 300–900)

## 2017-08-12 MED ORDER — INSULIN ASPART 100 UNIT/ML ~~LOC~~ SOLN
0.0000 [IU] | Freq: Three times a day (TID) | SUBCUTANEOUS | Status: DC
  Administered 2017-08-13 – 2017-08-14 (×2): 3 [IU] via SUBCUTANEOUS
  Administered 2017-08-14 – 2017-08-15 (×3): 1 [IU] via SUBCUTANEOUS
  Administered 2017-08-16: 2 [IU] via SUBCUTANEOUS
  Administered 2017-08-17: 1 [IU] via SUBCUTANEOUS
  Administered 2017-08-17: 2 [IU] via SUBCUTANEOUS
  Administered 2017-08-18: 1 [IU] via SUBCUTANEOUS

## 2017-08-12 MED ORDER — ONDANSETRON HCL 4 MG/2ML IJ SOLN
4.0000 mg | Freq: Four times a day (QID) | INTRAMUSCULAR | Status: DC | PRN
Start: 2017-08-12 — End: 2017-08-18

## 2017-08-12 MED ORDER — PANTOPRAZOLE SODIUM 40 MG PO TBEC
40.0000 mg | DELAYED_RELEASE_TABLET | Freq: Every day | ORAL | Status: DC
  Administered 2017-08-12 – 2017-08-18 (×7): 40 mg via ORAL
  Filled 2017-08-12 (×7): qty 1

## 2017-08-12 MED ORDER — DICLOFENAC SODIUM 1 % TD GEL
4.0000 g | Freq: Four times a day (QID) | TRANSDERMAL | Status: DC | PRN
  Filled 2017-08-12: qty 100

## 2017-08-12 MED ORDER — APIXABAN 5 MG PO TABS
5.0000 mg | ORAL_TABLET | Freq: Two times a day (BID) | ORAL | Status: DC
  Administered 2017-08-12 – 2017-08-18 (×12): 5 mg via ORAL
  Filled 2017-08-12 (×12): qty 1

## 2017-08-12 MED ORDER — LEVOTHYROXINE SODIUM 75 MCG PO TABS
75.0000 ug | ORAL_TABLET | Freq: Every day | ORAL | Status: DC
  Administered 2017-08-13 – 2017-08-18 (×6): 75 ug via ORAL
  Filled 2017-08-12 (×8): qty 1

## 2017-08-12 MED ORDER — SODIUM CHLORIDE 0.9 % IV SOLN
INTRAVENOUS | Status: DC
  Administered 2017-08-12: 20:00:00 via INTRAVENOUS

## 2017-08-12 MED ORDER — METOPROLOL TARTRATE 25 MG PO TABS
25.0000 mg | ORAL_TABLET | Freq: Two times a day (BID) | ORAL | Status: DC
  Administered 2017-08-12 – 2017-08-18 (×10): 25 mg via ORAL
  Filled 2017-08-12 (×10): qty 1

## 2017-08-12 MED ORDER — SODIUM CHLORIDE 0.9 % IV SOLN
INTRAVENOUS | Status: DC
  Administered 2017-08-13: 07:00:00 via INTRAVENOUS

## 2017-08-12 MED ORDER — ONDANSETRON HCL 4 MG PO TABS: 4.0000 mg | ORAL_TABLET | Freq: Four times a day (QID) | ORAL | Status: DC | PRN

## 2017-08-12 MED ORDER — ACETAMINOPHEN 325 MG PO TABS
650.0000 mg | ORAL_TABLET | Freq: Four times a day (QID) | ORAL | Status: DC | PRN
  Administered 2017-08-15 – 2017-08-18 (×5): 650 mg via ORAL
  Filled 2017-08-12 (×5): qty 2

## 2017-08-12 MED ORDER — ACETAMINOPHEN 650 MG RE SUPP
650.0000 mg | Freq: Four times a day (QID) | RECTAL | Status: DC | PRN
  Administered 2017-08-14 (×2): 650 mg via RECTAL
  Filled 2017-08-12 (×2): qty 1

## 2017-08-12 MED ORDER — FOLIC ACID 1 MG PO TABS
1.0000 mg | ORAL_TABLET | Freq: Every day | ORAL | Status: DC
  Administered 2017-08-12 – 2017-08-18 (×7): 1 mg via ORAL
  Filled 2017-08-12 (×7): qty 1

## 2017-08-12 MED ORDER — ROSUVASTATIN CALCIUM 10 MG PO TABS
10.0000 mg | ORAL_TABLET | Freq: Every day | ORAL | Status: DC
  Administered 2017-08-13 – 2017-08-18 (×6): 10 mg via ORAL
  Filled 2017-08-12: qty 2
  Filled 2017-08-12 (×2): qty 1
  Filled 2017-08-12: qty 2
  Filled 2017-08-12: qty 1
  Filled 2017-08-12 (×2): qty 2
  Filled 2017-08-12 (×2): qty 1
  Filled 2017-08-12: qty 2
  Filled 2017-08-12: qty 1
  Filled 2017-08-12: qty 2

## 2017-08-12 NOTE — ED Notes (Signed)
Sent label to main lab for b12 blood test.

## 2017-08-12 NOTE — H&P (Addendum)
History and Physical    [name] YBO:[id number] DOB: 1940-06-25 DOA: 08/12/2017  Referring MD/NP/PA: EDP PCP: Dr.Ramachandran Patient coming from: Home  Chief Complaint: falls, unable to get up from bed and from floor after falls and recent confusion  HPI: [name] is a 77 y.o. 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 Since going home 3/17 his balance has worsened, has fallen numerous times, unable to get up from floor numerous times, some periodic confusion overnight. No fevers or chills, no changes in meds  ED Course: CT head unremarkable, Labs significant for Na of 126, was 134 last week in ER  Review of Systems: As per HPI otherwise 14 point review of systems negative.   Past Medical History:  Diagnosis Date  . DM (diabetes mellitus) ([employer])   . GERD (gastroesophageal reflux disease)   . Hyperlipidemia   . Hypertension   . Legionella pneumonia ([place])   . Osteoarthritis   . Osteopenia   . Rheumatoid arthritis ([place])   . Thyroid disease     No past surgical history on file.   reports that he has quit smoking. His smoking use included cigarettes. He has never used smokeless tobacco. He reports that he drinks alcohol. He reports that he does not use drugs.  Allergies  Allergen Reactions  . Hydrocodone Nausea Only    Family History  Problem Relation Age of Onset  . Diabetes Mellitus I Father   . Diabetes Mellitus I Mother   . CAD Brother      Prior  to Admission medications   Medication Sig Start Date End Date Taking? Authorizing Provider  apixaban (ELIQUIS) 5 MG TABS tablet Take 5 mg by mouth 2 (two) times daily.    [provider]  celecoxib (CELEBREX) 200 MG capsule Take 200 mg by mouth daily.     [provider]  diclofenac sodium (VOLTAREN) 1 % GEL Apply topically 4 (four) times daily.    [provider]  esomeprazole (NEXIUM) 40 MG capsule Take 40 mg by mouth daily after supper.  08/01/15   [provider]  flecainide (TAMBOCOR) 100 MG tablet Take 1 tablet (100 mg total) by mouth every 12 (twelve) hours. 01/16/16   Adrian Prows, MD  folic acid (FOLVITE) 1 MG tablet Take 1 mg by mouth daily.    [provider]  irbesartan (AVAPRO) 150 MG tablet Take 150 mg by mouth daily.  10/25/14   [provider]  levothyroxine (SYNTHROID, LEVOTHROID) 75 MCG tablet Take 75 mcg by mouth daily before breakfast.    [provider]  metFORMIN (GLUCOPHAGE-XR) 500 MG 24 hr tablet Take 1,000 mg by mouth daily after supper.  08/21/15   [provider]  methotrexate (50 MG/ML) 1 g injection Inject into the vein once.    [provider]  metoprolol tartrate (LOPRESSOR) 25 MG tablet Take 1 tablet (25 mg total) by mouth 2 (two) times daily. 01/16/16   Adrian Prows, MD  rosuvastatin (CRESTOR) 10 MG tablet Take 10 mg by mouth daily. 08/28/15  [provider]    Physical Exam: Vitals:   08/12/17 1634 08/12/17 1635 08/12/17 1700 08/12/17 1730  BP: 103/70  108/67 114/67  Pulse: 78  78 77  Resp: 19  14 17   Temp: 98.7 F (37.1 C)     TempSrc: Oral     SpO2: 99%  97% 98%  Weight:  71.7 kg (158 lb)    Height:  5' 6"  (1.676 m)        Constitutional: NAD, calm, comfortable, no distress, oriented x3 Vitals:   08/12/17 1634 08/12/17 1635 08/12/17 1700 08/12/17 1730  BP: 103/70  108/67 114/67  Pulse: 78  78 77  Resp: 19  14 17   Temp: 98.7 F (37.1 C)     TempSrc: Oral     SpO2:  99%  97% 98%  Weight:  71.7 kg (158 lb)    Height:  5' 6"  (1.676 m)     Eyes: PERRL, lids and conjunctivae normal, oral mucosa dry ENMT: Mucous membranes are moist.  Neck: normal, supple Respiratory: CTAB, good air movement.  Cardiovascular: Regular rate and rhythm, no murmurs / rubs / gallops Abdomen: soft, non tender, Bowel sounds positive.  Musculoskeletal: scrapes on both knees Ext: no edema  Skin: scabs on both knees Neurologic: mildly delayed cognition, CN 2-12 grossly intact. Sensation intact, DTR normal. Strength 5/5 in all 4 limbs prox and distal muscle groups Psychiatric: Normal judgment and insight. Alert and oriented x 3. Normal mood.   Labs on Admission: I have personally reviewed following labs and imaging studies  CBC: Recent Labs  Lab 08/05/17 2316 08/05/17 2334  WBC 9.2  --   NEUTROABS 6.5  --   HGB 13.6 13.9  HCT 40.2 41.0  MCV 99.5  --   PLT 98*  --    Basic Metabolic Panel: Recent Labs  Lab 08/05/17 2316 08/05/17 2334  NA 131* 134*  K 4.7 4.7  CL 100* 100*  CO2 18*  --   GLUCOSE 172* 169*  BUN 9 9  CREATININE 1.02 0.90  CALCIUM 9.6  --    GFR: Estimated Creatinine Clearance: 63 mL/min (by C-G formula based on SCr of 0.9 mg/dL). Liver Function Tests: Recent Labs  Lab 08/05/17 2316  AST 56*  ALT 30  ALKPHOS 61  BILITOT 0.6  PROT 6.0*  ALBUMIN 3.2*   No results for input(s): LIPASE, AMYLASE in the last 168 hours. No results for input(s): AMMONIA in the last 168 hours. Coagulation Profile: Recent Labs  Lab 08/05/17 2316  INR 1.64   Cardiac Enzymes: No results for input(s): CKTOTAL, CKMB, CKMBINDEX, TROPONINI in the last 168 hours. BNP (last 3 results) No results for input(s): PROBNP in the last 8760 hours. HbA1C: No results for input(s): HGBA1C in the last 72 hours. CBG: No results for input(s): GLUCAP in the last 168 hours. Lipid Profile: No results for input(s): CHOL, HDL, LDLCALC, TRIG, CHOLHDL, LDLDIRECT in the last 72  hours. Thyroid Function Tests: No results for input(s): TSH, T4TOTAL, FREET4, T3FREE, THYROIDAB in the last 72 hours. Anemia Panel: No results for input(s): VITAMINB12, FOLATE, FERRITIN, TIBC, IRON, RETICCTPCT in the last 72 hours. Urine analysis:    Component Value Date/Time   COLORURINE YELLOW 08/05/2017 2333   APPEARANCEUR CLOUDY (A) 08/05/2017 2333   LABSPEC 1.011 08/05/2017 2333   PHURINE 7.0 08/05/2017 2333   GLUCOSEU NEGATIVE 08/05/2017 2333   HGBUR NEGATIVE 08/05/2017 2333   BILIRUBINUR NEGATIVE 08/05/2017 [place] 08/05/2017 2333   PROTEINUR  NEGATIVE 08/05/2017 2333   NITRITE NEGATIVE 08/05/2017 2333   LEUKOCYTESUR NEGATIVE 08/05/2017 2333   Sepsis Labs: @LABRCNTIP (procalcitonin:4,lacticidven:4) )No results found for this or any previous visit (from the past 240 hour(s)).   Radiological Exams on Admission: No results found.  EKG: Independently reviewed. Sinus rhythm, no acute ST T wave changes  Assessment/Plan    Gait disorder and falls since 3/15 -possibilities include worsening of spondylosis, ? Myelopathy, although MRI L spine last week only showed multi level DJD -and MRI brain 3/17 without acute findings  -CT brain 3/23 unremarkable -unclear if Epidural spinal injection on 3/4 could be contributing -Hyponatremia is likely making him overall more debilitated -will ask Neuro to eval in am, check B12, recent TSH WNL -PT/OT eval -Will most likely need Rehab  Hyponatremia -likely due to dehydration, poor PO intake -hydrate with NS, check urine Na/OSm -unable to maintain adequate hydration with confusion at this time  Acute metabolic encephalopathy -due to above, hydrate, monitor sodium -may have some undiagnosed early dementia or cognitive changes  P.Afib -in NSR,  continue BB and eliquis -looks like flecainide was discontinued  HTN -on ARB and metoprolol, will continue metoprolol and hold ARB wih soft BPs  Hypothyroidism -continue  synthroid, recent TSH ok  DM type 2 -hold metformin, SSI  DVT prophylaxis: on eliquis Code Status: Full COde  Family Communication: Wife [name] and son Mohit at bedside Disposition Plan: Hopefully rehab Consults called: Will ask Neuro to eval in am  Admission status: inpatient   Domenic Polite MD [facility] Pager [phone]  If 7PM-7AM, please contact night-coverage [email or web address] Password [facility]  08/12/2017, 6:23 PM

## 2017-08-12 NOTE — ED Provider Notes (Signed)
[place] EMERGENCY DEPARTMENT Provider Note   CSN: [id number] Arrival date & time: 08/12/17  1621     History   Chief Complaint Chief Complaint  Patient presents with  . Altered Mental Status    HPI [name] is a 77 y.o. male.  Patient is a 77 year old male with a history of hypertension, hyperlipidemia, diabetes and rheumatoid arthritis with spinal stenosis who presents with altered mental status.  The family states that over the last week patient has had multiple falls and unable to ambulate without assistance.  He also has been more withdrawn than normal.  He has had a decreased appetite.  There is been no fevers.  He denies any chest pain or shortness of breath.  No cough or cold symptoms.  No urinary symptoms.  He was seen here a week ago and at that time was confused on arrival however the confusion resolved while he was in the ED.  There is concern for a possible stroke and a CT head was performed as well as a MRI of the brain which did not show any acute abnormalities.  There is also a concern for a lower back injury causing his weakness.  MRI of the lumbar spine was performed which showed no acute abnormalities.  He did have a recent spinal injection.  His son states that at that time he was able to ambulate around the ED with a walker but currently is not able to ambulate without assistance.  He seems to be generally weak without any obvious focal deficits.  They state that he is very unsteady when he tries to stand or walk.     Past Medical History:  Diagnosis Date  . DM (diabetes mellitus) ([place])   . GERD (gastroesophageal reflux disease)   . Hyperlipidemia   . Hypertension   . Legionella pneumonia ([place])   . Osteoarthritis   . Osteopenia   . Rheumatoid arthritis ([place])   . Thyroid disease     Patient Active Problem List   Diagnosis Date Noted  . Spinal stenosis 08/12/2017  . 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 Type 2 diabetes mellitus with  other specified complication ([place]) 29/93/7169  . Hyponatremia 08/12/2017  . Atrial fibrillation ([place]) 01/16/2016  . Rheumatoid arthritis ([place]) 01/16/2016  . History of artificial joint 12/19/2014  . H/O knee surgery 12/19/2014  . Primary osteoarthritis of both knees 10/27/2014  . Osteopenia 03/31/2014  . HEARTBURN 09/04/2009    No past surgical history on file.      Home Medications    Prior to Admission medications   Medication Sig Start Date End Date Taking? Authorizing Provider  apixaban (ELIQUIS) 5 MG TABS tablet Take 5 mg by mouth 2 (two) times daily.   Yes [provider]  diclofenac sodium (VOLTAREN) 1 % GEL Apply topically 4 (four) times daily.   Yes [provider]  esomeprazole (NEXIUM) 40 MG capsule Take 40 mg by mouth daily after supper.  08/01/15  Yes [provider]  folic acid (FOLVITE) 1 MG tablet Take 1 mg by mouth daily.   Yes [provider]  irbesartan (AVAPRO) 150 MG tablet Take 150 mg by mouth daily.  10/25/14  Yes [provider]  levothyroxine (SYNTHROID, LEVOTHROID) 75 MCG tablet Take 75 mcg by mouth daily before breakfast.   Yes [provider]  metFORMIN (GLUCOPHAGE-XR) 500 MG 24 hr tablet Take 500 mg by mouth 2 (two) times daily.  08/21/15  Yes [provider]  methotrexate (50 MG/ML) 1 g injection Inject into the vein once.   Yes [provider]  metoprolol tartrate (LOPRESSOR) 25 MG tablet Take 1 tablet (25 mg total) by mouth 2 (two) times daily. 01/16/16  Yes Adrian Prows, MD  rosuvastatin (CRESTOR) 10 MG tablet Take 10 mg by mouth daily. 08/28/15  Yes [provider]    Family History Family History  Problem Relation Age of Onset  . Diabetes Mellitus I Father   . Diabetes Mellitus I Mother   . CAD Brother     Social History Social History   Tobacco Use  . Smoking status: Former Smoker    Types: Cigarettes  . Smokeless tobacco: Never Used  Substance Use Topics  .  Alcohol use: Yes    Alcohol/week: 0.0 oz    Comment: "once in a while"  . Drug use: No     Allergies   Hydrocodone   Review of Systems Review of Systems  Constitutional: Positive for activity change, appetite change and fatigue. Negative for chills, diaphoresis and fever.  HENT: Negative for congestion, rhinorrhea and sneezing.   Eyes: Negative.   Respiratory: Negative for cough, chest tightness and shortness of breath.   Cardiovascular: Negative for chest pain and leg swelling.  Gastrointestinal: Negative for abdominal pain, blood in stool, diarrhea, nausea and vomiting.  Genitourinary: Negative for difficulty urinating, flank pain, frequency and hematuria.  Musculoskeletal: Positive for back pain. Negative for arthralgias.  Skin: Negative for rash.  Neurological: Positive for weakness. Negative for dizziness, speech difficulty, numbness and headaches.  Psychiatric/Behavioral: Positive for confusion.     Physical Exam Updated Vital Signs BP 114/67   Pulse 77   Temp 98.7 F (37.1 C) (Oral)   Resp 17   Ht 5' 6"  (1.676 m)   Wt 71.7 kg (158 lb)   SpO2 98%   BMI 25.50 kg/m   Physical Exam  Constitutional: He is oriented to person, place, and time. He appears well-developed and well-nourished.  HENT:  Head: Normocephalic and atraumatic.  Eyes: Pupils are equal, round, and reactive to light. EOM are normal.  No nystagmus  Neck: Normal range of motion. Neck supple.  Cardiovascular: Normal rate, regular rhythm and normal heart sounds.  Pulmonary/Chest: Effort normal and breath sounds normal. No respiratory distress. He has no wheezes. He has no rales. He exhibits no tenderness.  Abdominal: Soft. Bowel sounds are normal. There is no tenderness. There is no rebound and no guarding.  Musculoskeletal: Normal range of motion. He exhibits no edema.  No pain along the spine  Lymphadenopathy:    He has no cervical adenopathy.  Neurological: He is alert and oriented to person,  place, and time.  Patient is oriented x3 and able to answer questions although he is slow to answer questions and has to think about things for a while before answering.  At times he initially says the wrong answer but then changes it to the correct answer.  Motor 5 out of 5 all extremities, sensation grossly intact light touch all extremities, finger-nose intact, no pronator drift  Skin: Skin is warm and dry. No rash noted.  Psychiatric: He has a normal mood and affect.     ED Treatments / Results  Labs (all labs ordered are listed, but only abnormal results are displayed) Labs Reviewed  COMPREHENSIVE METABOLIC PANEL - Abnormal; Notable for the following components:      Result Value   Sodium 126 (*)    Chloride 98 (*)  CO2 19 (*)    Glucose, Bld 131 (*)    Total Protein 6.3 (*)    Albumin 3.1 (*)    AST 45 (*)    All other components within normal limits  CBC WITH DIFFERENTIAL/PLATELET - Abnormal; Notable for the following components:   RBC 4.05 (*)    MCH 34.1 (*)    Monocytes Absolute 2.5 (*)    All other components within normal limits  URINALYSIS, ROUTINE W REFLEX MICROSCOPIC - Abnormal; Notable for the following components:   Hgb urine dipstick SMALL (*)    Bacteria, UA RARE (*)    Squamous Epithelial / LPF 0-5 (*)    All other components within normal limits  OSMOLALITY, URINE  SODIUM, URINE, RANDOM    EKG EKG Interpretation  Date/Time:  Saturday August 12 2017 18:02:27 EDT Ventricular Rate:  73 PR Interval:    QRS Duration: 112 QT Interval:  408 QTC Calculation: 450 R Axis:   92 Text Interpretation:  Sinus rhythm Borderline intraventricular conduction delay Low voltage, precordial leads since last tracing no significant change Confirmed by Malvin Johns [phone]) on 08/12/2017 6:30:04 PM   Radiology No results found.  Procedures Procedures (including critical care time)  Medications Ordered in ED Medications  0.9 %  sodium chloride infusion (has no  administration in time range)  insulin aspart (novoLOG) injection 0-9 Units (has no administration in time range)     Initial Impression / Assessment and Plan / ED Course  I have reviewed the triage vital signs and the nursing notes.  Pertinent labs & imaging results that were available during my care of the patient were reviewed by me and considered in my medical decision making (see chart for details).     Patient is a 77 year old male who presents with generalized weakness and difficulty walking.  He has had several falls.  I do not really get the story that he is having a true ataxia.  I do not find any focal neurologic deficits on exam.  He does not have any signs of central cord syndrome.  His labs were reviewed and show a significant hyponatremia which may be contributing to his symptoms.  There is no signs of a UTI.  No suggestions of other infections.  I did a repeat head CT which shows no evidence of subdural hematoma or other acute abnormality.  I discussed the findings with Dr. Broadus John who will admit the patient for further treatment.  Final Clinical Impressions(s) / ED Diagnoses   Final diagnoses:  Weakness  Hyponatremia    ED Discharge Orders    None       Malvin Johns, MD 08/12/17 2042

## 2017-08-12 NOTE — ED Triage Notes (Signed)
Per EMS pt has been acting out of source according to family. Pt. Is more altered than normal. Alert x4 but just has a hard time following things correctly. HX a-fib Pt was just here a few weeks ago for same symptoms.115/67, 80 p, 98% ra, cbg 159

## 2017-08-12 NOTE — ED Notes (Signed)
Pt has lacerations to both knees. Bruising and different stages of healing.

## 2017-08-13 ENCOUNTER — Inpatient Hospital Stay (HOSPITAL_COMMUNITY): Payer: Medicare Other

## 2017-08-13 DIAGNOSIS — R531 Weakness: Secondary | ICD-10-CM

## 2017-08-13 LAB — BASIC METABOLIC PANEL
Anion gap: 12 (ref 5–15)
Anion gap: 9 (ref 5–15)
BUN: 9 mg/dL (ref 6–20)
BUN: 8 mg/dL (ref 6–20)
CHLORIDE: 97 mmol/L — AB (ref 101–111)
CO2: 18 mmol/L — AB (ref 22–32)
CO2: 21 mmol/L — ABNORMAL LOW (ref 22–32)
CREATININE: 0.88 mg/dL (ref 0.61–1.24)
Calcium: 8.5 mg/dL — ABNORMAL LOW (ref 8.9–10.3)
Calcium: 8.5 mg/dL — ABNORMAL LOW (ref 8.9–10.3)
Chloride: 95 mmol/L — ABNORMAL LOW (ref 101–111)
Creatinine, Ser: 0.84 mg/dL (ref 0.61–1.24)
GFR calc Af Amer: >60 mL/min (ref >60)
GFR calc non Af Amer: >60 mL/min (ref >60)
Glucose, Bld: 123 mg/dL — ABNORMAL HIGH (ref 65–99)
Glucose, Bld: 121 mg/dL — ABNORMAL HIGH (ref 65–99)
POTASSIUM: 3.5 mmol/L (ref 3.5–5.1)
Potassium: 4.1 mmol/L (ref 3.5–5.1)
SODIUM: 125 mmol/L — AB (ref 135–145)
Sodium: 127 mmol/L — ABNORMAL LOW (ref 135–145)

## 2017-08-13 LAB — GLUCOSE, CAPILLARY
GLUCOSE-CAPILLARY: 208 mg/dL — AB (ref 65–99)
GLUCOSE-CAPILLARY: 117 mg/dL — AB (ref 65–99)
Glucose-Capillary: 117 mg/dL — ABNORMAL HIGH (ref 65–99)
Glucose-Capillary: 170 mg/dL — ABNORMAL HIGH (ref 65–99)

## 2017-08-13 LAB — T4, FREE: Free T4: 1.89 ng/dL — ABNORMAL HIGH (ref 0.61–1.12)

## 2017-08-13 LAB — CBC
HCT: 37.7 % — ABNORMAL LOW (ref 39.0–52.0)
HEMOGLOBIN: 13 g/dL (ref 13.0–17.0)
MCH: 33.1 pg (ref 26.0–34.0)
MCHC: 34.5 g/dL (ref 30.0–36.0)
MCV: 95.9 fL (ref 78.0–100.0)
Platelets: 185 10*3/uL (ref 150–400)
RBC: 3.93 MIL/uL — ABNORMAL LOW (ref 4.22–5.81)
RDW: 13.8 % (ref 11.5–15.5)
WBC: 9.9 10*3/uL (ref 4.0–10.5)

## 2017-08-13 LAB — TSH: TSH: 2.228 u[IU]/mL (ref 0.350–4.500)

## 2017-08-13 LAB — HEMOGLOBIN A1C
Hgb A1c MFr Bld: 6.2 % — ABNORMAL HIGH (ref 4.8–5.6)
MEAN PLASMA GLUCOSE: 131.24 mg/dL

## 2017-08-13 LAB — VITAMIN B12: Vitamin B-12: 688 pg/mL (ref 180–914)

## 2017-08-13 LAB — AMMONIA: AMMONIA: 13 umol/L (ref 9–35)

## 2017-08-13 MED ORDER — M.V.I. ADULT IV INJ
Freq: Once | INTRAVENOUS | Status: AC
  Administered 2017-08-13: 09:00:00 via INTRAVENOUS
  Filled 2017-08-13: qty 10

## 2017-08-13 MED ORDER — VITAMIN B-12 1000 MCG PO TABS
1000.0000 ug | ORAL_TABLET | Freq: Every day | ORAL | Status: DC
  Administered 2017-08-13 – 2017-08-18 (×6): 1000 ug via ORAL
  Filled 2017-08-13 (×6): qty 1

## 2017-08-13 MED ORDER — FUROSEMIDE 20 MG PO TABS
20.0000 mg | ORAL_TABLET | Freq: Every day | ORAL | Status: DC
  Administered 2017-08-13: 20 mg via ORAL
  Filled 2017-08-13: qty 1

## 2017-08-13 MED ORDER — LORAZEPAM 2 MG/ML IJ SOLN
0.5000 mg | Freq: Four times a day (QID) | INTRAMUSCULAR | Status: AC | PRN
  Administered 2017-08-13 – 2017-08-16 (×2): 0.5 mg via INTRAVENOUS
  Filled 2017-08-13 (×2): qty 1

## 2017-08-13 MED ORDER — ALPRAZOLAM 0.5 MG PO TABS
0.5000 mg | ORAL_TABLET | Freq: Once | ORAL | Status: AC
  Administered 2017-08-13: 0.5 mg via ORAL
  Filled 2017-08-13: qty 1

## 2017-08-13 MED ORDER — CHLORPROMAZINE HCL 25 MG/ML IJ SOLN
25.0000 mg | Freq: Four times a day (QID) | INTRAMUSCULAR | Status: DC | PRN
  Administered 2017-08-14: 25 mg via INTRAVENOUS
  Filled 2017-08-13 (×2): qty 1

## 2017-08-13 MED ORDER — ZOLPIDEM TARTRATE 5 MG PO TABS: 5.0000 mg | ORAL_TABLET | Freq: Once | ORAL | Status: DC

## 2017-08-13 MED ORDER — SODIUM CHLORIDE 1 G PO TABS
1.0000 g | ORAL_TABLET | Freq: Two times a day (BID) | ORAL | Status: DC
  Administered 2017-08-13 – 2017-08-14 (×3): 1 g via ORAL
  Filled 2017-08-13 (×4): qty 1

## 2017-08-13 MED ORDER — THIAMINE HCL 100 MG/ML IJ SOLN
100.0000 mg | Freq: Every day | INTRAMUSCULAR | Status: DC
  Administered 2017-08-13: 100 mg via INTRAVENOUS
  Filled 2017-08-13: qty 2

## 2017-08-13 NOTE — Consult Note (Addendum)
NEUROHOSPITALISTS -  CONSULTATION NOTE   Requesting Physician: Dr. Fanny Bien [facility]: Dr. Jerelyn Charles  Admit date: 08/12/2017    Chief Complaint: Weakness and confusion  History obtained from: Patient, Patients son at bedside, Attending and Chart     HPI   [name] is an 77 Kaufman.o. male with Russell PMH of AFIB on Eliquis, DM, Hypothyroidism, RA, Hx of lumbosacral spondylosis, osteoarthritis of both knees, Hx of recent Epidural spinal injection on 3/4 and Knee injection on 07/22/2017.  He had Russell recent ED visit on 08/05/2017 for complaints of frequent falls, MRI Brain and MRI L spine noted no acute findings. He now returns for worsening of his weakness, ataxic gait and worsening confusion.  His son at bedside denies any recent illnesses or travel. No reports of N/V/D. Denies any H/Russell or dizziness. Patient states his DM is well controlled and he has no neuropathy. He has started to have some new urinary retention and hiccup complaints since his weakness started. His sons tells [place] that his father drinks nightly but has not had anything to drink in over 4 days now. He has been sundowning for the past 10 years which has been progressively getting worse but with no aggressive or acute agitation behavior noted. The patient has never has any formal neurological testing or evaluation. No formal neuropsych testing done either.  Patient is Russell retired Programmer, multimedia and at baseline was Russell very high functioning individual.  Family has noted that he has been having gradual memory decline but has been more stark in the past few weeks to months.  Past Medical History    Past Medical History:  Diagnosis Date  . DM (diabetes mellitus) ([place])   . GERD (gastroesophageal reflux disease)   . Hyperlipidemia   . Hypertension   . Legionella pneumonia ([place])   . Osteoarthritis   . Osteopenia   . Rheumatoid  arthritis ([place])   . Thyroid disease    Past Surgical History  No past surgical history on file.  Family History   Family History  Problem Relation Age of Onset  . Diabetes Mellitus I Father   . Diabetes Mellitus I Mother   . CAD Brother    Social History   reports that he has quit smoking. His smoking use included cigarettes. He has never used smokeless tobacco. He reports that he drinks alcohol. He reports that he does not use drugs. Has Russell history of continuing alcohol use   Allergies   Allergies  Allergen Reactions  . Hydrocodone Nausea Only   Medications Prior to Admission   No current facility-administered medications on file prior to encounter.    Current Outpatient Medications on File Prior to Encounter  Medication Sig Dispense Refill  . apixaban (ELIQUIS) 5 MG TABS tablet Take 5 mg by mouth 2 (two) times daily.    . diclofenac sodium (VOLTAREN) 1 % GEL Apply topically 4 (four) times daily.    Marland Kitchen esomeprazole (NEXIUM) 40 MG capsule Take 40 mg by mouth daily after supper.   1  . folic acid (FOLVITE) 1 MG tablet Take 1 mg by mouth daily.    . irbesartan (AVAPRO) 150 MG tablet Take 150 mg by mouth daily.     Marland Kitchen levothyroxine (SYNTHROID, LEVOTHROID) 75 MCG tablet Take 75 mcg by mouth daily before breakfast.    . metFORMIN (GLUCOPHAGE-XR) 500 MG 24 hr tablet Take 500 mg by mouth 2 (two) times daily.   0  . methotrexate (  50 MG/ML) 1 g injection Inject into the vein once.    . metoprolol tartrate (LOPRESSOR) 25 MG tablet Take 1 tablet (25 mg total) by mouth 2 (two) times daily. 180 tablet 2  . rosuvastatin (CRESTOR) 10 MG tablet Take 10 mg by mouth daily.  0   ROS  History obtained from son at bedside General ROS: negative for - chills, fatigue, fever, night sweats, weight gain or weight loss Psychological ROS: positive for - sundowning every evening Ophthalmic ROS: negative for - blurry vision, double vision, eye pain or loss of vision ENT ROS: negative for - epistaxis,  nasal discharge, oral lesions, sore throat, tinnitus or vertigo Allergy and Immunology ROS: negative for - hives or itchy/watery eyes Hematological and Lymphatic ROS: negative for - bleeding problems, bruising or swollen lymph nodes Endocrine ROS: negative for - galactorrhea, hair pattern changes, polydipsia/polyuria or temperature intolerance Respiratory ROS: negative for - cough, hemoptysis, shortness of breath or wheezing Cardiovascular ROS: negative for - chest pain, dyspnea on exertion, edema or irregular heartbeat Gastrointestinal ROS: positive for - frequent hiccups and urinary retention since weakness started Genito-Urinary ROS: negative for - dysuria, hematuria, incontinence or urinary frequency/urgency Musculoskeletal ROS: positive for - generalized muscular weakness Neurological ROS: as noted in HPI Dermatological ROS: negative for rash and skin lesion changes  Physical Examination   Vitals:   08/13/17 0038 08/13/17 0402 08/13/17 0442 08/13/17 0935  BP: 116/67 133/69  98/60  Pulse: 74 77  81  Resp: 18 18  17   Temp: 98.7 F (37.1 C) 99.9 F (37.7 C) 99.6 F (37.6 C) 98.8 F (37.1 C)  TempSrc: Oral Oral  Oral  SpO2: 100% 94%  97%  Weight:      Height:       HEENT-  Normocephalic,  Cardiovascular - Regular rate and rhythm  Respiratory - Lungs clear bilaterally. Non-labored breathing, No wheezing. Abdomen - soft and non-tender, BS normal Extremities- no edema or cyanosis Skin-warm and dry, multiple bruises  Neurological Examination  Mental Status: Alert to self and son, attempts to confabulate answers to questions asked or speaks off topic. Speech fluent without evidence of aphasia or neglect.  Able to follow simple commands with repeated requests and mimic but is easily confused.  Cranial Nerves: II: Visual Fields appear full. Pupils are equal, round, and reactive to light.   III,IV, VI: EOMI without ptosis or diploplia. No nystagmus.   V: Facial sensation is  symmetric to temperature VII: Facial movement is symmetric.  VIII: hearing is intact to voice X: Uvula elevates symmetrically XI: Shoulder shrug is symmetric. XII: tongue is midline without atrophy or fasciculations.  Motor: Tone is normal. Bulk is normal.  4+-5/5 strength was present in all four extremities.  Sensor and Vibration: Both are likely decreased bilaterally in LE's. Patient not always consistent with his answers on testing  Deep Tendon Reflexes: 2+ and symmetric throughout in the biceps and 1+ patellae, no ankle reflexes bilaterally Plantars: Toes are downgoing bilaterally.  Cerebellar: mostly normal finger-to-nose, ataxic heel-to-shin test - had difficult time following instructions for this testing despite mimic Gait: not tested. Stood by the side of bed with +Romberg. Truncal ataxia in bed  Laboratory Results  CBC: Recent Labs  Lab 08/12/17 1801 08/13/17 0502  WBC 9.8 9.9  HGB 13.8 13.0  HCT 39.2 37.7*  MCV 96.8 95.9  PLT 180 308   Basic Metabolic Panel: Recent Labs  Lab 08/12/17 1801 08/13/17 0502  NA 126* 127*  K 4.2 4.1  CL 98* 97*  CO2 19* 18*  GLUCOSE 131* 123*  BUN 14 9  CREATININE 1.13 0.88  CALCIUM 8.9 8.5*   Recent Labs    08/13/17 0502  VITAMINB12 688  Urinalysis:  Recent Labs  Lab 08/12/17 1933  COLORURINE YELLOW  APPEARANCEUR CLEAR  LABSPEC 1.011  PHURINE 6.0  GLUCOSEU NEGATIVE  HGBUR SMALL*  BILIRUBINUR NEGATIVE  KETONESUR NEGATIVE  PROTEINUR NEGATIVE  NITRITE NEGATIVE  LEUKOCYTESUR NEGATIVE   Imaging Results  Ct Head Wo Contrast  Result Date: 08/12/2017 CLINICAL DATA:  Altered level of consciousness EXAM: CT HEAD WITHOUT CONTRAST TECHNIQUE: Contiguous axial images were obtained from the base of the skull through the vertex without intravenous contrast. COMPARISON:  CT 08/05/2017, MRI 08/06/2017 FINDINGS: BRAIN: There is sulcal and ventricular prominence consistent with superficial and central atrophy. No intraparenchymal  hemorrhage, mass effect nor midline shift. Periventricular and subcortical white matter hypodensities consistent with chronic small vessel ischemic disease are identified. No acute large vascular territory infarcts. No abnormal extra-axial fluid collections. Basal cisterns and fourth ventricle are not effaced and midline. VASCULAR: Moderate calcific atherosclerosis of the carotid siphons. SKULL: No skull fracture. No significant scalp soft tissue swelling. SINUSES/ORBITS: The mastoid air-cells are clear. The included paranasal sinuses are well-aerated.The included ocular globes and orbital contents are non-suspicious. OTHER: None. 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 No acute intracranial abnormality. Cerebral atrophy. Electronically Signed   By: Ashley Royalty M.D.   On: 08/12/2017 19:31    IMPRESSION AND PLAN  [name] is Russell 77 Kaufman.o. male with PMH of PMH of AFIB on Eliquis, DM, Hypothyroidism, RA, Hx of lumbosacral spondylosis, osteoarthritis of both knees, Hx of recent Epidural spinal injection on 3/4 and Knee injection on 07/22/2017.  Patient admitted for worsening generalized weakness, ataxic gait and worsening confusion.  Altered Mental Status with undiagnosed, underlying Dementia and 10 year hx of sundowning Longstanding history of Alcoholism  Generalized weakness - likely due to hyponatremia, underlying neuropathy and deconditioning  Outstanding Work-up Studies:     TSH, Free T4, Thiamine, Folate levels, RPR  PLAN  08/13/2017  Check TSH, Free T4, Thiamine, Folate levels, RPR IV Multivitamin x 1 IV Thiamine Folic Acid and D32 PO CIWA, if necessary Evaluation for CIR - Will need intensive Rehab to return to baseline Contact NS to review Lumbar imaging to rule out acute lumbar stenosis or compression or any need for emergent procedure - less likely but he is uncooperative on exam and that makes clinical decision making  difficult. Outpatient Neurology Neuro-cognitive testing  Follow up Appointments  Follow Up:  Follow-up Information    [facility]. Schedule an appointment as soon as possible for Russell visit in 1 week(s).   Specialty:  Neurology Why:  Neurocognitive Testing Contact information: [address] [place] [phone]         Merrilee Seashore, MD -PCP Follow up in 1-2 weeks      Assessment and plan discussed with with attending physician and they are in agreement.    Mary Sella, ANP-C [facility] 08/13/2017, 10:09 AM  08/13/2017 ATTENDING ASSESSMENT  I have seen and examined the patient I agree with the history and physical documented above.  On my examination, patient was not oriented to place.  He was oriented to person.  He could tell me it is the month of March but said the year was 2024.  Could not get the correct date. He was  unable to name his family members when asked by his son. His naming was intact.  His comprehension was impaired and did not follow all commands consistently. Repetition is also impaired. Cranial nerves: Pupils equal round reactive to light, extraocular movement intact, visual fields full, facial sensation intact, face symmetric, auditory acuity grossly intact, palate elevates midline, shoulder shrug intact, tongue midline. Motor exam: Bilateral upper extremities 5/5 with no asterixis.  Normal tone. Bilateral lower extremities with at least Russell 4 out of 5 but did not follow instructions to keep his leg above the bed and his leg kept falling to the bed.  He also complained of discomfort and pain in his legs which is making him not cooperate for the formal strength testing. Sensory exam: Light touch and vibratory sense markedly reduced in Russell stocking pattern in both lower extremities.  His answers were inconsistent.  Initially he said that he could not feel the tuning fork on his toes but later  when the tuning fork was touched to his hands and fingers and then back to his toes he said he felt it on his toes. Deep tendon reflexes: 2+ biceps bilaterally, 2+ brachial radialis bilaterally, 1+ patellar's bilaterally, absent ankle jerks bilaterally.  Toes downgoing bilaterally. Cerebellar: Seem to have intact finger-nose-finger test but was ataxic on heel knee shin test-again he did not follow instructions very well but did have ataxia in the lower extremity.  He also had truncal ataxia while trying to stand up. Gait: Attempted to walk the patient but he was very unsteady.  He stood by the side of the bed with the walker had Russell positive Romberg's and truncal ataxia.  Labs showed normal vitamin B12 levels at 688.Marland Kitchen  Hyponatremia.  Hyperglycemia.  Hemoglobin A1c 6.2.  TSH 2.2.  Imaging-recent MRI personally reviewed.  Done last week.  Shows no acute changes.  Shows generalized atrophy, which I would say is disproportionate to his age.  No imaging suggestion of NPH. MRI of the lumbar spine shows multilevel degenerative disc disease along with unchanged appearance of moderate right L2-3 and moderate left L3-4 neuroforaminal stenosis along with L3-4 retrolisthesis with no spinal canal stenosis.  Impression: 77 year old with above said past medical history admitted with generalized weakness, ataxic gait and worsening confusion.  His memory has been consistently getting worse but this change is rather stark over the last few days to weeks. He does have Russell history of sundowning for the past 10 years and has not had Russell formal neuropsych evaluation. He has Russell long-standing history of alcoholism.  No history of strokes. 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  He also has Russell component of dementia, which has probably been undiagnosed and has been unmasked in the setting of an acute illness.  Recommendations as above, which I have discussed, reviewed and edited.  His son, who is Russell Pension scheme manager, was at bedside-I have relayed my plan to him as well.  Please page stroke NP/ PA / MD from 8am -4 pm   You can look them up on [email or web address]  Password [email or web address]

## 2017-08-13 NOTE — Progress Notes (Signed)
Patient brought down for MRI. Patient attempted however patient was moving everywhere.  Patient is confused and does not follow commands.  RN informed.

## 2017-08-13 NOTE — Progress Notes (Signed)
Called into room several times by wife because patient is not sleeping and moving in the bed.  Patient states, "no pain."  Wife wanted something for sleep explained it was after 2am, per policy cannot give medication for sleep after this time.  Also advised about AMS, and giving sedation medication to these patients could mask symptom changes.  Wife says patient is trying to get out of bed.  Bed alarm set on most sensitive setting and has not gone out unless staff was in room assisting patient with linen changes or peri care.

## 2017-08-13 NOTE — Progress Notes (Signed)
Messaged [facility] per constant hiccups since 730p, noisy grunting noises at rest, lungs sound clear, bowel sounds present, snoring sounds, mouth breathing while asleep with throat gurgles, able to swallow pills and water, but drifts to sleep with above noises after haven been given Lorazepam IV.   Hiccups subside after Lorazepam, softer snoring noises and much calmer.  RR 22, spO2 97%

## 2017-08-13 NOTE — Evaluation (Signed)
Physical Therapy Evaluation Patient Details Name: [name] MRN: [id number] DOB: 1941-04-25 Today's Date: 08/13/2017   History of Present Illness    77 y.o. male with a PMH of AFIB on Eliquis, DM, Hypothyroidism, RA, Hx of lumbosacral spondylosis, osteoarthritis of both knees, Hx of recent Epidural spinal injection on 3/4 and Knee injection on 07/22/2017.  He had a recent ED visit on 08/05/2017 for complaints of frequent falls, MRI Brain and MRI L spine noted no acute findings. He now returns for worsening of his weakness, ataxic gait and worsening confusion.    Clinical Impression  Pt presents with significant limitations to functional mobility due to weakness, poor motor control/motor planning, impaired cognition resulting in dependency in basic tasks like changing positions, moving around and walking.  Per son, 1 week ago pt was ambulatory with device.  Son's goal is for patient to return to that functional level before returning home.  Recommend postacute venue for PT at d/c, have asked for inpt rehab screening to determine appropriateness of that setting.  PT will initiate rehab in acute setting and assist with updating d/c recommendations as needed.  See below for details of exam findings and care plan for goals of care.  Thank you.    Follow Up Recommendations CIR(if not a CIR candidate, pt will need postacute rehab )    Equipment Recommendations  Rolling walker with 5" wheels    Recommendations for Other Services Rehab consult     Precautions / Restrictions Precautions Precautions: Fall Precaution Comments: up with assist only      Mobility  Bed Mobility Overal bed mobility: Needs Assistance Bed Mobility: Supine to Sit;Sit to Supine     Supine to sit: Mod assist Sit to supine: Mod assist   General bed mobility comments: needs assist to carry out activity/understand and act on command  Transfers Overall transfer level: Needs assistance Equipment used: Rolling  walker (2 wheeled) Transfers: Sit to/from Stand Sit to Stand: Mod assist         General transfer comment: generally needs assist to initiate task as commanded and to redirect attention to task  Ambulation/Gait Ambulation/Gait assistance: Mod assist Ambulation Distance (Feet): 20 Feet Assistive device: 4-wheeled walker Gait Pattern/deviations: Step-through pattern;Decreased stride length;Shuffle     General Gait Details: with RW, pt needs redirection to keep hands on hand grips; needs cues and assist for more difficult navigation around door and into bathroom to stand over toliet, but once in a simple setting, can walk in good rhythm (with constant hands on assist) fairly well.  Stairs            Wheelchair Mobility    Modified Rankin (Stroke Patients Only)       Balance Overall balance assessment: Needs assistance Sitting-balance support: Feet supported;Bilateral upper extremity supported Sitting balance-Leahy Scale: Poor Sitting balance - Comments: can sit unassisted but if distracted or perturbated is slow to or does not engage protective reflexes to prevent fall or re-assume neutral sitting Postural control: Posterior lean Standing balance support: Bilateral upper extremity supported;During functional activity Standing balance-Leahy Scale: Poor Standing balance comment: dependent on external support for stability in static and dynamic activities                             Pertinent Vitals/Pain Pain Assessment: Faces Faces Pain Scale: No hurt    Home Living Family/patient expects to be discharged to:: Inpatient rehab  Additional Comments: son specifically requesting inpt rehab    Prior Function Level of Independence: Independent with assistive device(s)         Comments: 1 week ago pt able to walk around ED with walker     Hand Dominance        Extremity/Trunk Assessment   Upper Extremity Assessment Upper  Extremity Assessment: Overall WFL for tasks assessed;Difficult to assess due to impaired cognition    Lower Extremity Assessment Lower Extremity Assessment: Overall WFL for tasks assessed;Difficult to assess due to impaired cognition    Cervical / Trunk Assessment Cervical / Trunk Assessment: Normal  Communication   Communication: Receptive difficulties;Expressive difficulties(due to cognition)  Cognition Arousal/Alertness: Awake/alert Behavior During Therapy: Restless Overall Cognitive Status: History of cognitive impairments - at baseline Area of Impairment: Orientation;Following commands;Awareness                 Orientation Level: Disoriented to;Person;Place;Time;Situation     Following Commands: Follows one step commands inconsistently;Follows one step commands with increased time   Awareness: (none)   General Comments: consistent with dementia, pt verbally agrees to or repeats questions (ie when asked if done urinating, says yes; when asked if still needs to urinate, says yes.  when told "walk over there" pt repeats      General Comments      Exercises     Assessment/Plan    PT Assessment Patient needs continued PT services  PT Problem List Decreased activity tolerance;Decreased balance;Decreased mobility;Decreased coordination;Decreased cognition;Decreased knowledge of use of DME;Decreased safety awareness;Decreased knowledge of precautions       PT Treatment Interventions DME instruction;Gait training;Functional mobility training;Therapeutic activities;Therapeutic exercise;Balance training;Neuromuscular re-education;Cognitive remediation;Patient/family education    PT Goals (Current goals can be found in the Care Plan section)  Acute Rehab PT Goals Patient Stated Goal: unable; for son goal is to walk with device unassisted PT Goal Formulation: With family Time For Goal Achievement: 08/27/17 Potential to Achieve Goals: Fair    Frequency Min 3X/week    Barriers to discharge        Co-evaluation               AM-PAC PT "6 Clicks" Daily Activity  Outcome Measure Difficulty turning over in bed (including adjusting bedclothes, sheets and blankets)?: A Lot Difficulty moving from lying on back to sitting on the side of the bed? : A Lot Difficulty sitting down on and standing up from a chair with arms (e.g., wheelchair, bedside commode, etc,.)?: A Lot Help needed moving to and from a bed to chair (including a wheelchair)?: A Lot Help needed walking in hospital room?: A Lot Help needed climbing 3-5 steps with a railing? : Total 6 Click Score: 11    End of Session Equipment Utilized During Treatment: Gait belt Activity Tolerance: Patient tolerated treatment well Patient left: in bed;with call bell/phone within reach;with nursing/sitter in room;with family/visitor present Nurse Communication: Mobility status PT Visit Diagnosis: Unsteadiness on feet (R26.81);Other abnormalities of gait and mobility (R26.89);Other symptoms and signs involving the nervous system (R29.898)    Time: 4163-8453 PT Time Calculation (min) (ACUTE ONLY): 34 min   Charges:   PT Evaluation $PT Eval Moderate Complexity: 1 Mod PT Treatments $Gait Training: 8-22 mins $Therapeutic Activity: 8-22 mins   PT G Codes:       Kearney Hard, PT, DPT, MS Board Certified Geriatric Clinical Specialist  Herbie Drape 08/13/2017, 12:46 PM

## 2017-08-13 NOTE — Progress Notes (Signed)
PROGRESS NOTE    GENTLE HOGE  ZSW:[id number] DOB: 24-Apr-1941 DOA: 08/12/2017 PCP: Merrilee Seashore, MD  Brief Narrative:  [name] is a 77 y.o. 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 Since going home 3/17 his balance has worsened, has fallen numerous times, unable to get up from floor numerous times, and increased confusion.   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 also contributing -I also discussed with Neurosurgery Dr.Cabell, he reviewed images and recommended consideration of MRI T spine to eval for posterior cord/dorsal column lesions -PT consulting, will need Rehab, hopeful for CIR -B12 -688, TSH is ok, t4 is not of value while on synthroid replacement  Hyponatremia -labs Urine Na 68 and 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 -trigger unclear -stop IVF, started Fluid restriction and low dose Lasix 77m Po x1 -recheck Na this pm and tomorrow  Urinary retention -likely due to BPH, was able to void 100cc in  bathroom when he walked with PT -per wife long H/o nocturia, uses bathroom 2-3times every night, never seen a Urologist -foley placed due to recurrent retention -voiding trial when more ambulatory -hold off on FLomax at this time due to soft BP and risk of Orthostatic hypotension in elderly, if BP improves will consider  Acute Delirium -multifactorial, hyponatremia, poor sleep, hospital induced delirium, and 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 -monitor and Rx Na, Ativan very low dose as needed -add low dose benzo at bedtime   P.Afib -in NSR,  continue BB and eliquis -looks like flecainide was discontinued  HTN -on ARB and metoprolol, will continue metoprolol and hold ARB wih soft BPs  Hypothyroidism -continue synthroid, recent TSH ok  DM type 2 -hold metformin, SSI  DVT prophylaxis: on eliquis Code Status: Full COde  Family Communication: Wife [name] and son Mohit at bedside Disposition Plan: Hopefully rehab-CIR     Consultants:   NEuro  D/w NSG Dr.Cabel   Procedures:   Antimicrobials:    Subjective: -didn't sleep at all, periodic confusion, recurrent urinary retention  Objective: Vitals:   08/13/17 0402 08/13/17 0442 08/13/17 0935 08/13/17 1317  BP: 133/69  98/60 (!) 93/57  Pulse: 77  81 80  Resp: 18  17 17   Temp: 99.9 F (37.7 C) 99.6 F (37.6 C) 98.8 F (37.1 C) 98.5 F (36.9 C)  TempSrc: Oral  Oral Oral  SpO2: 94%  97% 99%  Weight:      Height:        Intake/Output Summary (Last 24 hours) at 08/13/2017 1442 Last data filed at 08/13/2017 1015 Gross per 24 hour  Intake 1240 ml  Output 1750 ml  Net -510 ml  Filed Weights   08/12/17 1635 08/12/17 2213  Weight: 71.7 kg (158 lb) 69.4 kg (153 lb)    Examination:  General exam: alert, awake, oriented to self only, confused about place and time this afternoon Respiratory system: Clear to auscultation. Respiratory effort normal. Cardiovascular system: S1 & S2 heard, RRR. No JVD,  murmurs, Gastrointestinal system: Abdomen is nondistended, soft and nontender.Normal bowel sounds heard. Central nervous system: some confusion noted, cranial nerves intact, Motor 5/5 in upper and lower ext Sensory -light touch decraesed but confused and hence limited exam, confused about proprioception Extremities: no edema Skin: No rashes, lesions or ulcers Psychiatry: pleasant but confused   Data Reviewed:   CBC: Recent Labs  Lab 08/12/17 1801 08/13/17 0502  WBC 9.8 9.9  NEUTROABS 4.9  --   HGB 13.8 13.0  HCT 39.2 37.7*  MCV 96.8 95.9  PLT 180 009   Basic Metabolic Panel: Recent Labs  Lab 08/12/17 1801 08/13/17 0502  NA 126* 127*  K 4.2 4.1  CL 98* 97*  CO2 19* 18*  GLUCOSE 131* 123*  BUN 14 9  CREATININE 1.13 0.88  CALCIUM 8.9 8.5*   GFR: Estimated Creatinine Clearance: 66.8 mL/min (by C-G formula based on SCr of 0.88 mg/dL). Liver Function Tests: Recent Labs  Lab 08/12/17 1801  AST 45*  ALT 27  ALKPHOS 66  BILITOT 0.6  PROT 6.3*  ALBUMIN 3.1*   No results for input(s): LIPASE, AMYLASE in the last 168 hours. No results for input(s): AMMONIA in the last 168 hours. Coagulation Profile: No results for input(s): INR, PROTIME in the last 168 hours. Cardiac Enzymes: No results for input(s): CKTOTAL, CKMB, CKMBINDEX, TROPONINI in the last 168 hours. BNP (last 3 results) No results for input(s): PROBNP in the last 8760 hours. HbA1C: Recent Labs    08/13/17 0815  HGBA1C 6.2*   CBG: Recent Labs  Lab 08/12/17 2228 08/13/17 0626 08/13/17 1150  GLUCAP 98 117* 208*   Lipid Profile: No results for input(s): CHOL, HDL, LDLCALC, TRIG, CHOLHDL, LDLDIRECT in the last 72 hours. Thyroid Function Tests: Recent Labs    08/13/17 0859  TSH 2.228  FREET4 1.89*   Anemia Panel: Recent Labs    08/13/17 0502  VITAMINB12 688   Urine analysis:    Component Value Date/Time   COLORURINE YELLOW 08/12/2017 1933   APPEARANCEUR CLEAR 08/12/2017 1933   LABSPEC  1.011 08/12/2017 1933   PHURINE 6.0 08/12/2017 1933   GLUCOSEU NEGATIVE 08/12/2017 1933   HGBUR SMALL (A) 08/12/2017 1933   BILIRUBINUR NEGATIVE 08/12/2017 1933   KETONESUR NEGATIVE 08/12/2017 1933   PROTEINUR NEGATIVE 08/12/2017 1933   NITRITE NEGATIVE 08/12/2017 1933   LEUKOCYTESUR NEGATIVE 08/12/2017 1933   Sepsis Labs: @LABRCNTIP (procalcitonin:4,lacticidven:4)  )No results found for this or any previous visit (from the past 240 hour(s)).       Radiology Studies: Ct Head Wo Contrast  Result Date: 08/12/2017 CLINICAL DATA:  Altered level of consciousness EXAM: CT HEAD WITHOUT CONTRAST TECHNIQUE: Contiguous axial images were obtained from the base of the skull through the vertex without intravenous contrast. COMPARISON:  CT 08/05/2017, MRI 08/06/2017 FINDINGS: BRAIN: There is sulcal and ventricular prominence consistent with superficial and central atrophy. No intraparenchymal hemorrhage, mass effect nor midline shift. Periventricular and subcortical white matter hypodensities consistent with chronic small vessel ischemic disease are identified. No acute large vascular territory infarcts. No abnormal extra-axial fluid collections. Basal cisterns and fourth ventricle are not effaced and midline. VASCULAR: Moderate calcific atherosclerosis of the carotid siphons. SKULL:  No skull fracture. No significant scalp soft tissue swelling. SINUSES/ORBITS: The mastoid air-cells are clear. The included paranasal sinuses are well-aerated.The included ocular globes and orbital contents are non-suspicious. OTHER: None. 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 No acute intracranial abnormality. Cerebral atrophy. Electronically Signed   By: Ashley Royalty M.D.   On: 08/12/2017 19:31        Scheduled Meds: . apixaban  5 mg Oral BID  . folic acid  1 mg Oral Daily  . insulin aspart  0-9 Units Subcutaneous TID WC  . levothyroxine  75 mcg Oral QAC  breakfast  . metoprolol tartrate  25 mg Oral BID  . pantoprazole  40 mg Oral Daily  . rosuvastatin  10 mg Oral Daily  . thiamine injection  100 mg Intravenous Daily  . vitamin B-12  1,000 mcg Oral Daily  . zolpidem  5 mg Oral Once   Continuous Infusions:   LOS: 1 day    Time spent: 51mn    PDomenic Polite MD [facility] Page via [email or web address], password [email or web address] After 7PM please contact night-coverage  08/13/2017, 2:42 PM

## 2017-08-14 ENCOUNTER — Inpatient Hospital Stay (HOSPITAL_COMMUNITY): Payer: Medicare Other

## 2017-08-14 LAB — GLUCOSE, CAPILLARY
GLUCOSE-CAPILLARY: 138 mg/dL — AB (ref 65–99)
Glucose-Capillary: 102 mg/dL — ABNORMAL HIGH (ref 65–99)
Glucose-Capillary: 206 mg/dL — ABNORMAL HIGH (ref 65–99)
Glucose-Capillary: 124 mg/dL — ABNORMAL HIGH (ref 65–99)

## 2017-08-14 LAB — BASIC METABOLIC PANEL
ANION GAP: 10 (ref 5–15)
Anion gap: 7 (ref 5–15)
BUN: 10 mg/dL (ref 6–20)
BUN: 16 mg/dL (ref 6–20)
CALCIUM: 8.5 mg/dL — AB (ref 8.9–10.3)
CHLORIDE: 104 mmol/L (ref 101–111)
CO2: 20 mmol/L — ABNORMAL LOW (ref 22–32)
CO2: 20 mmol/L — ABNORMAL LOW (ref 22–32)
CREATININE: 1.1 mg/dL (ref 0.61–1.24)
Calcium: 8.3 mg/dL — ABNORMAL LOW (ref 8.9–10.3)
Chloride: 97 mmol/L — ABNORMAL LOW (ref 101–111)
Creatinine, Ser: 0.9 mg/dL (ref 0.61–1.24)
GFR calc non Af Amer: >60 mL/min (ref >60)
GLUCOSE: 122 mg/dL — AB (ref 65–99)
Glucose, Bld: 124 mg/dL — ABNORMAL HIGH (ref 65–99)
Potassium: 3.6 mmol/L (ref 3.5–5.1)
Potassium: 3.4 mmol/L — ABNORMAL LOW (ref 3.5–5.1)
Sodium: 127 mmol/L — ABNORMAL LOW (ref 135–145)
Sodium: 131 mmol/L — ABNORMAL LOW (ref 135–145)

## 2017-08-14 LAB — CBC
HCT: 40.8 % (ref 39.0–52.0)
HEMOGLOBIN: 14.1 g/dL (ref 13.0–17.0)
MCH: 33.3 pg (ref 26.0–34.0)
MCHC: 34.6 g/dL (ref 30.0–36.0)
MCV: 96.5 fL (ref 78.0–100.0)
Platelets: 183 10*3/uL (ref 150–400)
RBC: 4.23 MIL/uL (ref 4.22–5.81)
RDW: 14.2 % (ref 11.5–15.5)
WBC: 8.6 10*3/uL (ref 4.0–10.5)

## 2017-08-14 LAB — URINALYSIS, ROUTINE W REFLEX MICROSCOPIC
BILIRUBIN URINE: NEGATIVE
Glucose, UA: NEGATIVE mg/dL
Ketones, ur: NEGATIVE mg/dL
Leukocytes, UA: NEGATIVE
Nitrite: NEGATIVE
Protein, ur: 30 mg/dL — AB
SPECIFIC GRAVITY, URINE: 1.019 (ref 1.005–1.030)
pH: 6 (ref 5.0–8.0)

## 2017-08-14 MED ORDER — THIAMINE HCL 100 MG/ML IJ SOLN
500.0000 mg | Freq: Three times a day (TID) | INTRAVENOUS | Status: DC
  Administered 2017-08-14 – 2017-08-17 (×11): 500 mg via INTRAVENOUS
  Filled 2017-08-14 (×12): qty 5

## 2017-08-14 MED ORDER — IBUPROFEN 200 MG PO TABS
400.0000 mg | ORAL_TABLET | Freq: Once | ORAL | Status: AC
  Administered 2017-08-14: 400 mg via ORAL
  Filled 2017-08-14: qty 2

## 2017-08-14 MED ORDER — PIPERACILLIN-TAZOBACTAM 3.375 G IVPB
3.3750 g | Freq: Three times a day (TID) | INTRAVENOUS | Status: DC
  Administered 2017-08-14 – 2017-08-16 (×6): 3.375 g via INTRAVENOUS
  Filled 2017-08-14 (×8): qty 50

## 2017-08-14 MED ORDER — SODIUM CHLORIDE 0.9 % IV SOLN
INTRAVENOUS | Status: AC
  Administered 2017-08-14: 19:00:00 via INTRAVENOUS

## 2017-08-14 MED ORDER — SODIUM CHLORIDE 0.9 % IV BOLUS
500.0000 mL | Freq: Once | INTRAVENOUS | Status: AC
  Administered 2017-08-14: 500 mL via INTRAVENOUS

## 2017-08-14 NOTE — Progress Notes (Signed)
Pharmacy Antibiotic Note  [name] is a 77 y.o. male admitted on 08/12/2017 with falls, unable to get up from bed and from floor after falls and recent confusion.  He has h/o rheumatoid arthritis, lumbosacral spondylosis, osteoarthritis of both knees, H/o epidural spinal injection on 3/4 and Knee injection 07/22/17. Since going home 3/17 his balance has worsened, has fallen numerous times, unable to get up from floor numerous times, some periodic confusion overnight. Febrile this morning to 102.2 F.  Blood cultures, CXR ordered. Pharmacy has been consulted for Zosyn dosing for UTI vs aspiration pneumonia. SCr wnl at 0.9 and estimated CrCl is 65 ml/min.   Plan: Zosyn 3.375 gm IV q8h  (extended 4 hour infusion) start now. Monitor clinical progress, renal function, culture results.   Height: 5' 7"  (170.2 cm) Weight: 153 lb (69.4 kg) IBW/kg (Calculated) : 66.1  Temp (24hrs), Avg:99.4 F (37.4 C), Min:98.3 F (36.8 C), Max:102.2 F (39 C)  Recent Labs  Lab 08/12/17 1801 08/13/17 0502 08/13/17 1734 08/14/17 0447  WBC 9.8 9.9  --  8.6  CREATININE 1.13 0.88 0.84 0.90    Estimated Creatinine Clearance: 65.3 mL/min (by C-G formula based on SCr of 0.9 mg/dL).    Allergies  Allergen Reactions  . Hydrocodone Nausea Only    Antimicrobials this admission: Zosyn 3/25>>  Dose adjustments this admission:  Microbiology results: 3/25 BCx: sent 3/25 UCx: sent  Thank you for allowing pharmacy to be a part of this patient's care. Nicole Cella, RPh Clinical Pharmacist Pager: [phone] 8A-4P [phone]-10P [phone] [place] [phone] 08/14/2017 11:56 AM

## 2017-08-14 NOTE — Evaluation (Signed)
Occupational Therapy Evaluation Patient Details Name: [name] MRN: [id number] DOB: [birth year] Today's Date: 08/14/2017    History of Present Illness This 85 male admitted on 08/12/17 with falls, and confusion, and febrile.  CT of head shows no acute intracranial abnormality.  MRI of head and spine pending.  Work up ongoing, but initial Dx: AMS with likely undiagnosed dementia, possibly metabolic from acute hyponatremia vs. wernicke's encephalopathy with ataxia.  PMH includes:  ETOH abuse, A-Fib, h/o RA, hypothyroidism, lumbosacral spondylosis with recent epidural spinal injection on 3/4, OA of both knees  with knee injection on 3/2.     Clinical Impression   Pt admitted with above. He demonstrates the below listed deficits and will benefit from continued OT to maximize safety and independence with BADLs.  Pt presents to OT with generalized weakness, decreased balance, decreased activity tolerance, as well as significant cognitive deficits including deficits with orientation, attention, problem solving, organization and sequencing, awareness.    He currently requires mod - total A for all ADLs and mod A for functional transfers.   PTA, he lived with wife, and was fully independent with ADLs, mobility, driving, and community activities prior to 3/17.  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  He will definitely need post acute rehab at discharge.  Will follow acutely.       Follow Up Recommendations  CIR;Supervision/Assistance - 24 hour    Equipment Recommendations  3 in 1 bedside commode;Tub/shower seat;Wheelchair (measurements OT)    Recommendations for Other Services Rehab consult     Precautions / Restrictions Precautions Precautions: Fall      Mobility Bed Mobility Overal bed mobility: Needs Assistance Bed Mobility: Supine to Sit     Supine to sit: Mod assist     General bed mobility comments: mod A to lift  trunk and to scoot hips to EOB.  Requires max cues for sequencing and problem solving   Transfers Overall transfer level: Needs assistance Equipment used: Rolling walker (2 wheeled) Transfers: Sit to/from [facility] Sit to Stand: Mod assist Stand pivot transfers: Mod assist       General transfer comment: max verbal cues for sequencing and mod A for balance and to assist with weight shift     Balance Overall balance assessment: Needs assistance Sitting-balance support: Feet supported Sitting balance-Leahy Scale: Poor Sitting balance - Comments: loses balance posteriorly  Postural control: Posterior lean Standing balance support: Single extremity supported Standing balance-Leahy Scale: Poor Standing balance comment: requires mod A                            ADL either performed or assessed with clinical judgement   ADL Overall ADL's : Needs assistance/impaired Eating/Feeding: Minimal assistance;Bed level   Grooming: Wash/dry hands;Wash/dry face;Brushing hair;Moderate assistance;Sitting   Upper Body Bathing: Maximal assistance;Sitting   Lower Body Bathing: Maximal assistance;Sit to/from stand   Upper Body Dressing : Maximal assistance;Sitting   Lower Body Dressing: Total assistance;Sit to/from stand Lower Body Dressing Details (indicate cue type and reason): Pt unable to access feet to don socks.  Unable to problem solve through possible solutions or to conclude that he was unable to perform task.  He just sat wiggling his feet while holding his socks.   Toilet Transfer: Moderate assistance;Stand-pivot;BSC Toilet Transfer Details (indicate cue type and reason): max verbal cues for sequencing and mod A for balance and to  assist with weight shift  Toileting- Clothing Manipulation and Hygiene: Maximal assistance;Sit to/from stand       Functional mobility during ADLs: Moderate assistance       Vision   Additional Comments: Pt unable to  participate in visual assessment due to severity of cognitive deficits      Perception     [place] tested?: Deficits Deficits: Organization;Initiation;Ideomotor    Pertinent Vitals/Pain Pain Assessment: No/denies pain Faces Pain Scale: No hurt     Hand Dominance Right   Extremity/Trunk Assessment Upper Extremity Assessment Upper Extremity Assessment: Generalized weakness   Lower Extremity Assessment Lower Extremity Assessment: Defer to PT evaluation   Cervical / Trunk Assessment Cervical / Trunk Assessment: Other exceptions(trunk instability noted )   Communication Communication Communication: Other (comment)(minimal verbalization )   Cognition Arousal/Alertness: Awake/alert Behavior During Therapy: Restless;Impulsive Overall Cognitive Status: Impaired/Different from baseline Area of Impairment: Orientation;Attention;Memory;Following commands;Safety/judgement;Problem solving;Awareness                 Orientation Level: Disoriented to;Place;Situation;Time Current Attention Level: Sustained;Focused Memory: Decreased short-term memory Following Commands: Follows one step commands consistently;Follows one step commands with increased time(and mod cues ) Safety/Judgement: Decreased awareness of safety;Decreased awareness of deficits   Problem Solving: Slow processing;Decreased initiation;Difficulty sequencing;Requires verbal cues;Requires tactile cues General Comments: Pt was working as a Psychologist, sport and exercise until the end of 2018.   He currently, requires up to mod cues to follow simple commands.  He requires max A to problem solve errors during basic activities.  He is easily distracted    General Comments  wife arrived at end of session.  Updated her on pt performance and recommendation for post acute rehab     Exercises     Shoulder Instructions      Home Living Family/patient expects to be discharged to:: Private residence Living Arrangements:  Spouse/significant other Available Help at Discharge: Family;Friend(s);Available PRN/intermittently;Available 24 hours/day Type of Home: House Home Access: Stairs to enter [employer] of Steps: 2-3   Home Layout: Two level;Able to live on main level with bedroom/bathroom Alternate Level Stairs-Number of Steps: full flight    Bathroom Shower/Tub: Walk-in shower   Bathroom Toilet: Handicapped height     Home Equipment: Environmental consultant - 2 wheels   Additional Comments: Pt lives with wife.  Sons are able to assist some.  They have moved bed from upstairs bedroom onto the main level       Prior Functioning/Environment Level of Independence: Independent        Comments: Pt was fully independent including community activities, and driving up until ~2/50 at which time he started to have falls and confusion.  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         OT Problem List: Decreased strength;Decreased activity tolerance;Impaired balance (sitting and/or standing);Decreased cognition;Decreased safety awareness;Decreased knowledge of use of DME or AE      OT Treatment/Interventions: Self-care/ADL training;Neuromuscular education;DME and/or AE instruction;Therapeutic activities;Cognitive remediation/compensation;Patient/family education;Balance training    OT Goals(Current goals can be found in the care plan section) Acute Rehab OT Goals Patient Stated Goal: Wife hopes pt will get back to baseline  OT Goal Formulation: With patient/family Time For Goal Achievement: 08/28/17 Potential to Achieve Goals: Good ADL Goals Pt Will Perform Grooming: with supervision;sitting Pt Will Perform Upper Body Bathing: with mod assist;sitting Pt Will Perform Lower Body Bathing: with mod assist;sit to/from stand Pt Will Transfer to Toilet: with mod assist;stand pivot transfer;bedside commode Pt Will  Perform Toileting - Clothing Manipulation and hygiene: with mod  assist;sit to/from stand Additional ADL Goal #1: Pt will sustain attention to familiar ADL tasks for up to 3 mins with min cues Additional ADL Goal #2: Pt will be oriented to time and place with min cues and use of external aids  OT Frequency: Min 2X/week   Barriers to D/C: Decreased caregiver support  unsure that family will be able to provide necessary level of assist at discharge        Co-evaluation              AM-PAC PT "6 Clicks" Daily Activity     Outcome Measure Help from another person eating meals?: A Little Help from another person taking care of personal grooming?: A Lot Help from another person toileting, which includes using toliet, bedpan, or urinal?: A Lot Help from another person bathing (including washing, rinsing, drying)?: A Lot Help from another person to put on and taking off regular upper body clothing?: A Lot Help from another person to put on and taking off regular lower body clothing?: Total 6 Click Score: 12   End of Session Equipment Utilized During Treatment: Gait belt Nurse Communication: Mobility status  Activity Tolerance: Other (comment)(limited by cognitive deficit ) Patient left: in bed;with call bell/phone within reach;with chair alarm set;with family/visitor present  OT Visit Diagnosis: Unsteadiness on feet (R26.81);Cognitive communication deficit (R41.841)                Time: 8101-7510 OT Time Calculation (min): 26 min Charges:  OT General Charges $OT Visit: 1 Visit OT Evaluation $OT Eval Moderate Complexity: 1 Mod OT Treatments $Therapeutic Activity: 8-22 mins G-Codes:     [facility], OTR/L [phone]   Lucille Passy M 08/14/2017, 3:04 PM

## 2017-08-14 NOTE — Progress Notes (Addendum)
Reason for consult:   Subjective: Febrile this morning to 102.2. Blood cultures, CXR ordered. Sleepy this morning from thorazine overnight.   ROS: Unable to obtain due to poor mental status  Examination  Vital signs in last 24 hours: Temp:  [98.3 F (36.8 C)-102.2 F (39 C)] 99.5 F (37.5 C) (03/25 0900) Pulse Rate:  [80-102] 102 (03/25 0750) Resp:  [17-18] 18 (03/25 0750) BP: (93-122)/(49-91) 117/91 (03/25 0750) SpO2:  [98 %-99 %] 99 % (03/25 0750)  General: Not in distress, cooperative CVS: pulse-normal rate and rhythm RS: breathing comfortably Extremities: normal, no edema  Neuro: MS: Sleeping but awakens easily to voice, follows commands CN: pupils equal and reactive,  EOMI, face symmetric, tongue midline, normal sensation over face Motor: Exam limited due to poor patient cooperation but strength appears symmetric but decreased bilaterally, no focal deficits Coordination: Unable to assess Sensory: unable to assess accurately  Gait: not tested  Basic Metabolic Panel: Recent Labs  Lab 08/12/17 1801 08/13/17 0502 08/13/17 1734 08/14/17 0447  NA 126* 127* 125* 127*  K 4.2 4.1 3.5 3.6  CL 98* 97* 95* 97*  CO2 19* 18* 21* 20*  GLUCOSE 131* 123* 121* 122*  BUN 14 9 8 10   CREATININE 1.13 0.88 0.84 0.90  CALCIUM 8.9 8.5* 8.5* 8.5*    CBC: Recent Labs  Lab 08/12/17 1801 08/13/17 0502 08/14/17 0447  WBC 9.8 9.9 8.6  NEUTROABS 4.9  --   --   HGB 13.8 13.0 14.1  HCT 39.2 37.7* 40.8  MCV 96.8 95.9 96.5  PLT 180 185 183     Coagulation Studies: No results for input(s): LABPROT, INR in the last 72 hours.  Imaging Reviewed:   3/23 CT-scan of the brain IMPRESSION: Chronic microangiopathic ischemic change of periventricular subcortical white matter. No acute intracranial abnormality. Cerebral atrophy.  3/17 CT-scan of the brain IMPRESSION: 1. No evidence of acute intracranial abnormality. No evidence of calvarial fracture. 2. Generalized cerebral  volume loss and mild chronic small vessel ischemic changes in the cerebral white matter.  Repeat MRI examination of the brain & lumbar & thoracic spine - pending  3/17 MRI Brain & Lumbar Spine IMPRESSION: Chronic ischemic microangiopathy without acute intracranial Abnormality.   ASSESSMENT:  [name] is a 77 y.o. male with pmhx of chronic alcohol abuse, AFIB on Eliquis, DM, Hypothyroidism, RA, Hx of lumbosacral spondylosis, osteoarthritis of both knees, Hx of recent Epidural spinal injection on 3/4 and Knee injection on 07/22/2017. Patient admitted for worsening generalized weakness, ataxic gait and worsening confusion.  IMPRESSION:  Metabolic Encephalopathy likely mutlifactorial- acute hyponatremia, possible alcohol withdrawal, possible underlying dementia, Wernicke's encephalopathy ( less likely)  Sensory ataxia  PLAN: -- Increase IV thiamine to 500 mg TID -- Acute hyponatremia work up consistent with SIADH. Fluid restrict, management per primary -- Infectious work up per primary  -- F/u repeat MRI brain & lumbar spine  -- PT / OT -- Will follow clinically   Velna Ochs, M.D. - PGY2 08/14/2017, 11:29 AM    NEUROHOSPITALIST ADDENDUM Seen and examined the patient today. Formulated plan as documented above by PAC/Resident. I agree with recommendations as above.   Seen and examined the patient today. The patient was admitted for subacute weakness worsening confusion for the last week.  Today, the patient was somnolent and very confused Difficult to perform reliable neurological exam. He does appear weaker in both legs but unclear if this is from not being cooperative due to his impaired menta status. [name]  was concerned  for sensory ataxia being responsible for his impaired gait. His b12 was wnl. We will first aim at treating for nutritional deficiencies given his history of daily alcohol consumption. 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    Russell Addison Kharson Rasmusson MD [facility] [phone]  If 7pm to 7am, please call on call as listed on AMION.

## 2017-08-14 NOTE — Progress Notes (Signed)
Inpatient Rehabilitation  Per PT request, patient was screened by Gunnar Fusi for appropriateness for an Inpatient Acute Rehab consult.  At this time we will follow along for a rehab appropriate diagnosis; hopeful that pending MRI will clarify diagnosis.  Call if questions.  Carmelia Roller., CCC/SLP Admission Coordinator  [place]  Cell [phone]

## 2017-08-14 NOTE — Progress Notes (Signed)
   08/14/17 1958  Vitals  Temp 97.7 F (36.5 C)  Temp Source Axillary  BP (!) 74/42  BP Location Right Arm  BP Method Automatic  Patient Position (if appropriate) Lying  Pulse Rate 73  Pulse Rate Source Dinamap  Resp 18  Oxygen Therapy  SpO2 99 %  O2 Device Room Air    NP on-call notified of pt's VS and new order received for bolus. Pt also educated on Incentive spirometer and it's importance. Pt pt voices understanding and able to demonstrate back to RN IS use; spouse remains at bedside and will continue to monitor. Delia Heady RN

## 2017-08-14 NOTE — Plan of Care (Signed)
MD  -  son Mohit phone number under Additional Info on Contact Info page.  Son and Spouse would like updates directly from the MD if possible via phone listed if not in room when MD visits/assesses.  Thank you in advance from the family.

## 2017-08-14 NOTE — Clinical Social Work Note (Signed)
Clinical Social Work Assessment  Patient Details  Name: [name] MRN: [id number] Date of Birth: 1940-08-12  Date of referral:  08/14/17               Reason for consult:  Facility Placement                Permission sought to share information with:  Facility Sport and exercise psychologist, Family Supports Permission granted to share information::  Yes, Verbal Permission Granted  Name::     [name], Development worker, international aid::  SNF  Relationship::  Wife, son  Contact Information:     Housing/Transportation Living arrangements for the past 2 months:  Single Family Home Source of Information:  Spouse Patient Interpreter Needed:  None Criminal Activity/Legal Involvement Pertinent to Current Situation/Hospitalization:  No - Comment as needed Significant Relationships:  Adult Children, Spouse Lives with:  Self, Spouse Do you feel safe going back to the place where you live?  Yes Need for family participation in patient care:  Yes (Comment)(patient not oriented)  Care giving concerns:  Patient lives at home with spouse but will need short term rehab at discharge.   Social Worker assessment / plan:  CSW met with patient's wife at bedside to discuss back-up to CIR placement. CSW reassured patient's wife that the team wants to keep him at the hospital as first choice, but that there are other options if that doesn't work out. CSW provided SNF list and obtained permission to work on back-up options in the meantime. CSW to continue to follow.  Employment status:  Retired Nurse, adult PT Recommendations:  Inpatient [place] / Referral to community resources:  [place]  Patient/Family's Response to care:  Patient's wife wants the patient to stay at the hospital for CIR, but is agreeable to SNF if needed.  Patient/Family's Understanding of and Emotional Response to Diagnosis, Current Treatment, and Prognosis:  Patient's wife discussed how the  first preference is to stay at the hospital and she doesn't want to do anything to jeopardize his chances of staying for CIR. Patient's wife was relieved when reassured that working on other options would not jeopardize chances. Patient's wife to review SNF list and discuss placement options if needed.  Emotional Assessment Appearance:  Appears stated age Attitude/Demeanor/Rapport:  Unable to Assess Affect (typically observed):  Unable to Assess Orientation:    Alcohol / Substance use:  Not Applicable Psych involvement (Current and /or in the community):  No (Comment)  Discharge Needs  Concerns to be addressed:  Care Coordination Readmission within the last 30 days:  No Current discharge risk:  Dependent with Mobility Barriers to Discharge:  Continued Medical Work up, Dalton, Melba 08/14/2017, 8:43 PM

## 2017-08-14 NOTE — Progress Notes (Signed)
Messaged Dr. Leonel Ramsay per LOC  Alert minimally when aroused lethargy  to voice and pain momentarily opens eyes, not remaining open, minimal focus on conversation, inability to form clear comprehensible words.  Wife at bedside.  Aspiration precautions, although observed to cough, fully not alert.

## 2017-08-14 NOTE — Progress Notes (Signed)
Pt noted to have less than 167m urine in foley bag since last emptied and after receiving 5028mbolus. Pt bladder scanned for 4858mpt complained of bladder pain whiles checking with the bladder scan. Reported off to oncoming RN to continue to monitor. P. Delia Heady

## 2017-08-14 NOTE — Progress Notes (Signed)
PROGRESS NOTE    [name]  JJK:[id number] DOB: 1940-06-09 DOA: 08/12/2017 PCP: Merrilee Seashore, MD  Brief Narrative:  [name] is a 77 y.o. 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 Since going home 3/17 his balance has worsened, has fallen numerous times, unable to get up from floor numerous times, and increased confusion.   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 also contributing -I also discussed with Neurosurgery Dr.Cabell 3/24, he reviewed images and recommended consideration of MRI T spine to eval for posterior cord/dorsal column lesions -was unable to do MRI 3/24 -PT consulting, definitely needs Rehab,  -We hope CIR is an option -B12 -688, TSH is ok, t4 is not of value while on synthroid replacement -continue PT/OT to prevent ongoing deconditioning  Hyponatremia -labs Urine Na 68 and 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 -started Fluid restriction and given low dose Lasix 96m Po x1 yesterday -NA 127 this am, monitor closely  but due to concomitant infection may need to start low rate IVF if Po intake is poor and BP soft -recheck Na this pm and tomorrow  Fever -temp 102 this am with CXR concerning for LLL pneumonia -most likely aspiration related, productive cough this am and ronchi in both lungs L>R -started IV Zosyn, monitor clinically -start IVF if po intake poor and BP soft -UA not suggestive of infection, no can leave foley in for now  Urinary retention -likely due to BPH, was able to void 100cc in bathroom when he walked with PT yesterday -per wife long H/o nocturia, uses bathroom 2-3times every night, never seen a Urologist -foley placed due to recurrent retention 3/24 am -voiding trial when more ambulatory -hold off on FLomax at this time due to soft BP and risk of Orthostatic hypotension in elderly, if BP improves will consider  Acute Delirium -multifactorial, hyponatremia, poor sleep, hospital induced delirium, and 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 And now infection -monitor and Rx Na, Ativan very low dose as needed -avoid scheduled benzo   P.Afib -in NSR,  continue BB and eliquis -looks like flecainide was discontinued  HTN -on ARB and metoprolol, will continue metoprolol and hold ARB with soft BPs  Hypothyroidism -continue synthroid, recent TSH ok  DM type 2 -hold metformin, SSI  DVT prophylaxis: on eliquis Code Status: Full COde  Family Communication: Wife [name] at bedside Disposition Plan: Hopefully inpatient rehab-CIR     Consultants:   NEuro  D/w NSG Dr.Cabel   Procedures:   Antimicrobials:    Subjective: -febrile this am, cough/clearing throat  Objective: Vitals:   08/14/17 0750 08/14/17 0900 08/14/17 1100 08/14/17 1542  BP: (!) 117/91  (Marland Kitchen  92/47 123/83  Pulse: (!) 102  76 (!) 101  Resp: 18  18 18   Temp: (!) 102.2 F (39 C) 99.5 F (37.5 C) 99.5 F (37.5 C) (!) 102.7 F (39.3 C)  TempSrc: Axillary Axillary Axillary Oral  SpO2: 99%   96% 96%  Weight:      Height:        Intake/Output Summary (Last 24 hours) at 08/14/2017 1610 Last data filed at 08/14/2017 0053 Gross per 24 hour  Intake 90 ml  Output 300 ml  Net -210 ml   Filed Weights   08/12/17 1635 08/12/17 2213  Weight: 71.7 kg (158 lb) 69.4 kg (153 lb)    Examination:  Gen: somnolent, arousable, answers some questions appropriately, otherwise confused HEENT: PERRLA, Lungs: few scattered ronchi esp on left CVS: S1S2/RRR tachy Abd: soft, Non tender, non distended, BS present Extremities: No Cyanosis, Clubbing or edema Skin: scabs on his knees Neuro: some confusion, mild lethargy, cranial nerves intact, Motor 5/5, sensations ? Decreased but confusion limits interpretation -DTR 2plus, confused about proprioception Psychiatry: pleasant but confused   Data Reviewed:   CBC: Recent Labs  Lab 08/12/17 1801 08/13/17 0502 08/14/17 0447  WBC 9.8 9.9 8.6  NEUTROABS 4.9  --   --   HGB 13.8 13.0 14.1  HCT 39.2 37.7* 40.8  MCV 96.8 95.9 96.5  PLT 180 185 673   Basic Metabolic Panel: Recent Labs  Lab 08/12/17 1801 08/13/17 0502 08/13/17 1734 08/14/17 0447  NA 126* 127* 125* 127*  K 4.2 4.1 3.5 3.6  CL 98* 97* 95* 97*  CO2 19* 18* 21* 20*  GLUCOSE 131* 123* 121* 122*  BUN 14 9 8 10   CREATININE 1.13 0.88 0.84 0.90  CALCIUM 8.9 8.5* 8.5* 8.5*   GFR: Estimated Creatinine Clearance: 65.3 mL/min (by C-G formula based on SCr of 0.9 mg/dL). Liver Function Tests: Recent Labs  Lab 08/12/17 1801  AST 45*  ALT 27  ALKPHOS 66  BILITOT 0.6  PROT 6.3*  ALBUMIN 3.1*   No results for input(s): LIPASE, AMYLASE in the last 168 hours. Recent Labs  Lab 08/13/17 1734  AMMONIA 13   Coagulation Profile: No results for input(s): INR, PROTIME in the last 168 hours. Cardiac Enzymes: No results for input(s): CKTOTAL, CKMB, CKMBINDEX, TROPONINI in the last 168 hours. BNP (last 3 results) No results for input(s): PROBNP in the last 8760  hours. HbA1C: Recent Labs    08/13/17 0815  HGBA1C 6.2*   CBG: Recent Labs  Lab 08/13/17 1150 08/13/17 1610 08/13/17 2130 08/14/17 0615 08/14/17 1131  GLUCAP 208* 117* 170* 102* 138*   Lipid Profile: No results for input(s): CHOL, HDL, LDLCALC, TRIG, CHOLHDL, LDLDIRECT in the last 72 hours. Thyroid Function Tests: Recent Labs    08/13/17 0859  TSH 2.228  FREET4 1.89*   Anemia Panel: Recent Labs    08/13/17 0502  VITAMINB12 688   Urine analysis:    Component Value Date/Time   COLORURINE AMBER (A) 08/14/2017 1154   APPEARANCEUR CLEAR 08/14/2017 1154   LABSPEC 1.019 08/14/2017 1154   PHURINE 6.0 08/14/2017 1154   GLUCOSEU NEGATIVE 08/14/2017 1154   HGBUR LARGE (A) 08/14/2017 1154   BILIRUBINUR NEGATIVE 08/14/2017 1154   KETONESUR NEGATIVE 08/14/2017 1154   PROTEINUR 30 (A) 08/14/2017 1154   NITRITE NEGATIVE 08/14/2017 1154   LEUKOCYTESUR NEGATIVE 08/14/2017 1154   Sepsis Labs: @LABRCNTIP (procalcitonin:4,lacticidven:4)  )No results found for this or any previous visit (from the past 240 hour(s)).  Radiology Studies: Dg Chest 2 View  Result Date: 08/13/2017 CLINICAL DATA:  SIADH EXAM: CHEST - 2 VIEW COMPARISON:  01/16/2016 FINDINGS: The heart size and mediastinal contours are within normal limits. Both lungs are clear. The visualized skeletal structures are unremarkable. IMPRESSION: No active cardiopulmonary disease. Electronically Signed   By: Inez Catalina M.D.   On: 08/13/2017 17:55   Ct Head Wo Contrast  Result Date: 08/12/2017 CLINICAL DATA:  Altered level of consciousness EXAM: CT HEAD WITHOUT CONTRAST TECHNIQUE: Contiguous axial images were obtained from the base of the skull through the vertex without intravenous contrast. COMPARISON:  CT 08/05/2017, MRI 08/06/2017 FINDINGS: BRAIN: There is sulcal and ventricular prominence consistent with superficial and central atrophy. No intraparenchymal hemorrhage, mass effect nor midline shift.  Periventricular and subcortical white matter hypodensities consistent with chronic small vessel ischemic disease are identified. No acute large vascular territory infarcts. No abnormal extra-axial fluid collections. Basal cisterns and fourth ventricle are not effaced and midline. VASCULAR: Moderate calcific atherosclerosis of the carotid siphons. SKULL: No skull fracture. No significant scalp soft tissue swelling. SINUSES/ORBITS: The mastoid air-cells are clear. The included paranasal sinuses are well-aerated.The included ocular globes and orbital contents are non-suspicious. OTHER: None. 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 No acute intracranial abnormality. Cerebral atrophy. Electronically Signed   By: Ashley Royalty M.D.   On: 08/12/2017 19:31   Dg Chest Port 1 View  Result Date: 08/14/2017 CLINICAL DATA:  Fever EXAM: PORTABLE CHEST 1 VIEW COMPARISON:  PA and lateral chest x-ray of August 13, 2017 FINDINGS: There is new hazy increased density at the left lung base. There is partial obscuration of the left hemidiaphragm. There is a small left pleural effusion. The right lung is clear. The heart is top-normal to mildly enlarged. The pulmonary vascularity is normal. There is calcification in the wall of the aortic arch. The bony thorax exhibits no acute abnormality. IMPRESSION: Left lower lobe pneumonia and small left pleural effusion. Followup PA and lateral chest X-ray is recommended in 3-4 weeks following trial of antibiotic therapy to ensure resolution and exclude underlying malignancy. Thoracic aortic atherosclerosis. Electronically Signed   By: David  Martinique M.D.   On: 08/14/2017 13:15        Scheduled Meds: . apixaban  5 mg Oral BID  . folic acid  1 mg Oral Daily  . insulin aspart  0-9 Units Subcutaneous TID WC  . levothyroxine  75 mcg Oral QAC breakfast  . metoprolol tartrate  25 mg Oral BID  . pantoprazole  40 mg Oral Daily  .  rosuvastatin  10 mg Oral Daily  . sodium chloride  1 g Oral BID WC  . vitamin B-12  1,000 mcg Oral Daily   Continuous Infusions: . piperacillin-tazobactam (ZOSYN)  IV 3.375 g (08/14/17 1314)  . thiamine injection 0 mg (08/14/17 1108)     LOS: 2 days    Time spent: 79mn    PDomenic Polite MD [facility] Page via [email or web address], password [email or web address] After 7PM please contact night-coverage  08/14/2017, 4:10 PM

## 2017-08-14 NOTE — NC FL2 (Signed)
[place] LEVEL OF CARE SCREENING TOOL     IDENTIFICATION  Patient Name: [name] Birthdate: 12/18/1940 Sex: male Admission Date (Current Location): 08/12/2017  [facility] [place] and [place] Number:  Herbalist and Address:  [facility], [place] [address] [place], [place] [address]      Provider Number: [id number]  Attending Physician Name and Address:  Domenic Polite, MD  Relative Name and Phone Number:       Current Level of Care: Hospital Recommended Level of Care: [place] Prior Approval Number:    Date Approved/Denied:   PASRR Number: [id number]  Discharge Plan: SNF    Current Diagnoses: Patient Active Problem List   Diagnosis Date Noted  . Spinal stenosis 08/12/2017  . 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 Type 2 diabetes mellitus with other specified complication ([place]) 71/69/6789  . Hyponatremia 08/12/2017  . Atrial fibrillation ([place]) 01/16/2016  . Rheumatoid arthritis ([place]) 01/16/2016  . History of artificial joint 12/19/2014  . H/O knee surgery 12/19/2014  . Primary osteoarthritis of both knees 10/27/2014  . Osteopenia 03/31/2014  . HEARTBURN 09/04/2009    Orientation RESPIRATION BLADDER Height & Weight     (not oriented)  Normal External catheter(catheter placed 08/13/17) Weight: 153 lb (69.4 kg) Height:  5' 7"  (170.2 cm)  BEHAVIORAL SYMPTOMS/MOOD NEUROLOGICAL BOWEL NUTRITION STATUS      Incontinent Diet(carb modified)  AMBULATORY STATUS COMMUNICATION OF NEEDS Skin   Extensive Assist Verbally Normal                       Personal Care Assistance Level of Assistance  Bathing, Feeding, Dressing Bathing Assistance: Limited assistance Feeding assistance: Limited assistance Dressing Assistance: Limited assistance     Functional Limitations Info  Sight, Hearing, Speech Sight Info: Adequate Hearing Info: Adequate Speech Info: Impaired(incomprehensible speech)    SPECIAL CARE FACTORS  FREQUENCY  PT (By licensed PT), OT (By licensed OT), Speech therapy     PT Frequency: 5x/wk OT Frequency: 5x/wk     Speech Therapy Frequency: 5x/wk      Contractures Contractures Info: Not present    Additional Factors Info  Code Status, Allergies, Insulin Sliding Scale Code Status Info: Full Allergies Info: Hydrocodone   Insulin Sliding Scale Info: 0-9 units 3x/day with meals       Current Medications (08/14/2017):  This is the current hospital active medication list Current Facility-Administered Medications  Medication Dose Route Frequency Provider Last Rate Last Dose  . acetaminophen (TYLENOL) tablet 650 mg  650 mg Oral Q6H PRN Domenic Polite, MD       Or  . acetaminophen (TYLENOL) suppository 650 mg  650 mg Rectal Q6H PRN Domenic Polite, MD   650 mg at 08/14/17 1601  . apixaban (ELIQUIS) tablet 5 mg  5 mg Oral BID Domenic Polite, MD   5 mg at 08/14/17 1033  . diclofenac sodium (VOLTAREN) 1 % transdermal gel 4 g  4 g Topical QID PRN Domenic Polite, MD      . folic acid (FOLVITE) tablet 1 mg  1 mg Oral Daily Domenic Polite, MD   1 mg at 08/14/17 1033  . insulin aspart (novoLOG) injection 0-9 Units  0-9 Units Subcutaneous TID WC Domenic Polite, MD   1 Units at 08/14/17 1225  . levothyroxine (SYNTHROID, LEVOTHROID) tablet 75 mcg  75 mcg Oral QAC breakfast Domenic Polite, MD   75 mcg at 08/14/17 1032  . LORazepam (ATIVAN) injection 0.5 mg  0.5 mg Intravenous Q6H PRN Domenic Polite, MD   0.5 mg at 08/13/17 2102  . metoprolol tartrate (LOPRESSOR) tablet 25 mg  25 mg Oral BID Domenic Polite, MD   25 mg at 08/14/17 1032  . ondansetron (ZOFRAN) tablet 4 mg  4 mg Oral Q6H PRN Domenic Polite, MD       Or  . ondansetron [facility]) injection 4 mg  4 mg Intravenous Q6H PRN Domenic Polite, MD      . pantoprazole (PROTONIX) EC tablet 40 mg  40 mg Oral Daily Domenic Polite, MD   40 mg at 08/14/17 1033  . piperacillin-tazobactam (ZOSYN) IVPB 3.375 g  3.375 g Intravenous Marlou Starks, MD 12.5 mL/hr at 08/14/17 1314 3.375 g at 08/14/17 1314  . rosuvastatin (CRESTOR) tablet 10 mg  10 mg Oral Daily Domenic Polite, MD   10 mg at 08/14/17 1033  . sodium chloride tablet 1 g  1 g Oral BID WC Domenic Polite, MD   1 g at 08/14/17 1107  . thiamine 595m in normal saline (591m IVPB  500 mg Intravenous TID GuVelna OchsMD 0 mL/hr at 08/14/17 1108 500 mg at 08/14/17 1601  . vitamin B-12 (CYANOCOBALAMIN) tablet 1,000 mcg  1,000 mcg Oral Daily CoCandise Che, NP   1,000 mcg at 08/14/17 1032     Discharge Medications: Please see discharge summary for a list of discharge medications.  Relevant Imaging Results:  Relevant Lab Results:   Additional Information SS#: [id number] OchsLCSW

## 2017-08-15 ENCOUNTER — Encounter (HOSPITAL_COMMUNITY): Payer: Self-pay | Admitting: General Practice

## 2017-08-15 ENCOUNTER — Inpatient Hospital Stay (HOSPITAL_COMMUNITY): Payer: Medicare Other

## 2017-08-15 LAB — GLUCOSE, CAPILLARY
GLUCOSE-CAPILLARY: 97 mg/dL (ref 65–99)
Glucose-Capillary: 109 mg/dL — ABNORMAL HIGH (ref 65–99)
Glucose-Capillary: 149 mg/dL — ABNORMAL HIGH (ref 65–99)
Glucose-Capillary: 150 mg/dL — ABNORMAL HIGH (ref 65–99)

## 2017-08-15 LAB — RESPIRATORY PANEL BY PCR
Adenovirus: NOT DETECTED
BORDETELLA PERTUSSIS-RVPCR: NOT DETECTED
CHLAMYDOPHILA PNEUMONIAE-RVPPCR: NOT DETECTED
Coronavirus 229E: NOT DETECTED
Coronavirus HKU1: NOT DETECTED
Coronavirus NL63: NOT DETECTED
Coronavirus OC43: NOT DETECTED
INFLUENZA A-RVPPCR: NOT DETECTED
Influenza B: NOT DETECTED
MYCOPLASMA PNEUMONIAE-RVPPCR: NOT DETECTED
Metapneumovirus: NOT DETECTED
PARAINFLUENZA VIRUS 3-RVPPCR: NOT DETECTED
PARAINFLUENZA VIRUS 4-RVPPCR: NOT DETECTED
Parainfluenza Virus 1: NOT DETECTED
Parainfluenza Virus 2: NOT DETECTED
RHINOVIRUS / ENTEROVIRUS - RVPPCR: NOT DETECTED
Respiratory Syncytial Virus: NOT DETECTED

## 2017-08-15 LAB — CBC
HCT: 38 % — ABNORMAL LOW (ref 39.0–52.0)
HEMOGLOBIN: 13.1 g/dL (ref 13.0–17.0)
MCH: 32.6 pg (ref 26.0–34.0)
MCHC: 34.5 g/dL (ref 30.0–36.0)
MCV: 94.5 fL (ref 78.0–100.0)
Platelets: 189 10*3/uL (ref 150–400)
RBC: 4.02 MIL/uL — ABNORMAL LOW (ref 4.22–5.81)
RDW: 13.8 % (ref 11.5–15.5)
WBC: 10.9 10*3/uL — ABNORMAL HIGH (ref 4.0–10.5)

## 2017-08-15 LAB — BASIC METABOLIC PANEL
Anion gap: 6 (ref 5–15)
BUN: 12 mg/dL (ref 6–20)
CALCIUM: 8 mg/dL — AB (ref 8.9–10.3)
CHLORIDE: 105 mmol/L (ref 101–111)
CO2: 21 mmol/L — ABNORMAL LOW (ref 22–32)
CREATININE: 0.92 mg/dL (ref 0.61–1.24)
GFR calc Af Amer: >60 mL/min (ref >60)
GFR calc non Af Amer: >60 mL/min (ref >60)
Glucose, Bld: 112 mg/dL — ABNORMAL HIGH (ref 65–99)
Potassium: 3.7 mmol/L (ref 3.5–5.1)
Sodium: 132 mmol/L — ABNORMAL LOW (ref 135–145)

## 2017-08-15 LAB — URINE CULTURE: Culture: NO GROWTH

## 2017-08-15 LAB — FOLATE RBC
FOLATE, RBC: 749 ng/mL (ref >498)
Folate, Hemolysate: 265.8 ng/mL
HEMATOCRIT: 35.5 % — AB (ref 37.5–51.0)

## 2017-08-15 LAB — RPR: RPR Ser Ql: NONREACTIVE

## 2017-08-15 MED ORDER — FLECAINIDE ACETATE 100 MG PO TABS
100.0000 mg | ORAL_TABLET | Freq: Two times a day (BID) | ORAL | Status: DC
  Administered 2017-08-15: 100 mg via ORAL
  Filled 2017-08-15: qty 1

## 2017-08-15 MED ORDER — FLECAINIDE ACETATE 50 MG PO TABS
50.0000 mg | ORAL_TABLET | Freq: Two times a day (BID) | ORAL | Status: DC
  Administered 2017-08-15 – 2017-08-18 (×6): 50 mg via ORAL
  Filled 2017-08-15 (×7): qty 1

## 2017-08-15 MED ORDER — SODIUM CHLORIDE 0.9 % IV SOLN
INTRAVENOUS | Status: DC
  Administered 2017-08-15 – 2017-08-16 (×2): via INTRAVENOUS

## 2017-08-15 NOTE — Progress Notes (Signed)
[place] PHYSICAL MEDICINE AND REHABILITATION  CONSULT SERVICE NOTE   Consult received. Chart reviewed. Pt with cognitive and multi-factorial functional decline over months. May be ETOH related as well. MRI's of brain and L-spine are pending. Will follow up with full consult pending MRI results. At this point, I am unclear what role inpatient rehab would have in this situation, given its chronicity and, at this point, unclear etiology.   Meredith Staggers, MD, [place] 08/15/2017

## 2017-08-15 NOTE — Care Management Note (Signed)
Case Management Note  Patient Details  Name: [name] MRN: [id number] Date of Birth: 1940/11/21  Subjective/Objective:   Pt admitted with gait disorder. He is from home with his spouse.                Action/Plan: PT/OT recommending CIR. No sure patient will have diagnosis for admission to CIR. CSW also following for SNF rehab. CM met with the patients spouse yesterday and provided her with information on how caregivers for home works and about [facility] services. Wife states she is unable to care for him without assistance. She is hopeful for rehab prior to him returning home.   Expected Discharge Date:                  Expected Discharge Plan:  IP Rehab Facility  In-House Referral:  Clinical Social Work  Discharge planning Services  CM Consult  Post Acute Care Choice:    Choice offered to:     DME Arranged:    DME Agency:     HH Arranged:    [place] Agency:     Status of Service:  In process, will continue to follow  If discussed at Long Length of Stay Meetings, dates discussed:    Additional Comments:  Pollie Friar, RN 08/15/2017, 3:10 PM

## 2017-08-15 NOTE — Progress Notes (Signed)
Physical Therapy Treatment Patient Details Name: [name] MRN: [id number] DOB: 07-16-1940 Today's Date: 08/15/2017    History of Present Illness This 51 male admitted on 08/12/17 with falls, and confusion, and febrile.  CT of head shows no acute intracranial abnormality.  MRI of head and spine pending.  Work up ongoing, but initial Dx: AMS with likely undiagnosed dementia, possibly metabolic from acute hyponatremia vs. wernicke's encephalopathy with ataxia.  PMH includes:  ETOH abuse, A-Fib, h/o RA, hypothyroidism, lumbosacral spondylosis with recent epidural spinal injection on 3/4, OA of both knees  with knee injection on 3/2.      PT Comments    Pt with improved ambulation tolerance this date however con't to remain at increased falls risk and requires modA for transfers and safe ambulation, even with RW. Pt unable to sequence or problem solve requiring assist for walker management as well. Pt's IV did become disconnected during session. RN notified and issue addressed. Acute PT to con't to follow.   Follow Up Recommendations  CIR     Equipment Recommendations  Rolling walker with 5" wheels    Recommendations for Other Services Rehab consult     Precautions / Restrictions Precautions Precautions: Fall Restrictions Weight Bearing Restrictions: No    Mobility  Bed Mobility Overal bed mobility: Needs Assistance Bed Mobility: Supine to Sit     Supine to sit: Mod assist     General bed mobility comments: max directional verbal and tactile cues to complete task  Transfers Overall transfer level: Needs assistance Equipment used: Rolling walker (2 wheeled) Transfers: Sit to/from Stand Sit to Stand: Min assist;Mod assist Stand pivot transfers: Mod assist       General transfer comment: modA to push up from bed and complete std pvt to chair to prep for ambulation in hallway. minA when pt pushing up from arm rests of chair  Ambulation/Gait Ambulation/Gait  assistance: Mod assist Ambulation Distance (Feet): 75 Feet(x1, 50'x1) Assistive device: Rolling walker (2 wheeled) Gait Pattern/deviations: Step-to pattern;Decreased stride length;Shuffle Gait velocity: slow Gait velocity interpretation: Below normal speed for age/gender General Gait Details: modA to maintain forward momentum with walker due to impaired sequencing and being easily distracted. Pt required seated rest break  pt unable to manage walker without modA   Stairs            Wheelchair Mobility    Modified Rankin (Stroke Patients Only)       Balance Overall balance assessment: Needs assistance Sitting-balance support: Feet supported Sitting balance-Leahy Scale: Poor Sitting balance - Comments: pt able to maintain balance static but unable to maintain dynamic Postural control: Posterior lean Standing balance support: Bilateral upper extremity supported Standing balance-Leahy Scale: Poor Standing balance comment: dependent on external support                            Cognition Arousal/Alertness: Awake/alert Behavior During Therapy: Flat affect Overall Cognitive Status: Impaired/Different from baseline Area of Impairment: Orientation;Attention;Memory;Following commands;Safety/judgement;Problem solving;Awareness                 Orientation Level: Disoriented to;Place;Situation;Time Current Attention Level: Sustained Memory: Decreased short-term memory Following Commands: Follows one step commands with increased time Safety/Judgement: Decreased awareness of safety;Decreased awareness of deficits   Problem Solving: Slow processing;Decreased initiation;Difficulty sequencing;Requires verbal cues;Requires tactile cues General Comments: pt requires max verbal and tactile cues to participate in PT, unable to navigate around obstacles      Exercises  General Comments        Pertinent Vitals/Pain Pain Assessment: No/denies pain    Home  Living                      Prior Function            PT Goals (current goals can now be found in the care plan section) Progress towards PT goals: Progressing toward goals    Frequency    Min 3X/week      PT Plan Current plan remains appropriate    Co-evaluation              AM-PAC PT "6 Clicks" Daily Activity  Outcome Measure  Difficulty turning over in bed (including adjusting bedclothes, sheets and blankets)?: Unable Difficulty moving from lying on back to sitting on the side of the bed? : Unable Difficulty sitting down on and standing up from a chair with arms (e.g., wheelchair, bedside commode, etc,.)?: Unable Help needed moving to and from a bed to chair (including a wheelchair)?: A Lot Help needed walking in hospital room?: A Lot Help needed climbing 3-5 steps with a railing? : Total 6 Click Score: 8    End of Session Equipment Utilized During Treatment: Gait belt Activity Tolerance: Patient tolerated treatment well Patient left: with call bell/phone within reach;with family/visitor present;in chair;with chair alarm set Nurse Communication: Mobility status PT Visit Diagnosis: Unsteadiness on feet (R26.81);Other abnormalities of gait and mobility (R26.89);Other symptoms and signs involving the nervous system (R29.898)     Time: 1103-1130 PT Time Calculation (min) (ACUTE ONLY): 27 min  Charges:  $Gait Training: 8-22 mins $Therapeutic Activity: 8-22 mins                    G Codes:       Kittie Plater, PT, DPT Pager #: [phone] Office #: [phone]    [place] 08/15/2017, 1:11 PM

## 2017-08-15 NOTE — Progress Notes (Addendum)
Reason for consult:   Subjective: Alert, following commands appropriately.   ROS: negative except above  Examination  Vital signs in last 24 hours: Temp:  [97.7 F (36.5 C)-102.7 F (39.3 C)] 100.7 F (38.2 C) (03/26 0844) Pulse Rate:  [70-104] 104 (03/26 0844) Resp:  [17-18] 17 (03/26 0844) BP: (74-123)/(42-85) 103/68 (03/26 0844) SpO2:  [96 %-100 %] 100 % (03/26 0844)  General: Not in distress, cooperative CVS: pulse-normal rate and rhythm RS: breathing comfortably on RA Extremities: normal   Neuro: MS: Alert, oriented, follows commands CN: pupils equal and reactive,  EOMI, face symmetric, tongue midline, normal sensation over face Motor: 5/5 strength symmetric in all 4 extremities, no defitics Reflexes: 2+ bilaterally over patella, biceps, plantars: flexor Sensory and Vibration: Both decreased bilateral LEs Coordination: normal Gait: not tested  Basic Metabolic Panel: Recent Labs  Lab 08/13/17 0502 08/13/17 1734 08/14/17 0447 08/14/17 2104 08/15/17 0415  NA 127* 125* 127* 131* 132*  K 4.1 3.5 3.6 3.4* 3.7  CL 97* 95* 97* 104 105  CO2 18* 21* 20* 20* 21*  GLUCOSE 123* 121* 122* 124* 112*  BUN 9 8 10 16 12   CREATININE 0.88 0.84 0.90 1.10 0.92  CALCIUM 8.5* 8.5* 8.5* 8.3* 8.0*    CBC: Recent Labs  Lab 08/12/17 1801 08/13/17 0502 08/14/17 0447 08/15/17 0415  WBC 9.8 9.9 8.6 10.9*  NEUTROABS 4.9  --   --   --   HGB 13.8 13.0 14.1 13.1  HCT 39.2 37.7* 40.8 38.0*  MCV 96.8 95.9 96.5 94.5  PLT 180 185 183 189     Coagulation Studies: No results for input(s): LABPROT, INR in the last 72 hours.  Imaging Reviewed:    ASSESSMENT: RussellRussell M Kaufman a 77 y.o.malewith pmhx of chronic alcohol abuse, AFIB on Eliquis, DM, Hypothyroidism, RA, Hxof lumbosacral spondylosis, osteoarthritis of both knees, Hx of recentEpidural spinal injection on 3/4 and Knee injection on3/06/2017. Patient admitted for worsening generalized weakness, ataxic gait and  worsening confusion.  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 Fluid restrict, management per primary -- Continue Zosyn per primary for aspiration PNA -- F/u MRI lumbar spine  -- Folate & Vitamin B1 pending, vitamin B12 & TSH within normal limits  -- PT / OT, likely CIR on discharge -- Will follow clinically   Velna Ochs, M.D. - PGY2 08/15/2017, 9:48 AM     NEUROHOSPITALIST ADDENDUM Seen and examined the patient today. Formulated plan as documented above by PAC/Resident. I agree with recommendations as above.   Will continue to  Follow. Patient mental status has improved since yesterday. Still appears weak in both legs and has impaired sensation. Awaiting MRI and thiamine, folic acid labs.   [name] MD [facility] [phone]  If 7pm to 7am, please call on call as listed on AMION.

## 2017-08-15 NOTE — Progress Notes (Addendum)
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 Since going home 3/17 his balance has worsened, has fallen numerous times, unable to get up from floor numerous times, and increased confusion. -neurological workup has been unremarkable -3/25: suspect a fever of102, chest x-ray noted left lower lobe infiltrate, started IV Zosyn, clinically improving -also has had recurrent urinary retention requiring Foley catheter placement   Assessment & Plan:  Left lower lobe pneumonia -Febrile 3/25 AM onwards, repeat chest x-ray done noted left lower lobe infiltrate and small pleural effusion,  -Started IV Zosyn for suspected aspiration pneumonia, clinically improving now -other possibility is that he's had indolent/infection for the last week and didn't present with typical infectious symptoms secondary to his relatively immunocompromised state from chronic methotrexate use -blood cx negative x24hours -also check Resp virus panel and Urine legionella for completeness    Gait disorder/falls/Ataxia -acute onset, significantly worsened since 3/15 -MRI L spine 3/17 without acute findings, multilevel DJD -MRI Brain 3/17 unrevealing except for some atrophy and chronic microvascular changes -repeat CT head 3/23  with cerebral atrophy but no acute findings -Greatly Appreciate Neuro input, continue Thiamine/multivitamins -I also discussed with Neurosurgery Dr.Cabell 3/24, he reviewed images and felt reasonable to consider MRI T spine to eval for posterior cord/dorsal column lesions -was unable to do MRI 3/24 -PT consulting, definitely needs Rehab, Clinically much improvement in mentation with improvement in Na and Rx of infection, I wonder if indolent infection was the problem the whole time around that contributed to gait disorder -We hope CIR is an option, will consult Rehab MD -B12 -688, TSH is ok, t4 is not of value while on synthroid replacement -continue PT/OT   Hyponatremia -labs Urine Na 68 and 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 -due to LLL pneumonia -improved with Rx of Pneumonia  Urinary retention -likely due to BPH, -per wife long H/o nocturia, uses bathroom 2-3times every night, never seen a Urologist -foley placed due to recurrent retention 3/24 am -voiding trial when more ambulatory -hold off on Flomax at this time due to soft BP and risk of Orthostatic hypotension in elderly, if BP improves will consider  Acute Delirium -multifactorial, hyponatremia, pneumonia, hospital induced delirium, also has some atrophy of brain on MRI -improved with Rx of Pneumonia and improvement in sodium  ETOH use -drinks 2-3glasses of wine/day -last drink 3/15, continue Thiamine, MVI, B1 level pending -out of window for withdrawal  P.Afib -in NSR,  continue BB and eliquis -restarted flecainide   HTN -on ARB and metoprolol, will continue  metoprolol for rate control with holding parameters and stopped ARB  Hypothyroidism -continue synthroid, recent TSH ok  DM type 2 -hold metformin, SSI  DVT prophylaxis: on eliquis Code Status: Full COde  Family Communication: Wife [name] at bedside Disposition Plan: Hopefully inpatient rehab-CIR     Consultants:   NEuro  D/w NSG  Dr.Cabel   Procedures:   Antimicrobials:    Subjective: -feels better, breathing ok, walked with physical therapy down the hall  Objective: Vitals:   08/14/17 1732 08/14/17 1958 08/14/17 2300 08/15/17 0844  BP: 101/85 (!) 74/42 96/60 103/68  Pulse: 93 73 70 (!) 104  Resp: 18 18 18 17   Temp: 99.9 F (37.7 C) 97.7 F (36.5 C)  (!) 100.7 F (38.2 C)  TempSrc: Oral Axillary  Oral  SpO2: 96% 99% 100% 100%  Weight:      Height:        Intake/Output Summary (Last 24 hours) at 08/15/2017 1343 Last data filed at 08/15/2017 1040 Gross per 24 hour  Intake 1879.61 ml  Output 850 ml  Net 1029.61 ml   Filed Weights   08/12/17 1635 08/12/17 2213  Weight: 71.7 kg (158 lb) 69.4 kg (153 lb)    Examination:  Gen: Awake, Alert, Oriented X 3, lucid, bright, able to have a conversation, laugh etc HEENT: no JVD Lungs: few ronchi on left CVS: RRR,No Gallops,Rubs or new Murmurs Abd: soft, Non tender, non distended, BS present Extremities: No Cyanosis, Clubbing or edema Skin: no new rashes Gen: somnolent, arousable, answers some questions appropriately, otherwise confused HEENT: PERRLA, Lungs: few scattered ronchi esp on left CVS: S1S2/RRR tachy Abd: soft, Non tender, non distended, BS present Extremities: No Cyanosis, Clubbing or edema Skin: scabs on his knees Neuro: some confusion, mild lethargy, cranial nerves intact, Motor 5/5, sensations ? Decreased but confusion limits interpretation -DTR 2plus, confused about proprioception Psychiatry: pleasant but confused   Data Reviewed:   CBC: Recent Labs  Lab 08/12/17 1801 08/13/17 0502 08/14/17 0447 08/15/17 0415  WBC 9.8 9.9 8.6 10.9*  NEUTROABS 4.9  --   --   --   HGB 13.8 13.0 14.1 13.1  HCT 39.2 37.7* 40.8 38.0*  MCV 96.8 95.9 96.5 94.5  PLT 180 185 183 103   Basic Metabolic Panel: Recent Labs  Lab 08/13/17 0502 08/13/17 1734 08/14/17 0447 08/14/17 2104 08/15/17 0415  NA 127* 125* 127* 131* 132*  K 4.1 3.5 3.6  3.4* 3.7  CL 97* 95* 97* 104 105  CO2 18* 21* 20* 20* 21*  GLUCOSE 123* 121* 122* 124* 112*  BUN 9 8 10 16 12   CREATININE 0.88 0.84 0.90 1.10 0.92  CALCIUM 8.5* 8.5* 8.5* 8.3* 8.0*   GFR: Estimated Creatinine Clearance: 63.9 mL/min (by C-G formula based on SCr of 0.92 mg/dL). Liver Function Tests: Recent Labs  Lab 08/12/17 1801  AST 45*  ALT 27  ALKPHOS 66  BILITOT 0.6  PROT 6.3*  ALBUMIN 3.1*   No results for input(s): LIPASE, AMYLASE in the last 168 hours. Recent Labs  Lab 08/13/17 1734  AMMONIA 13   Coagulation Profile: No results for input(s): INR, PROTIME in the last 168 hours. Cardiac Enzymes: No results for input(s): CKTOTAL, CKMB, CKMBINDEX, TROPONINI in the last 168 hours. BNP (last 3 results) No results for input(s): PROBNP in the last 8760 hours. HbA1C: Recent Labs    08/13/17 0815  HGBA1C 6.2*   CBG: Recent Labs  Lab 08/14/17 1131 08/14/17 1644 08/14/17 2202 08/15/17 0634 08/15/17 1144  GLUCAP 138* 206* 124* 109* 149*   Lipid Profile: No results for input(s): CHOL, HDL, LDLCALC, TRIG, CHOLHDL, LDLDIRECT in the last 72 hours. Thyroid Function Tests: Recent Labs    08/13/17 0859  TSH 2.228  FREET4 1.89*   Anemia Panel: Recent Labs    08/13/17 0502  VITAMINB12 688   Urine  analysis:    Component Value Date/Time   COLORURINE AMBER (A) 08/14/2017 1154   APPEARANCEUR CLEAR 08/14/2017 1154   LABSPEC 1.019 08/14/2017 1154   PHURINE 6.0 08/14/2017 1154   GLUCOSEU NEGATIVE 08/14/2017 1154   HGBUR LARGE (A) 08/14/2017 1154   BILIRUBINUR NEGATIVE 08/14/2017 1154   KETONESUR NEGATIVE 08/14/2017 1154   PROTEINUR 30 (A) 08/14/2017 1154   NITRITE NEGATIVE 08/14/2017 1154   LEUKOCYTESUR NEGATIVE 08/14/2017 1154   Sepsis Labs: @LABRCNTIP (procalcitonin:4,lacticidven:4)  ) Recent Results (from the past 240 hour(s))  Culture, Urine     Status: None   Collection Time: 08/14/17  8:59 AM  Result Value Ref Range Status   Specimen Description  URINE, CATHETERIZED  Final   Special Requests NONE  Final   Culture   Final    NO GROWTH Performed at [facility], [place] [address] [place], [place] [address]    Report Status 08/15/2017 FINAL  Final  Culture, blood (routine x 2)     Status: None (Preliminary result)   Collection Time: 08/14/17 10:11 AM  Result Value Ref Range Status   Specimen Description BLOOD RIGHT HAND  Final   Special Requests   Final    BOTTLES DRAWN AEROBIC ONLY Blood Culture adequate volume   Culture   Final    NO GROWTH < 24 HOURS Performed at [facility], [place] [address] [place], [place] [address]    Report Status PENDING  Incomplete  Culture, blood (routine x 2)     Status: None (Preliminary result)   Collection Time: 08/14/17 10:14 AM  Result Value Ref Range Status   Specimen Description BLOOD RIGHT HAND  Final   Special Requests   Final    BOTTLES DRAWN AEROBIC ONLY Blood Culture results may not be optimal due to an inadequate volume of blood received in culture bottles   Culture   Final    NO GROWTH < 24 HOURS Performed at [facility], [place] [address], [place],  [address]    Report Status PENDING  Incomplete         Radiology Studies: Dg Chest Port 1 View  Result Date: 08/14/2017 CLINICAL DATA:  Fever EXAM: PORTABLE CHEST 1 VIEW COMPARISON:  PA and lateral chest x-ray of August 13, 2017 FINDINGS: There is new hazy increased density at the left lung base. There is partial obscuration of the left hemidiaphragm. There is a small left pleural effusion. The right lung is clear. The heart is top-normal to mildly enlarged. The pulmonary vascularity is normal. There is calcification in the wall of the aortic arch. The bony thorax exhibits no acute abnormality. IMPRESSION: Left lower lobe pneumonia and small left pleural effusion. Followup PA and lateral chest X-ray is recommended in 3-4 weeks following trial of antibiotic therapy to ensure resolution and exclude underlying  malignancy. Thoracic aortic atherosclerosis. Electronically Signed   By: David  Martinique M.D.   On: 08/14/2017 13:15        Scheduled Meds: . apixaban  5 mg Oral BID  . flecainide  100 mg Oral Q12H  . folic acid  1 mg Oral Daily  . insulin aspart  0-9 Units Subcutaneous TID WC  . levothyroxine  75 mcg Oral QAC breakfast  . metoprolol tartrate  25 mg Oral BID  . pantoprazole  40 mg Oral Daily  . rosuvastatin  10 mg Oral Daily  . vitamin B-12  1,000 mcg Oral Daily   Continuous Infusions: . sodium chloride 75 mL/hr  at 08/15/17 1223  . piperacillin-tazobactam (ZOSYN)  IV Stopped (08/15/17 1230)  . thiamine injection Stopped (08/15/17 1341)     LOS: 3 days    Time spent: 70mn    PDomenic Polite MD [facility] Page via [email or web address], password [email or web address] After 7PM please contact night-coverage  08/15/2017, 1:43 PM

## 2017-08-15 NOTE — Consult Note (Signed)
Physical Medicine and Rehabilitation Consult   Reason for Consult: Encephalopathy Referring Physician: Dr. Tana Coast    HPI: [name] is a 77 y.o. 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 He was discharged to home but continued to worsen with onset of sundowning and was admitted 08/11/17 for work up.  Family reported gradual memory decline which has been worse in the past few weeks to months. CT head reviewed, unremarkable for acute intracranial process. Per report, chronic microangiopathic ischemic changes of periventricular and subcortical white matter with cerebral atrophy.  He was noted to be hyponatremic with Na-126. CXR negative.  UDS negative. Respiratory panel negative. Patient noted to be somnolent with significant cognitive deficits.   Neurology consulted and felt that patient with metabolic encephalopathy due to acute hyponatremia, possible ETOH withdrawal v/s Wernicke's encephalopathy v/s underlying dementia or sensory ataxia. He has history of daily alcohol use and was started on IV thiamine. He developed fever of 102 on 3/25, was pan cultured and was started on IV Zosyn for likely aspiration LLL PNA. Foley placed for urinary retention. MRI thoracic spine showed mild degenerative spondylosis but no acute abnormality. Therapy ongoing and CIR recommended due to functional decline. Discussed case with rehab team and Cards.  Will discuss with hospitalist.   Review of Systems  Unable to perform ROS: Mental acuity     Past Medical History:  Diagnosis Date  . DM (diabetes mellitus) ([place])   . GERD (gastroesophageal reflux disease)   . Hyperlipidemia   . Hypertension   . 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 Osteoarthritis   . Osteopenia   . Rheumatoid arthritis ([place])   . Thyroid disease     Past Surgical History:   Procedure Laterality Date  . COLONOSCOPY      Family History  Problem Relation Age of Onset  . Diabetes Mellitus I Father   . Diabetes Mellitus I Mother   . CAD Brother     Social History: Married. Retired Network engineer. Independent till a few weeks PTA?  Per reports that he has quit smoking 2016.  His smoking use included cigarettes. He has never used smokeless tobacco.  Per reports that he drinks alcohol. He reports that he does not use drugs.    Allergies  Allergen Reactions  . Hydrocodone Nausea Only    Medications Prior to Admission  Medication Sig Dispense Refill  . apixaban (ELIQUIS) 5 MG TABS tablet Take 5 mg by mouth 2 (two) times daily.    . diclofenac sodium (VOLTAREN) 1 % GEL Apply topically 4 (four) times daily.    Marland Kitchen esomeprazole (NEXIUM) 40 MG capsule Take 40 mg by mouth daily after supper.   1  . folic acid (FOLVITE) 1 MG tablet Take 1 mg by mouth daily.    . irbesartan (AVAPRO) 150 MG tablet Take 150 mg by mouth daily.     Marland Kitchen levothyroxine (SYNTHROID, LEVOTHROID) 75 MCG tablet Take 75 mcg by mouth daily before breakfast.    . metFORMIN (GLUCOPHAGE-XR) 500 MG 24 hr tablet Take 500 mg by mouth 2 (two) times daily.   0  . methotrexate (50 MG/ML) 1 g injection Inject into the vein once.    . metoprolol tartrate (LOPRESSOR) 25 MG tablet Take 1 tablet (25 mg total) by mouth 2 (two) times daily. 180 tablet 2  . rosuvastatin (CRESTOR)  10 MG tablet Take 10 mg by mouth daily.  0    Home: Home Living Family/patient expects to be discharged to:: Private residence Living Arrangements: Spouse/significant other Available Help at Discharge: Family, Friend(s), Available PRN/intermittently, Available 24 hours/day Type of Home: House Home Access: Stairs to enter [employer] of Steps: 2-3 Home Layout: Two level, Able to live on main level with bedroom/bathroom Alternate Level Stairs-Number of Steps: full flight  Bathroom Shower/Tub: Multimedia programmer:  Handicapped height Home Equipment: Environmental consultant - 2 wheels Additional Comments: Pt lives with wife.  Sons are able to assist some.  They have moved bed from upstairs bedroom onto the main level   Functional History: Prior Function Level of Independence: Independent Comments: Pt was fully independent including community activities, and driving up until ~5/03 at which time he started to have falls and confusion.  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 minA when pt pushing up from arm rests of chair Ambulation/Gait Ambulation/Gait assistance: Mod assist Ambulation Distance (Feet): 75 Feet(x1, 50'x1) Assistive device: Rolling walker (2 wheeled) Gait Pattern/deviations: Step-to pattern, Decreased stride length, Shuffle General Gait Details: modA to maintain forward momentum with walker due to impaired sequencing and being easily distracted. Pt required seated rest break  pt unable to manage walker without modA Gait velocity: slow Gait velocity interpretation: Below normal speed for age/gender    ADL: ADL Overall ADL's : Needs assistance/impaired Eating/Feeding: Minimal assistance, Bed level Grooming: Wash/dry hands, Wash/dry face, Brushing hair, Moderate assistance, Sitting Upper Body Bathing: Maximal assistance, Sitting Lower Body Bathing: Maximal assistance, Sit to/from stand Upper Body Dressing : Maximal  assistance, Sitting Lower Body Dressing: Total assistance, Sit to/from stand Lower Body Dressing Details (indicate cue type and reason): Pt unable to access feet to don socks.  Unable to problem solve through possible solutions or to conclude that he was unable to perform task.  He just sat wiggling his feet while holding his socks.   Toilet Transfer: Moderate assistance, Stand-pivot, BSC Toilet Transfer Details (indicate cue type and reason): max verbal cues for sequencing and mod A for balance and to assist with weight shift  Toileting- Clothing Manipulation and Hygiene: Maximal assistance, Sit to/from stand Functional mobility during ADLs: Moderate assistance  Cognition: Cognition Overall Cognitive Status: Impaired/Different from baseline Orientation Level: Disoriented to place, Disoriented to time, Oriented to person Cognition Arousal/Alertness: Awake/alert Behavior During Therapy: Flat affect Overall Cognitive Status: Impaired/Different from baseline Area of Impairment: Orientation, Attention, Memory, Following commands, Safety/judgement, Problem solving, Awareness Orientation Level: Disoriented to, Place, Situation, Time Current Attention Level: Sustained Memory: Decreased short-term memory Following Commands: Follows one step commands with increased time Safety/Judgement: Decreased awareness of safety, Decreased awareness of deficits Awareness: (none) Problem Solving: Slow processing, Decreased initiation, Difficulty sequencing, Requires verbal cues, Requires tactile cues General Comments: pt requires max verbal and tactile cues to participate in PT, unable to navigate around obstacles   Blood pressure 121/63, pulse 66, temperature 99.9 F (37.7  C), temperature source Oral, resp. rate 18, height 5' 7"  (1.702 m), weight 69.4 kg (153 lb), SpO2 99 %. Physical Exam  Nursing note and vitals reviewed. Constitutional: He appears well-developed and well-nourished. He appears listless.  No distress.  HENT:  Head: Normocephalic and atraumatic.  Eyes: Pupils are equal, round, and reactive to light. Conjunctivae and EOM are normal.  Neck: Normal range of motion. Neck supple.  Cardiovascular: Normal rate and regular rhythm.  Respiratory: Effort normal. No stridor. No respiratory distress. He has decreased breath sounds. He exhibits no tenderness.  GI: Soft. Bowel sounds are normal. He exhibits no distension. There is no tenderness.  Musculoskeletal: He exhibits no edema or tenderness.  Healing abrasions bilateral knees  Neurological: He appears listless. He is disoriented.  Soft voice.  Oriented to self only.  Left lean Unable to follow simple motor consistently Motor exam limited by inability to follow commands, however, spontaneously moving LUE/LLE.  Skin: Skin is warm and dry. He is not diaphoretic.  Psychiatric: His affect is blunt. His speech is delayed. He is slowed. Cognition and memory are impaired. He is inattentive.    Results for orders placed or performed during the hospital encounter of 08/12/17 (from the past 24 hour(s))  Glucose, capillary     Status: Abnormal   Collection Time: 08/15/17 11:44 AM  Result Value Ref Range   Glucose-Capillary 149 (H) 65 - 99 mg/dL   Comment 1 Notify RN    Comment 2 Document in Chart   Respiratory Panel by PCR     Status: None   Collection Time: 08/15/17 12:34 PM  Result Value Ref Range   Adenovirus NOT DETECTED NOT DETECTED   Coronavirus 229E NOT DETECTED NOT DETECTED   Coronavirus HKU1 NOT DETECTED NOT DETECTED   Coronavirus NL63 NOT DETECTED NOT DETECTED   Coronavirus OC43 NOT DETECTED NOT DETECTED   Metapneumovirus NOT DETECTED NOT DETECTED   Rhinovirus / Enterovirus NOT DETECTED NOT DETECTED   Influenza A NOT DETECTED NOT DETECTED   Influenza B NOT DETECTED NOT DETECTED   Parainfluenza Virus 1 NOT DETECTED NOT DETECTED   Parainfluenza Virus 2 NOT DETECTED NOT DETECTED   Parainfluenza Virus 3 NOT DETECTED NOT  DETECTED   Parainfluenza Virus 4 NOT DETECTED NOT DETECTED   Respiratory Syncytial Virus NOT DETECTED NOT DETECTED   Bordetella pertussis NOT DETECTED NOT DETECTED   Chlamydophila pneumoniae NOT DETECTED NOT DETECTED   Mycoplasma pneumoniae NOT DETECTED NOT DETECTED  Glucose, capillary     Status: Abnormal   Collection Time: 08/15/17  4:17 PM  Result Value Ref Range   Glucose-Capillary 150 (H) 65 - 99 mg/dL  Glucose, capillary     Status: None   Collection Time: 08/15/17 10:55 PM  Result Value Ref Range   Glucose-Capillary 97 65 - 99 mg/dL   Comment 1 Notify RN    Comment 2 Document in Chart   Glucose, capillary     Status: Abnormal   Collection Time: 08/16/17  6:01 AM  Result Value Ref Range   Glucose-Capillary 120 (H) 65 - 99 mg/dL   Comment 1 Notify RN    Comment 2 Document in Chart    Mr Thoracic Spine Wo Contrast  Result Date: 08/15/2017 CLINICAL DATA:  Initial evaluation for midthoracic spine pain, frequent falls. EXAM: MRI THORACIC SPINE WITHOUT CONTRAST TECHNIQUE: Multiplanar, multisequence MR imaging of the thoracic spine was performed. No intravenous contrast was administered. COMPARISON:  None. FINDINGS: Alignment: Dextroscoliosis with straightening of the midthoracic kyphosis. No listhesis.  Vertebrae: Vertebral body heights are maintained without evidence for acute or chronic fracture. Few scattered chronic endplate Schmorl's nodes noted. Bone marrow signal intensity within normal limits. Benign hemangioma noted within the T12 vertebral body. No other discrete or worrisome osseous lesions. No abnormal marrow edema. Cord: Signal intensity within the thoracic spinal cord is normal. Normal cord morphology and caliber. Conus medullaris terminates at approximately the L1 level. Paraspinal and other soft tissues: Paraspinous soft tissues demonstrate no acute abnormality. Visualized visceral structures within normal limits. 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 Disc levels: T1-2:   Unremarkable. T2-3: Unremarkable. T3-4:  Mild disc bulge.  No stenosis. T4-5:  Unremarkable. T5-6:  Unremarkable. T6-7:  Unremarkable. T7-8: Small central disc protrusion indents the ventral thecal sac. No significant cord deformity or stenosis. T8-9: Intervertebral disc space narrowing with mild diffuse disc bulge and reactive endplate changes. Mild facet hypertrophy. No stenosis. T9-10: Mild facet hypertrophy.  No stenosis. T10-11: Shallow right paracentral disc protrusion indents the right ventral thecal sac. No significant stenosis or cord deformity. T11-12:  Unremarkable. T12-L1:  Unremarkable. IMPRESSION: 1. No acute abnormality within the thoracic spine. Normal MRI appearance of the thoracic spinal cord. 2. Mild multilevel degenerative spondylolysis as above without significant spinal stenosis. Most notable findings include small disc protrusions at T7-8 and T10-11. 3. Dextroscoliosis with straightening of the normal thoracic kyphosis. Electronically Signed   By: Jeannine Boga M.D.   On: 08/15/2017 22:43   Dg Chest Port 1 View  Result Date: 08/14/2017 CLINICAL DATA:  Fever EXAM: PORTABLE CHEST 1 VIEW COMPARISON:  PA and lateral chest x-ray of August 13, 2017 FINDINGS: There is new hazy increased density at the left lung base. There is partial obscuration of the left hemidiaphragm. There is a small left pleural effusion. The right lung is clear. The heart is top-normal to mildly enlarged. The pulmonary vascularity is normal. There is calcification in the wall of the aortic arch. The bony thorax exhibits no acute abnormality. IMPRESSION: Left lower lobe pneumonia and small left pleural effusion. Followup PA and lateral chest X-ray is recommended in 3-4 weeks following trial of antibiotic therapy to ensure resolution and exclude underlying malignancy. Thoracic aortic atherosclerosis. Electronically Signed   By: David  Martinique M.D.   On: 08/14/2017 13:15    Assessment/Plan: Diagnosis: Metabolic  encephalopathy Labs and images independently reviewed.  Records reviewed and summated above.  1. Does the need for close, 24 hr/day medical supervision in concert with the patient's rehab needs make it unreasonable for this patient to be served in a less intensive setting? Potentially  2. 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 Due to safety, disease management and patient education, does the patient require 24 hr/day rehab nursing? Yes 4. Does the patient require coordinated care of a physician, rehab nurse, PT (1-2 hrs/day, 5 days/week), OT (1-2 hrs/day, 5 days/week) and SLP (1-2 hrs/day, 5 days/week) to address physical and  functional deficits in the context of the above medical diagnosis(es)? Yes Addressing deficits in the following areas: balance, endurance, locomotion, strength, transferring, bathing, dressing, toileting, cognition, language and psychosocial support 5. Can the patient actively participate in an intensive therapy program of at least 3 hrs of therapy per day at least 5 days per week? Potentially 6. The potential for patient to make measurable gains while on inpatient rehab is excellent 7. Anticipated functional outcomes upon discharge from inpatient rehab are  min assist  with PT, min assist with OT, min assist and mod assist with SLP. 8. Estimated rehab length of stay to reach the above functional goals is: 19-24 days. 9. Anticipated D/C setting: Home 10. Anticipated post D/C treatments: HH therapy and Home excercise program 11. Overall Rehab/Functional Prognosis: good and fair  RECOMMENDATIONS: This patient's condition is appropriate for continued rehabilitative care in the following setting: Will need to inquire further regarding previous level of functioning as there appear to be conflicting histories.  If deficits (cognitive and functional) are acute in nature, recommend CR after completion of medical workup and medically stable if caregiver support available.  Patient has agreed to participate in recommended program. Potentially Note that insurance prior authorization may be required for reimbursement for recommended care.  Comment: Rehab Admissions Coordinator to follow up.  Delice Lesch, MD, ABPMR Bary Leriche, PA-C 08/16/2017

## 2017-08-15 NOTE — Consult Note (Signed)
CARDIOLOGY CONSULT NOTE  Patient ID: [name] MRN: [id number] DOB/AGE: [birth year] 77 y.o.  Admit date: 08/12/2017 Referring Physician  Darryll Capers, MD Primary Physician:  Merrilee Seashore, MD Reason for Consultation  Paroxysmal A. Fib  HPI: [name]  is a 77 y.o. male  With paroxysmal atrial fibrillation, hypertension, hyperlipidemia, controlled diabetes mellitus without hyperglycemia, rheumatoid arthritis on chronic methotrexate therapy, who is fairly active 2 and is approximately [facility], admitted with altered mental status and failure to thrive 3 days ago.  Over the past 2-3 weeks, patient has not been himself, episodes of confusion, has had couple falls and on presentation to the emergency room was very confused and was found to be hyponatremic.  Further workup blood the diagnosis of pneumonia, now he is being treated for the same.  There was also confusion about flecainide and atrial fibrillation, I was requested to see the patient for management of same.  Patient is slow to respond but is appropriate.  Recognized me on walking in to the room.  Denies any palpitations or chest pain.  Has been having fever but no chills.  States that his mind appears to be clearing.  His wife is present.  Past Medical History:  Diagnosis Date  . DM (diabetes mellitus) ([place])   . GERD (gastroesophageal reflux disease)   . Hyperlipidemia   . Hypertension   . 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 Osteoarthritis   . Osteopenia   . Rheumatoid arthritis ([place])   . Thyroid disease      History reviewed. No pertinent surgical history.   Family History  Problem Relation Age of Onset  . Diabetes Mellitus I Father   . Diabetes Mellitus I Mother   . CAD Brother      Social History: Social History   Socioeconomic History  . Marital status: Married    Spouse name: Not on file  . Number of children: Not on file  . Years of education: Not on file  . Highest education level: Not  on file  Occupational History  . Not on file  Social Needs  . Financial resource strain: Not on file  . Food insecurity:    Worry: Not on file    Inability: Not on file  . Transportation needs:    Medical: Not on file    Non-medical: Not on file  Tobacco Use  . Smoking status: Former Smoker    Types: Cigarettes  . Smokeless tobacco: Never Used  Substance and Sexual Activity  . Alcohol use: Yes    Alcohol/week: 0.0 oz    Comment: "once in a while"  . Drug use: No  . Sexual activity: Not on file  Lifestyle  . Physical activity:    Days per week: Not on file    Minutes per session: Not on file  . Stress: Not on file  Relationships  . Social connections:    Talks on phone: Not on file    Gets together: Not on file    Attends religious service: Not on file    Active member of club or organization: Not on file    Attends meetings of clubs or organizations: Not on file    Relationship status: Not on file  . Intimate partner violence:    Fear of current or ex partner: Not on file    Emotionally abused: Not on file    Physically abused: Not on file    Forced sexual activity: Not on file  Other Topics  Concern  . Not on file  Social History Narrative  . Not on file     Medications Prior to Admission  Medication Sig Dispense Refill Last Dose  . apixaban (ELIQUIS) 5 MG TABS tablet Take 5 mg by mouth 2 (two) times daily.   08/12/2017 at 0900  . diclofenac sodium (VOLTAREN) 1 % GEL Apply topically 4 (four) times daily.   Past Week at Unknown time  . esomeprazole (NEXIUM) 40 MG capsule Take 40 mg by mouth daily after supper.   1 08/11/2017 at Unknown time  . folic acid (FOLVITE) 1 MG tablet Take 1 mg by mouth daily.   08/11/2017 at Unknown time  . irbesartan (AVAPRO) 150 MG tablet Take 150 mg by mouth daily.    08/12/2017 at 0900  . levothyroxine (SYNTHROID, LEVOTHROID) 75 MCG tablet Take 75 mcg by mouth daily before breakfast.   08/12/2017 at Unknown time  . metFORMIN (GLUCOPHAGE-XR)  500 MG 24 hr tablet Take 500 mg by mouth 2 (two) times daily.   0 08/12/2017 at Unknown time  . methotrexate (50 MG/ML) 1 g injection Inject into the vein once.   08/12/2017  . metoprolol tartrate (LOPRESSOR) 25 MG tablet Take 1 tablet (25 mg total) by mouth 2 (two) times daily. 180 tablet 2 08/12/2017 at 0900  . rosuvastatin (CRESTOR) 10 MG tablet Take 10 mg by mouth daily.  0 08/12/2017 at Unknown time    Review of Systems - Negative except fever, chills, altered mental status, chronic arthritis of his hands, lack of appetite.    Physical Exam: Blood pressure 103/68, pulse (!) 104, temperature (!) 100.7 F (38.2 C), temperature source Oral, resp. rate 17, height 5' 7"  (1.702 m), weight 69.4 kg (153 lb), SpO2 100 %.    Gen.: Appears to be sedate and withdrawn, no acute distress. Skin: Is warm to touch.  No rash. Heart: S1 and S2 is normal, there is no gallop or murmur. Lungs: Lungs appear to be clear.  Mild decreased breath sounds at the bases. Abdomen: No organomegaly, bowel sounds heard in all 4 quadrants, nontender and nondistended. Extremities: Full range of motion.  No edema or tenderness. Vascular exam: Normal carotid and bilateral pupils this.  Upper extremity pulses are equal.  Labs:   Lab Results  Component Value Date   WBC 10.9 (H) 08/15/2017   HGB 13.1 08/15/2017   HCT 38.0 (L) 08/15/2017   MCV 94.5 08/15/2017   PLT 189 08/15/2017    Recent Labs  Lab 08/12/17 1801  08/15/17 0415  NA 126*   < > 132*  K 4.2   < > 3.7  CL 98*   < > 105  CO2 19*   < > 21*  BUN 14   < > 12  CREATININE 1.13   < > 0.92  CALCIUM 8.9   < > 8.0*  PROT 6.3*  --   --   BILITOT 0.6  --   --   ALKPHOS 66  --   --   ALT 27  --   --   AST 45*  --   --   GLUCOSE 131*   < > 112*   < > = values in this interval not displayed.   HEMOGLOBIN A1C Lab Results  Component Value Date   HGBA1C 6.2 (H) 08/13/2017   MPG 131.24 08/13/2017   TSH Recent Labs    08/13/17 0859  TSH 2.228     Radiology: Dg Chest 2 View  Result Date:  08/13/2017 CLINICAL DATA:  SIADH EXAM: CHEST - 2 VIEW COMPARISON:  01/16/2016 FINDINGS: The heart size and mediastinal contours are within normal limits. Both lungs are clear. The visualized skeletal structures are unremarkable. IMPRESSION: No active cardiopulmonary disease. Electronically Signed   By: Inez Catalina M.D.   On: 08/13/2017 17:55   Dg Chest Port 1 View  Result Date: 08/14/2017 CLINICAL DATA:  Fever EXAM: PORTABLE CHEST 1 VIEW COMPARISON:  PA and lateral chest x-ray of August 13, 2017 FINDINGS: There is new hazy increased density at the left lung base. There is partial obscuration of the left hemidiaphragm. There is a small left pleural effusion. The right lung is clear. The heart is top-normal to mildly enlarged. The pulmonary vascularity is normal. There is calcification in the wall of the aortic arch. The bony thorax exhibits no acute abnormality. IMPRESSION: Left lower lobe pneumonia and small left pleural effusion. Followup PA and lateral chest X-ray is recommended in 3-4 weeks following trial of antibiotic therapy to ensure resolution and exclude underlying malignancy. Thoracic aortic atherosclerosis. Electronically Signed   By: David  Martinique M.D.   On: 08/14/2017 13:15    Scheduled Meds: . apixaban  5 mg Oral BID  . flecainide  100 mg Oral Q12H  . folic acid  1 mg Oral Daily  . insulin aspart  0-9 Units Subcutaneous TID WC  . levothyroxine  75 mcg Oral QAC breakfast  . metoprolol tartrate  25 mg Oral BID  . pantoprazole  40 mg Oral Daily  . rosuvastatin  10 mg Oral Daily  . vitamin B-12  1,000 mcg Oral Daily   Continuous Infusions: . sodium chloride    . piperacillin-tazobactam (ZOSYN)  IV 3.375 g (08/15/17 0556)  . thiamine injection Stopped (08/14/17 2219)   PRN Meds:.acetaminophen **OR** acetaminophen, diclofenac sodium, LORazepam, ondansetron **OR** ondansetron (ZOFRAN) IV  CARDIAC STUDIES:   EKG 08/12/2017: Normal  sinus rhythm at the rate of 70 bpm, normal axis, low-voltage complexes.  Echocardiogram 05/20/2015: Office Left ventricle cavity is normal in size. Normal global wall motion. Normal diastolic filling pattern. Calculated EF 63%. Trace mitral regurgitation.  Trace tricuspid regurgitation. Trace pulmonic regurgitation.  Essentially normal echocardiogram.  Lexiscan myoview stress test 05/08/2015: Office 1. The resting electrocardiogram demonstrated normal sinus rhythm, normal resting conduction and no resting arrhythmias.  Stress EKG is non-diagnostic for ischemia as it a pharmacologic stress using Lexiscan. Stress symptoms included dyspnea. 2. The perfusion imaging study demonstrates mild gut uptake artifact in the inferior wall but no evidence of ischemia or scar.  Left ventricle is normal in size in both rest and stress images.  Left ventricular systolic function calculated by QGS was 61%.  This is a low risk study.   ASSESSMENT AND PLAN:  1. Paroxysmal atrial fibrillation, presently maintaining sinus rhythm. CHA2DS2-VASc Score is 4 with yearly risk of stroke of 4%.  HAS-Bled score is 1 and estimated major bleeding in one year is 1 % 2.  Diabetes mellitus type II controlled without hyperglycemia 3.  Hyponatremia from sepsis and pneumonia 4.  Altered mental status secondary to hyponatremia 5.  Pneumonia, community-acquired 6.  Hypertension, essential  Recommendation: He should be started back on flecainide 100 mg p.o. B.i.d. Which was on previously and did maintain sinus rhythm.  I'd last seen him almost a year ago.  No clinical evidence of congestive heart failure.  Although he has hypertension, has low normal blood pressure, if this becomes an issue, flecainide dose may need to be readjusted to 50  mg p.o. B.i.d.  Diabetes is well controlled.  His mental status is improved.  May not be a bad idea to check for influenza.  I will repeat EKG in 48 hours to follow-up on flecainide and QT interval.   Thank for the consultation.  Adrian Prows, MD 08/15/2017, 10:48 AM [facility]. [place] Pager: [phone] Office: [phone] If no answer Cell [phone]

## 2017-08-15 NOTE — Progress Notes (Signed)
Patient is complaining that he is cold I have put 5-6 blankets on patient also now have put heating pads on his feet and back of his neck will check on him again to see how things are going.

## 2017-08-16 ENCOUNTER — Other Ambulatory Visit: Payer: Self-pay

## 2017-08-16 ENCOUNTER — Encounter (HOSPITAL_COMMUNITY): Payer: Self-pay | Admitting: General Practice

## 2017-08-16 DIAGNOSIS — M170 Bilateral primary osteoarthritis of knee: Secondary | ICD-10-CM

## 2017-08-16 DIAGNOSIS — E039 Hypothyroidism, unspecified: Secondary | ICD-10-CM

## 2017-08-16 DIAGNOSIS — E1169 Type 2 diabetes mellitus with other specified complication: Secondary | ICD-10-CM

## 2017-08-16 DIAGNOSIS — R509 Fever, unspecified: Secondary | ICD-10-CM

## 2017-08-16 DIAGNOSIS — R269 Unspecified abnormalities of gait and mobility: Secondary | ICD-10-CM

## 2017-08-16 DIAGNOSIS — E876 Hypokalemia: Secondary | ICD-10-CM

## 2017-08-16 DIAGNOSIS — D62 Acute posthemorrhagic anemia: Secondary | ICD-10-CM

## 2017-08-16 DIAGNOSIS — I480 Paroxysmal atrial fibrillation: Secondary | ICD-10-CM

## 2017-08-16 DIAGNOSIS — E222 Syndrome of inappropriate secretion of antidiuretic hormone: Secondary | ICD-10-CM

## 2017-08-16 DIAGNOSIS — M48062 Spinal stenosis, lumbar region with neurogenic claudication: Secondary | ICD-10-CM

## 2017-08-16 DIAGNOSIS — E871 Hypo-osmolality and hyponatremia: Secondary | ICD-10-CM

## 2017-08-16 DIAGNOSIS — E1142 Type 2 diabetes mellitus with diabetic polyneuropathy: Secondary | ICD-10-CM

## 2017-08-16 DIAGNOSIS — R339 Retention of urine, unspecified: Secondary | ICD-10-CM

## 2017-08-16 DIAGNOSIS — M069 Rheumatoid arthritis, unspecified: Secondary | ICD-10-CM

## 2017-08-16 LAB — GLUCOSE, CAPILLARY
GLUCOSE-CAPILLARY: 225 mg/dL — AB (ref 65–99)
Glucose-Capillary: 120 mg/dL — ABNORMAL HIGH (ref 65–99)
Glucose-Capillary: 112 mg/dL — ABNORMAL HIGH (ref 65–99)
Glucose-Capillary: 157 mg/dL — ABNORMAL HIGH (ref 65–99)
Glucose-Capillary: 125 mg/dL — ABNORMAL HIGH (ref 65–99)

## 2017-08-16 LAB — CBC
HCT: 32.4 % — ABNORMAL LOW (ref 39.0–52.0)
Hemoglobin: 11.1 g/dL — ABNORMAL LOW (ref 13.0–17.0)
MCH: 32.2 pg (ref 26.0–34.0)
MCHC: 34.3 g/dL (ref 30.0–36.0)
MCV: 93.9 fL (ref 78.0–100.0)
Platelets: 189 10*3/uL (ref 150–400)
RBC: 3.45 MIL/uL — AB (ref 4.22–5.81)
RDW: 13.7 % (ref 11.5–15.5)
WBC: 7.5 10*3/uL (ref 4.0–10.5)

## 2017-08-16 LAB — BASIC METABOLIC PANEL
ANION GAP: 7 (ref 5–15)
BUN: 6 mg/dL (ref 6–20)
CALCIUM: 7.9 mg/dL — AB (ref 8.9–10.3)
CHLORIDE: 107 mmol/L (ref 101–111)
CO2: 18 mmol/L — ABNORMAL LOW (ref 22–32)
Creatinine, Ser: 0.76 mg/dL (ref 0.61–1.24)
GFR calc non Af Amer: >60 mL/min (ref >60)
Glucose, Bld: 131 mg/dL — ABNORMAL HIGH (ref 65–99)
POTASSIUM: 3.2 mmol/L — AB (ref 3.5–5.1)
Sodium: 132 mmol/L — ABNORMAL LOW (ref 135–145)

## 2017-08-16 LAB — LEGIONELLA PNEUMOPHILA SEROGP 1 UR AG: L. pneumophila Serogp 1 Ur Ag: NEGATIVE

## 2017-08-16 MED ORDER — PIPERACILLIN-TAZOBACTAM 3.375 G IVPB
3.3750 g | Freq: Three times a day (TID) | INTRAVENOUS | Status: DC
  Filled 2017-08-16: qty 50

## 2017-08-16 MED ORDER — POTASSIUM CHLORIDE CRYS ER 20 MEQ PO TBCR
40.0000 meq | EXTENDED_RELEASE_TABLET | Freq: Once | ORAL | Status: AC
  Administered 2017-08-16: 40 meq via ORAL
  Filled 2017-08-16: qty 2

## 2017-08-16 MED ORDER — SODIUM CHLORIDE 0.9 % IV SOLN: INTRAVENOUS | Status: AC

## 2017-08-16 MED ORDER — SODIUM CHLORIDE 0.9 % IV SOLN
INTRAVENOUS | Status: AC
  Administered 2017-08-17: via INTRAVENOUS

## 2017-08-16 MED ORDER — PIPERACILLIN-TAZOBACTAM 3.375 G IVPB
3.3750 g | Freq: Three times a day (TID) | INTRAVENOUS | Status: DC
  Administered 2017-08-16 – 2017-08-18 (×7): 3.375 g via INTRAVENOUS
  Filled 2017-08-16 (×9): qty 50

## 2017-08-16 NOTE — Progress Notes (Signed)
Occupational Therapy Treatment Patient Details Name: [name] MRN: [id number] DOB: 12-23-1940 Today's Date: 08/16/2017    History of present illness This 59 male admitted on 08/12/17 with falls, and confusion, and febrile.  CT of head shows no acute intracranial abnormality.  MRI of head and spine pending.  Work up ongoing, but initial Dx: AMS with likely undiagnosed dementia, possibly metabolic from acute hyponatremia vs. wernicke's encephalopathy with ataxia.  PMH includes:  ETOH abuse, A-Fib, h/o RA, hypothyroidism, lumbosacral spondylosis with recent epidural spinal injection on 3/4, OA of both knees  with knee injection on 3/2.     OT comments  Pt somewhat better today.  He is able to sustain attention briefly to perform simple grooming tasks, but struggles with more complex activity due to distractiblity   He requires max A, overall for ADLs due to cognitive deficits and impaired balance, and mod A for functional transfers.  He demonstrates significant difficulty with motor planning and execution of movement.  Recommend CIR.   Follow Up Recommendations  CIR;Supervision/Assistance - 24 hour    Equipment Recommendations  3 in 1 bedside commode;Tub/shower seat;Wheelchair (measurements OT)    Recommendations for Other Services      Precautions / Restrictions Precautions Precautions: Fall       Mobility Bed Mobility Overal bed mobility: Needs Assistance Bed Mobility: Supine to Sit;Sit to Supine     Supine to sit: Max assist;HOB elevated Sit to supine: Mod assist   General bed mobility comments: requires increased time.  He requires max A/prompting to move LEs off bed, as he frequently starts activity then abrubtly stops due to distraction   Transfers Overall transfer level: Needs assistance Equipment used: 1 person hand held assist Transfers: Sit to/from [facility] Sit to Stand: Mod assist Stand pivot transfers: Mod assist;+2 safety/equipment        General transfer comment: mod A to move into standing.  Mod A to pivot LEs - demonstrates difficulty motor planning and executing movement.   Leans heavily to Lt as he fatigues     Balance Overall balance assessment: Needs assistance Sitting-balance support: Feet supported Sitting balance-Leahy Scale: Poor Sitting balance - Comments: Requires UE support to maintain static balance    Standing balance support: Single extremity supported Standing balance-Leahy Scale: Poor Standing balance comment: requires mod A with occasional periods of min A.  He leans to Lt as he fatigues                            ADL either performed or assessed with clinical judgement   ADL Overall ADL's : Needs assistance/impaired     Grooming: Wash/dry hands;Wash/dry face;Minimal assistance;Sitting Grooming Details (indicate cue type and reason): assist for thoroughness  Upper Body Bathing: Maximal assistance;Sitting   Lower Body Bathing: Maximal assistance;Sit to/from stand Lower Body Bathing Details (indicate cue type and reason): assist for thoroughness and sequencing          Toilet Transfer: Moderate assistance;+2 for safety/equipment;Stand-pivot;BSC   Toileting- Clothing Manipulation and Hygiene: Maximal assistance;Sit to/from stand       Functional mobility during ADLs: Moderate assistance;+2 for safety/equipment General ADL Comments: Pt requires assist due to impaired cognition and balance.  He demonstrates significant motor planning deficits      Vision       Perception     Praxis      Cognition Arousal/Alertness: Awake/alert Behavior During Therapy: Flat affect Overall Cognitive Status: Impaired/Different from baseline  Area of Impairment: Orientation;Attention;Memory;Following commands;Safety/judgement;Problem solving                 Orientation Level: Disoriented to;Time;Situation;Place Current Attention Level: Focused;Sustained   Following Commands:  Follows one step commands inconsistently;Follows one step commands with increased time(and mod cues ) Safety/Judgement: Decreased awareness of deficits;Decreased awareness of safety   Problem Solving: Decreased initiation;Slow processing;Difficulty sequencing;Requires verbal cues;Requires tactile cues General Comments: Pt demonstrates focused to brief periods of sustained attention.  He will follow one step commands ~ 70% of the time with mod cues.   Responses are delayed         Exercises     Shoulder Instructions       General Comments      Pertinent Vitals/ Pain       Pain Assessment: Faces Faces Pain Scale: No hurt  Home Living                                          Prior Functioning/Environment              Frequency  Min 2X/week        Progress Toward Goals  OT Goals(current goals can now be found in the care plan section)  Progress towards OT goals: Progressing toward goals     Plan Discharge plan remains appropriate    Co-evaluation                 AM-PAC PT "6 Clicks" Daily Activity     Outcome Measure   Help from another person eating meals?: A Little Help from another person taking care of personal grooming?: A Lot Help from another person toileting, which includes using toliet, bedpan, or urinal?: A Lot Help from another person bathing (including washing, rinsing, drying)?: A Lot Help from another person to put on and taking off regular upper body clothing?: A Lot Help from another person to put on and taking off regular lower body clothing?: Total 6 Click Score: 12    End of Session Equipment Utilized During Treatment: Gait belt  OT Visit Diagnosis: Unsteadiness on feet (R26.81);Cognitive communication deficit (R41.841)   Activity Tolerance Patient tolerated treatment well   Patient Left in bed;with call bell/phone within reach;with nursing/sitter in room   Nurse Communication Mobility status        Time:  2440-1027 OT Time Calculation (min): 19 min  Charges: OT General Charges $OT Visit: 1 Visit OT Treatments $Self Care/Home Management : 8-22 mins  [facility], OTR/L [phone]    Lucille Passy M 08/16/2017, 3:47 PM

## 2017-08-16 NOTE — Progress Notes (Signed)
Pt calm and resting now, no distress nor anxiety noted

## 2017-08-16 NOTE — Care Management Important Message (Signed)
Important Message  Patient Details  Name: [name] MRN: [id number] Date of Birth: 1940/11/13   Medicare Important Message Given:  Yes    [name] 08/16/2017, 12:54 PM

## 2017-08-16 NOTE — Progress Notes (Addendum)
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 Since going home 3/17 his balance has worsened, has fallen numerous times, unable to get up from floor numerous times, and increased confusion. -neurological workup has been unremarkable -3/25: fever of102, chest x-ray noted left lower lobe infiltrate, started IV Zosyn, clinically improving -also has had recurrent urinary retention requiring Foley catheter placement -CIR following, NEuro following  Assessment & Plan:  Left lower lobe pneumonia -only symptom originally was weakness and hiccups -Febrile 3/25 AM onwards, repeat chest x-ray 3/25 noted left lower lobe infiltrate and small pleural effusion, that morning had ronchi and cough -Started IV Zosyn for suspected aspiration pneumonia since 3/25 -my suspicion is that he's had indolent/infection for the last week and didn't present with typical infectious symptoms secondary to his relatively immunocompromised state from chronic methotrexate use -blood cx negative x48hours -Resp virus panel is negative, and Urine legionella is negative    Gait disorder/falls/Ataxia -acute onset, significantly worsened since 3/15 -MRI L spine 3/17 without acute findings, multilevel DJD -MRI Brain 3/17  unrevealing except for some atrophy and chronic microvascular changes -Greatly Appreciate Neuro input, -also discussed with Neurosurgery Dr.Cabell 3/24, he reviewed images and felt reasonable to consider MRI T spine to eval for posterior cord/dorsal column lesions, this was completed 3/26 and negative for any posterior cord lesions -PT following -CIR consulted, hopefully CIR if continues to improve over next 48hours -B12 -688, TSH is ok, t4 is not of value while on synthroid replacement -OOB, encourage movement,   Hyponatremia -labs Urine Na 68 and 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 -due to LLL pneumonia -improved with Rx of Pneumonia -check Bmet daily  Urinary retention -likely due to BPH, -per wife long H/o nocturia, uses bathroom 2-3times every night, never seen a Urologist -foley placed due to recurrent retention 3/24 am -voiding trial when more ambulatory -hold off on Flomax at this time due to soft BP and risk of Orthostatic hypotension in elderly, if BP improves will consider -remove foley tomorrow if moves better  Acute Delirium -multifactorial, hyponatremia, pneumonia, hospital induced delirium, also has some atrophy of brain on MRI -improving with Rx of Pneumonia and improvement in sodium -not back to baseline yet  ETOH use -drinks 2-3glasses of wine/day -last drink 3/15, continue Thiamine, MVI, B1 level pending -out of window for withdrawal  P.Afib -in NSR,  continue BB and eliquis -restarted flecainide at lower dose with soft BP  HTN -on ARB and metoprolol, will continue  metoprolol for rate control with holding parameters and stopped ARB  Hypothyroidism -continue synthroid, recent TSH ok  DM type 2 -hold metformin, SSI  Hypocalcemia -was normal on admission, down with hydration -monitor  DVT prophylaxis: on eliquis Code Status: Full COde  Family Communication: Wife [name] at bedside Disposition Plan: Hopefully inpatient rehab-CIR      Consultants:   NEuro  D/w NSG Dr.Cabel   Procedures:   Antimicrobials:    Subjective: -feels better, breathing ok, walked with physical therapy down the hall  Objective: Vitals:   08/16/17 0440 08/16/17 0839 08/16/17 1224 08/16/17 1500  BP: (!) 106/53 121/63 98/72 103/71  Pulse: 76 66 77 75  Resp: 18 18 18 18   Temp: 99.1 F (37.3 C) 99.9 F (37.7 C) 98.3 F (36.8 C) 98.7 F (37.1 C)  TempSrc: Oral Oral Oral Oral  SpO2: 98% 99% 99% 100%  Weight:      Height:        Intake/Output Summary (Last 24 hours) at 08/16/2017 1940 Last data filed at 08/16/2017 0600 Gross per 24 hour  Intake 150 ml  Output 1100 ml  Net -950 ml   Filed Weights   08/12/17 1635 08/12/17 2213  Weight: 71.7 kg (158 lb) 69.4 kg (153 lb)    Examination:  Gen: Awake, Alert, oriented x2 this am, but less in the afternoon HEENT: no JVD Lungs: good air movement, decreased at L base CVS: S1S2/RRR Abd: soft, Non tender, non distended, BS present Extremities: No Cyanosis, Clubbing or edema Skin: no new rashes Foley catheter in situ Data Reviewed:   CBC: Recent Labs  Lab 08/12/17 1801 08/13/17 0502 08/13/17 0859 08/14/17 0447 08/15/17 0415 08/16/17 0810  WBC 9.8 9.9  --  8.6 10.9* 7.5  NEUTROABS 4.9  --   --   --   --   --   HGB 13.8 13.0  --  14.1 13.1 11.1*  HCT 39.2 37.7* 35.5* 40.8 38.0* 32.4*  MCV 96.8 95.9  --  96.5 94.5 93.9  PLT 180 185  --  183 189 161   Basic Metabolic Panel: Recent Labs  Lab 08/13/17 1734 08/14/17 0447 08/14/17 2104 08/15/17 0415 08/16/17 0810  NA 125* 127* 131* 132* 132*  K 3.5 3.6 3.4* 3.7 3.2*  CL 95* 97* 104 105 107  CO2 21* 20* 20* 21* 18*  GLUCOSE 121* 122* 124* 112* 131*  BUN 8 10 16 12 6   CREATININE 0.84 0.90 1.10 0.92 0.76  CALCIUM 8.5* 8.5* 8.3* 8.0* 7.9*   GFR: Estimated Creatinine Clearance: 73.4 mL/min (by C-G formula based on SCr of 0.76 mg/dL). Liver Function Tests: Recent Labs  Lab 08/12/17 1801  AST 45*  ALT 27   ALKPHOS 66  BILITOT 0.6  PROT 6.3*  ALBUMIN 3.1*   No results for input(s): LIPASE, AMYLASE in the last 168 hours. Recent Labs  Lab 08/13/17 1734  AMMONIA 13   Coagulation Profile: No results for input(s): INR, PROTIME in the last 168 hours. Cardiac Enzymes: No results for input(s): CKTOTAL, CKMB, CKMBINDEX, TROPONINI in the last 168 hours. BNP (last 3 results) No results for input(s): PROBNP in the last 8760 hours. HbA1C: No results for input(s): HGBA1C in the last 72 hours. CBG: Recent Labs  Lab 08/15/17 2255 08/16/17 0601 08/16/17 1137 08/16/17 1521 08/16/17 1642  GLUCAP 97 120* 112* 225* 157*   Lipid Profile: No results for input(s): CHOL, HDL, LDLCALC, TRIG, CHOLHDL, LDLDIRECT in the last 72 hours. Thyroid Function Tests: No results for input(s): TSH, T4TOTAL, FREET4, T3FREE, THYROIDAB in the last 72 hours. Anemia Panel: No results for input(s): VITAMINB12, FOLATE, FERRITIN, TIBC, IRON, RETICCTPCT in the last 72 hours. Urine analysis:    Component Value Date/Time   COLORURINE AMBER (A) 08/14/2017 1154   APPEARANCEUR CLEAR 08/14/2017 1154   LABSPEC 1.019 08/14/2017 1154   PHURINE 6.0 08/14/2017 1154   GLUCOSEU NEGATIVE 08/14/2017  [place] (A) 08/14/2017 [place] 08/14/2017 1154   [place] 08/14/2017 1154   PROTEINUR 30 (A) 08/14/2017 1154   NITRITE NEGATIVE 08/14/2017 1154   LEUKOCYTESUR NEGATIVE 08/14/2017 1154   Sepsis Labs: @LABRCNTIP (procalcitonin:4,lacticidven:4)  ) Recent Results (from the past 240 hour(s))  Culture, Urine     Status: None   Collection Time: 08/14/17  8:59 AM  Result Value Ref Range Status   Specimen Description URINE, CATHETERIZED  Final   Special Requests NONE  Final   Culture   Final    NO GROWTH Performed at [facility], [place] [address] [place], [place] [address]    Report Status 08/15/2017 FINAL  Final  Culture, blood (routine x 2)     Status: None (Preliminary result)    Collection Time: 08/14/17 10:11 AM  Result Value Ref Range Status   Specimen Description BLOOD RIGHT HAND  Final   Special Requests   Final    BOTTLES DRAWN AEROBIC ONLY Blood Culture adequate volume   Culture   Final    NO GROWTH 2 DAYS Performed at [facility], [place] [address], [place], [place] [address]    Report Status PENDING  Incomplete  Culture, blood (routine x 2)     Status: None (Preliminary result)   Collection Time: 08/14/17 10:14 AM  Result Value Ref Range Status   Specimen Description BLOOD RIGHT HAND  Final   Special Requests   Final    BOTTLES DRAWN AEROBIC ONLY Blood Culture results may not be optimal due to an inadequate volume of blood received in culture bottles   Culture   Final    NO GROWTH 2 DAYS Performed at [facility], [place] [address], [place], [place] [address]    Report Status PENDING  Incomplete  Respiratory Panel by PCR     Status: None   Collection Time: 08/15/17 12:34 PM  Result Value Ref Range Status   Adenovirus NOT DETECTED NOT DETECTED Final   Coronavirus 229E NOT DETECTED NOT DETECTED Final   Coronavirus HKU1 NOT DETECTED NOT DETECTED Final   Coronavirus NL63 NOT DETECTED NOT DETECTED Final   Coronavirus OC43 NOT DETECTED NOT DETECTED Final   Metapneumovirus NOT DETECTED NOT DETECTED Final   Rhinovirus / Enterovirus NOT DETECTED NOT DETECTED Final   Influenza A NOT DETECTED NOT DETECTED Final   Influenza B NOT DETECTED NOT DETECTED Final   Parainfluenza Virus 1 NOT DETECTED NOT DETECTED Final   Parainfluenza Virus 2 NOT DETECTED NOT DETECTED Final   Parainfluenza Virus 3 NOT DETECTED NOT DETECTED Final   Parainfluenza Virus 4 NOT DETECTED NOT DETECTED Final   Respiratory Syncytial Virus NOT DETECTED NOT DETECTED Final   Bordetella pertussis NOT DETECTED NOT DETECTED Final   Chlamydophila pneumoniae NOT DETECTED NOT DETECTED Final   Mycoplasma pneumoniae NOT DETECTED NOT DETECTED Final    Comment: Performed at [facility], [place] [address], [place], [place] [address]         Radiology Studies: Mr Thoracic Spine Wo Contrast  Result Date: 08/15/2017 CLINICAL DATA:  Initial evaluation for midthoracic spine pain, frequent falls. EXAM: MRI THORACIC SPINE WITHOUT CONTRAST TECHNIQUE: Multiplanar, multisequence MR imaging of the thoracic spine was performed. No intravenous contrast was administered. COMPARISON:  None. FINDINGS: Alignment: Dextroscoliosis with straightening of the midthoracic kyphosis. No listhesis. Vertebrae: Vertebral body heights are maintained without evidence for acute or chronic fracture. Few scattered chronic endplate Schmorl's nodes noted.  Bone marrow signal intensity within normal limits. Benign hemangioma noted within the T12 vertebral body. No other discrete or worrisome osseous lesions. No abnormal marrow edema. Cord: Signal intensity within the thoracic spinal cord is normal. Normal cord morphology and caliber. Conus medullaris terminates at approximately the L1 level. Paraspinal and other soft tissues: Paraspinous soft tissues demonstrate no acute abnormality. Visualized visceral structures within normal limits. 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 Disc levels: T1-2:  Unremarkable. T2-3: Unremarkable. T3-4:  Mild disc bulge.  No stenosis. T4-5:  Unremarkable. T5-6:  Unremarkable. T6-7:  Unremarkable. T7-8: Small central disc protrusion indents the ventral thecal sac. No significant cord deformity or stenosis. T8-9: Intervertebral disc space narrowing with mild diffuse disc bulge and reactive endplate changes. Mild facet hypertrophy. No stenosis. T9-10: Mild facet hypertrophy.  No stenosis. T10-11: Shallow right paracentral disc protrusion indents the right ventral thecal sac. No significant stenosis or cord deformity. T11-12:  Unremarkable. T12-L1:  Unremarkable. IMPRESSION: 1. No acute abnormality within the thoracic spine. Normal MRI appearance of the thoracic spinal cord. 2. Mild multilevel  degenerative spondylolysis as above without significant spinal stenosis. Most notable findings include small disc protrusions at T7-8 and T10-11. 3. Dextroscoliosis with straightening of the normal thoracic kyphosis. Electronically Signed   By: Jeannine Boga M.D.   On: 08/15/2017 22:43        Scheduled Meds: . apixaban  5 mg Oral BID  . flecainide  50 mg Oral Q12H  . folic acid  1 mg Oral Daily  . insulin aspart  0-9 Units Subcutaneous TID WC  . levothyroxine  75 mcg Oral QAC breakfast  . metoprolol tartrate  25 mg Oral BID  . pantoprazole  40 mg Oral Daily  . rosuvastatin  10 mg Oral Daily  . vitamin B-12  1,000 mcg Oral Daily   Continuous Infusions: . piperacillin-tazobactam (ZOSYN)  IV 3.375 g (08/16/17 1632)  . thiamine injection Stopped (08/16/17 1626)     LOS: 4 days    Time spent: 26mn    PDomenic Polite MD [facility] Page via [email or web address], password [email or web address] After 7PM please contact night-coverage  08/16/2017, 7:40 PM

## 2017-08-16 NOTE — Progress Notes (Signed)
Pt confused and disoriented and pulled out his PIV. Noted some blood stains on the gown and top sheet. Pt reoriented and redirected. New PIV inserted and pt cleaned and made comfortable  in bed.. Rn will continue to monitor pt.closely. Wife also at bed side.

## 2017-08-16 NOTE — Progress Notes (Addendum)
Reason for consult:   Subjective: Alert but slightly confused. Remembers providers name but not oriented to place.     ROS: Unable to obtain due to poor mental status  Examination  Vital signs in last 24 hours: Temp:  [98.2 F (36.8 C)-100.4 F (38 C)] 99.9 F (37.7 C) (03/27 0839) Pulse Rate:  [66-91] 66 (03/27 0839) Resp:  [18-20] 18 (03/27 0839) BP: (90-133)/(47-70) 121/63 (03/27 0839) SpO2:  [97 %-100 %] 99 % (03/27 0839)  General: Not in distress, cooperative CVS: pulse-normal rate and rhythm RS: breathing comfortably Extremities: normal   Neuro: MS: Alert, not oriented, follows commands but does not answer questions appropriately  CN: pupils equal and reactive,  EOMI, face symmetric, tongue midline, normal sensation over face Motor: 5/5 strength symmetric in all 4 extremities, no defitics Reflexes: 2+ bilaterally over patella, biceps, plantars: flexor Sensory and Vibration: Both decreased bilateral LEs Coordination: normal Gait: not tested   Basic Metabolic Panel: Recent Labs  Lab 08/13/17 1734 08/14/17 0447 08/14/17 2104 08/15/17 0415 08/16/17 0810  NA 125* 127* 131* 132* 132*  K 3.5 3.6 3.4* 3.7 3.2*  CL 95* 97* 104 105 107  CO2 21* 20* 20* 21* 18*  GLUCOSE 121* 122* 124* 112* 131*  BUN 8 10 16 12 6   CREATININE 0.84 0.90 1.10 0.92 0.76  CALCIUM 8.5* 8.5* 8.3* 8.0* 7.9*    CBC: Recent Labs  Lab 08/12/17 1801 08/13/17 0502 08/13/17 0859 08/14/17 0447 08/15/17 0415 08/16/17 0810  WBC 9.8 9.9  --  8.6 10.9* 7.5  NEUTROABS 4.9  --   --   --   --   --   HGB 13.8 13.0  --  14.1 13.1 11.1*  HCT 39.2 37.7* 35.5* 40.8 38.0* 32.4*  MCV 96.8 95.9  --  96.5 94.5 93.9  PLT 180 185  --  183 189 189     Coagulation Studies: No results for input(s): LABPROT, INR in the last 72 hours.  Imaging  08/15/16 MRI Thoracic Spine: IMPRESSION: 1. No acute abnormality within the thoracic spine. Normal MRI appearance of the thoracic spinal cord. 2. Mild  multilevel degenerative spondylolysis as above without significant spinal stenosis. Most notable findings include small disc protrusions at T7-8 and T10-11. 3. Dextroscoliosis with straightening of the normal thoracic kyphosis.   ASSESSMENT: [name] a 77 y.o.malewithpmhxofchronic alcohol abuse,AFIB on Eliquis, DM, Hypothyroidism, RA, Hxof lumbosacral spondylosis, osteoarthritis of both knees, Hx of recentEpidural spinal injection on 3/4 and Knee injection on3/06/2017. Patient admitted for worsening generalized weakness, ataxic gait and worsening confusion. MRI lumbar spine yesterday without findings to explain his weakness.   IMPRESSION: 1. 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 Fluid restrict, management per primary -- Continue Zosyn per primary for aspiration PNA -- Vitamin B1 pending, however vitamin B12, folate, and TSH within normal limits  -- PT / OT,  CIR on discharge -- Will followclinically   Velna Ochs, M.D. - PGY2 08/16/2017, 10:29 AM    NEUROHOSPITALIST ADDENDUM Seen and examined the patient today. I have reviewed the history, exam and formulated plan as documented above by PAC/Resident. Few editions were made to the note.   Will follow.  Karena Addison [name] MD [facility] [phone]  If 7pm to 7am, please call on call as listed on AMION.

## 2017-08-16 NOTE — Progress Notes (Signed)
Subjective:  Feels weak but states that he is improving.  Objective:  Vital Signs in the last 24 hours: Temp:  [98.2 F (36.8 C)-100.4 F (38 C)] 99.9 F (37.7 C) (03/27 0839) Pulse Rate:  [66-91] 66 (03/27 0839) Resp:  [18-20] 18 (03/27 0839) BP: (90-133)/(47-70) 121/63 (03/27 0839) SpO2:  [97 %-100 %] 99 % (03/27 0839)  Intake/Output from previous day: 03/26 0701 - 03/27 0700 In: 446.3 [I.V.:196.3; IV Piggyback:250] Out: 2050 [Urine:2050]  Physical Exam: Blood pressure 121/63, pulse 66, temperature 99.9 F (37.7 C), temperature source Oral, resp. rate 18, height 5' 7"  (1.702 m), weight 69.4 kg (153 lb), SpO2 99 %.  General appearance: alert, cooperative, appears stated age, fatigued and no distress Eyes: negative findings: lids and lashes normal Neck: no adenopathy, no carotid bruit, no JVD, supple, symmetrical, trachea midline and thyroid not enlarged, symmetric, no tenderness/mass/nodules Neck: JVP - normal, carotids 2+= without bruits Resp: clear to auscultation bilaterally Chest wall: no tenderness Cardio: regular rate and rhythm, S1, S2 normal, no murmur, click, rub or gallop GI: soft, non-tender; bowel sounds normal; no masses,  no organomegaly Extremities: extremities normal, atraumatic, no cyanosis or edema  Lab Results: BMP Recent Labs    08/14/17 0447 08/14/17 2104 08/15/17 0415  NA 127* 131* 132*  K 3.6 3.4* 3.7  CL 97* 104 105  CO2 20* 20* 21*  GLUCOSE 122* 124* 112*  BUN 10 16 12   CREATININE 0.90 1.10 0.92  CALCIUM 8.5* 8.3* 8.0*  GFRNONAA >60 >60 >60  GFRAA >60 >60 >60    CBC Recent Labs  Lab 08/12/17 1801  08/15/17 0415  WBC 9.8   < > 10.9*  RBC 4.05*   < > 4.02*  HGB 13.8   < > 13.1  HCT 39.2   < > 38.0*  PLT 180   < > 189  MCV 96.8   < > 94.5  MCH 34.1*   < > 32.6  MCHC 35.2   < > 34.5  RDW 14.4   < > 13.8  LYMPHSABS 2.2  --   --   MONOABS 2.5*  --   --   EOSABS 0.1  --   --   BASOSABS 0.1  --   --    < > = values in this  interval not displayed.    HEMOGLOBIN A1C Lab Results  Component Value Date   HGBA1C 6.2 (H) 08/13/2017   MPG 131.24 08/13/2017   TSH Recent Labs    08/13/17 0859  TSH 2.228   Hepatic Function Panel Recent Labs    08/05/17 2316 08/12/17 1801  PROT 6.0* 6.3*  ALBUMIN 3.2* 3.1*  AST 56* 45*  ALT 30 27  ALKPHOS 61 66  BILITOT 0.6 0.6    Imaging: Mr Thoracic Spine Wo Contrast  Result Date: 08/15/2017 CLINICAL DATA:  Initial evaluation for midthoracic spine pain, frequent falls. EXAM: MRI THORACIC SPINE WITHOUT CONTRAST TECHNIQUE: Multiplanar, multisequence MR imaging of the thoracic spine was performed. No intravenous contrast was administered. COMPARISON:  None. FINDINGS: Alignment: Dextroscoliosis with straightening of the midthoracic kyphosis. No listhesis. Vertebrae: Vertebral body heights are maintained without evidence for acute or chronic fracture. Few scattered chronic endplate Schmorl's nodes noted. Bone marrow signal intensity within normal limits. Benign hemangioma noted within the T12 vertebral body. No other discrete or worrisome osseous lesions. No abnormal marrow edema. Cord: Signal intensity within the thoracic spinal cord is normal. Normal cord morphology and caliber. Conus medullaris terminates at approximately the L1 level.  Paraspinal and other soft tissues: Paraspinous soft tissues demonstrate no acute abnormality. Visualized visceral structures within normal limits. 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 Disc levels: T1-2:  Unremarkable. T2-3: Unremarkable. T3-4:  Mild disc bulge.  No stenosis. T4-5:  Unremarkable. T5-6:  Unremarkable. T6-7:  Unremarkable. T7-8: Small central disc protrusion indents the ventral thecal sac. No significant cord deformity or stenosis. T8-9: Intervertebral disc space narrowing with mild diffuse disc bulge and reactive endplate changes. Mild facet hypertrophy. No stenosis. T9-10: Mild facet hypertrophy.  No stenosis. T10-11: Shallow right  paracentral disc protrusion indents the right ventral thecal sac. No significant stenosis or cord deformity. T11-12:  Unremarkable. T12-L1:  Unremarkable. IMPRESSION: 1. No acute abnormality within the thoracic spine. Normal MRI appearance of the thoracic spinal cord. 2. Mild multilevel degenerative spondylolysis as above without significant spinal stenosis. Most notable findings include small disc protrusions at T7-8 and T10-11. 3. Dextroscoliosis with straightening of the normal thoracic kyphosis. Electronically Signed   By: Jeannine Boga M.D.   On: 08/15/2017 22:43   Dg Chest Port 1 View  Result Date: 08/14/2017 CLINICAL DATA:  Fever EXAM: PORTABLE CHEST 1 VIEW COMPARISON:  PA and lateral chest x-ray of August 13, 2017 FINDINGS: There is new hazy increased density at the left lung base. There is partial obscuration of the left hemidiaphragm. There is a small left pleural effusion. The right lung is clear. The heart is top-normal to mildly enlarged. The pulmonary vascularity is normal. There is calcification in the wall of the aortic arch. The bony thorax exhibits no acute abnormality. IMPRESSION: Left lower lobe pneumonia and small left pleural effusion. Followup PA and lateral chest X-ray is recommended in 3-4 weeks following trial of antibiotic therapy to ensure resolution and exclude underlying malignancy. Thoracic aortic atherosclerosis. Electronically Signed   By: David  Martinique M.D.   On: 08/14/2017 13:15    Cardiac Studies:  EKG 08/12/2017: Normal sinus rhythm at the rate of 70 bpm, normal axis, low-voltage complexes.  Echocardiogram 05/20/2015: Office Left ventricle cavity is normal in size. Normal global wall motion. Normal diastolic filling pattern. Calculated EF 63%. Trace mitral regurgitation. Trace tricuspid regurgitation. Trace pulmonic regurgitation.  Essentially normal echocardiogram.  Lexiscan myoview stress test 05/08/2015: Office 1. The resting electrocardiogram  demonstrated normal sinus rhythm, normal resting conduction and no resting arrhythmias. Stress EKG is non-diagnostic for ischemia as it a pharmacologic stress using Lexiscan. Stress symptoms included dyspnea. 2. The perfusion imaging study demonstrates mild gut uptake artifact in the inferior wall but no evidence of ischemia or scar. Left ventricle is normal in size in both rest and stress images. Left ventricular systolic function calculated by QGS was 61%. This is a low risk study.   Scheduled Meds: . apixaban  5 mg Oral BID  . flecainide  50 mg Oral Q12H  . folic acid  1 mg Oral Daily  . insulin aspart  0-9 Units Subcutaneous TID WC  . levothyroxine  75 mcg Oral QAC breakfast  . metoprolol tartrate  25 mg Oral BID  . pantoprazole  40 mg Oral Daily  . rosuvastatin  10 mg Oral Daily  . vitamin B-12  1,000 mcg Oral Daily   Continuous Infusions: . sodium chloride 75 mL/hr at 08/16/17 0321  . piperacillin-tazobactam (ZOSYN)  IV 3.375 g (08/16/17 0526)  . thiamine injection Stopped (08/15/17 2318)   PRN Meds:.acetaminophen **OR** acetaminophen, diclofenac sodium, ondansetron **OR** ondansetron (ZOFRAN) IV  ASSESSMENT AND PLAN:  1. Paroxysmal atrial fibrillation, presently maintaining sinus rhythm. CHA2DS2-VASc  Score is 4 with yearly risk of stroke of 4%.  HAS-Bled score is 1 and estimated major bleeding in one year is 1 % 2.  Diabetes mellitus type II controlled without hyperglycemia 3.  Hyponatremia from sepsis and pneumonia 4.  Altered mental status secondary to hyponatremia now improving with hydration and pneumonia therapy. 5.  Pneumonia, community-acquired 6.  Hypertension, essential, BP borderline   Recommendation: Patient will benefit from inpatient physical rehabilitation in view of generalized deconditioning and failure to thrive.  His medical conditions are all acute that occurred in the past 2 weeks and probable explanation is that he is immunocompromised from methotrexate  and did not mount inflammatory response to pneumonia leading to decreased oral intake, dehydration, hyponatremia and altered mental status.  From cardiac standpoint he maintained sinus rhythm on physical exam.  I will recommend telemetry until discharge in view of patient being on antiarrhythmic therapy.  Agree with low dose at 50 mg twice daily of flecainide in view of soft blood pressure.  Discussed with Darryll Capers, MD Primary team and also with Dr. Delice Lesch, MD [facility]). Please call if questions.   Adrian Prows, M.D. 08/16/2017, 8:53 AM [facility] Pager: [phone] Office: [phone] If no answer: [phone]

## 2017-08-16 NOTE — Progress Notes (Signed)
CSW following for discharge plan. CSW alerted by MD to please check in with the family again on SNF options, as family did not seem aware of discharge plan options. CSW met with patient's daughter-in-law at bedside to discuss discharge plan, and how we're hopeful for CIR admission but that may not be possible. CSW discussed SNF as another rehab option, and provided facility list to patient's daughter-in-law. CSW indicated that the patient's wife has the same list, was given the list the other day. CSW explained that they should review facility list and determine preferences for where they would like the patient to admit to SNF, if able.   CSW provided contact information for family to reach out if they have questions or concerns. Patient's family is still very hopeful for CIR admission for patient to return to his prior level of functioning.  CSW will continue to follow.  Laveda Abbe, [place] Clinical Social Worker [phone]

## 2017-08-17 LAB — CBC
HEMATOCRIT: 37.4 % — AB (ref 39.0–52.0)
HEMOGLOBIN: 12.8 g/dL — AB (ref 13.0–17.0)
MCH: 32.5 pg (ref 26.0–34.0)
MCHC: 34.2 g/dL (ref 30.0–36.0)
MCV: 94.9 fL (ref 78.0–100.0)
Platelets: 233 10*3/uL (ref 150–400)
RBC: 3.94 MIL/uL — ABNORMAL LOW (ref 4.22–5.81)
RDW: 13.8 % (ref 11.5–15.5)
WBC: 9.2 10*3/uL (ref 4.0–10.5)

## 2017-08-17 LAB — BASIC METABOLIC PANEL
ANION GAP: 5 (ref 5–15)
BUN: 6 mg/dL (ref 6–20)
CALCIUM: 8.5 mg/dL — AB (ref 8.9–10.3)
CHLORIDE: 110 mmol/L (ref 101–111)
CO2: 22 mmol/L (ref 22–32)
Creatinine, Ser: 0.92 mg/dL (ref 0.61–1.24)
GFR calc non Af Amer: >60 mL/min (ref >60)
Glucose, Bld: 110 mg/dL — ABNORMAL HIGH (ref 65–99)
Potassium: 4.9 mmol/L (ref 3.5–5.1)
SODIUM: 137 mmol/L (ref 135–145)

## 2017-08-17 LAB — GLUCOSE, CAPILLARY
GLUCOSE-CAPILLARY: 137 mg/dL — AB (ref 65–99)
Glucose-Capillary: 94 mg/dL (ref 65–99)
Glucose-Capillary: 101 mg/dL — ABNORMAL HIGH (ref 65–99)
Glucose-Capillary: 158 mg/dL — ABNORMAL HIGH (ref 65–99)
Glucose-Capillary: 192 mg/dL — ABNORMAL HIGH (ref 65–99)

## 2017-08-17 LAB — VITAMIN B1: VITAMIN B1 (THIAMINE): 90.1 nmol/L (ref 66.5–200.0)

## 2017-08-17 LAB — PROCALCITONIN: PROCALCITONIN: 0.25 ng/mL

## 2017-08-17 MED ORDER — SACCHAROMYCES BOULARDII 250 MG PO CAPS
250.0000 mg | ORAL_CAPSULE | Freq: Two times a day (BID) | ORAL | Status: DC
  Administered 2017-08-17 – 2017-08-18 (×2): 250 mg via ORAL
  Filled 2017-08-17 (×2): qty 1

## 2017-08-17 MED ORDER — SODIUM CHLORIDE 0.9 % IV SOLN
INTRAVENOUS | Status: DC
  Administered 2017-08-17: 19:00:00 via INTRAVENOUS

## 2017-08-17 MED ORDER — VITAMIN B-1 100 MG PO TABS
100.0000 mg | ORAL_TABLET | Freq: Every day | ORAL | Status: DC
  Administered 2017-08-18: 100 mg via ORAL
  Filled 2017-08-17: qty 1

## 2017-08-17 NOTE — Progress Notes (Signed)
Pharmacy Antibiotic Note  [name] is a 77 y.o. male admitted on 08/12/2017 with falls, unable to get up from bed and from floor after falls and recent confusion.  He has h/o rheumatoid arthritis, lumbosacral spondylosis, osteoarthritis of both knees, H/o epidural spinal injection on 3/4 and Knee injection 07/22/17. Since going home 3/17 his balance has worsened, has fallen numerous times, unable to get up from floor numerous times, some periodic confusion overnight.   Pharmacy has been consulted for Zosyn dosing for UTI vs aspiration pneumonia. Renal function is stable.  Tmax 100.7, WBC WNL.   Plan: Continue Zosyn EID 3.375gm IV Q8H Pharmacy will sing off and follow peripherally.  Thank you for the consult.  Recommend stop date of 3/31 after 7 days of therapy.  Height: 5' 7"  (170.2 cm) Weight: 153 lb (69.4 kg) IBW/kg (Calculated) : 66.1  Temp (24hrs), Avg:99.3 F (37.4 C), Min:98.3 F (36.8 C), Max:100.7 F (38.2 C)  Recent Labs  Lab 08/13/17 0502  08/14/17 0447 08/14/17 2104 08/15/17 0415 08/16/17 0810 08/17/17 0621  WBC 9.9  --  8.6  --  10.9* 7.5 9.2  CREATININE 0.88   < > 0.90 1.10 0.92 0.76 0.92   < > = values in this interval not displayed.    Estimated Creatinine Clearance: 63.9 mL/min (by C-G formula based on SCr of 0.92 mg/dL).    Allergies  Allergen Reactions  . Hydrocodone Nausea Only     Zosyn 3/25 >>   3/25 BCx x2: ngtd  3/25 UCx: negative 3/26 Resp PCR negative   [name] D. Mina Marble, PharmD, BCPS Pager:  [phone] 08/17/2017, 8:56 AM

## 2017-08-17 NOTE — Discharge Instructions (Signed)

## 2017-08-17 NOTE — Progress Notes (Signed)
Assisted pt up to the bathroom and pt is very unsteady.  Two person assist to the bathroom.  Wife in the room at this time.

## 2017-08-17 NOTE — Progress Notes (Signed)
Inpatient Rehabilitation  Met with patient and spouse at bedside to discuss team's recommendation for IP Rehab.  Shared booklets, insurance verification letter, and answered initial questions.  Plan to follow for timing of medical readiness, insurance authorization, and IP Rehab bed availability.  Hopeful to initiate insurance authorization later today with updated PT note.  Discussed with nurse case manager.  Call if questions.    Carmelia Roller., CCC/SLP Admission Coordinator  [place]  Cell [phone]

## 2017-08-17 NOTE — Progress Notes (Signed)
Physical Therapy Treatment Patient Details Name: [name] MRN: [id number] DOB: [birth year] Today's Date: 08/17/2017    History of Present Illness This 46 male admitted on 08/12/17 with falls, and confusion, and febrile.  CT of head shows no acute intracranial abnormality.  MRI of head and spine pending.  Work up ongoing, but initial Dx: AMS with likely undiagnosed dementia, possibly metabolic from acute hyponatremia vs. wernicke's encephalopathy with ataxia.  PMH includes:  ETOH abuse, A-Fib, h/o RA, hypothyroidism, lumbosacral spondylosis with recent epidural spinal injection on 3/4, OA of both knees  with knee injection on 3/2.      PT Comments    Patient progressing slowly due to continued postural deficits/question perceptual deficits and lateral and posterior balance with significant fear of falling. Feel he can continue to progress with mobility at CIR level rehab to allow d/c home with family assist.   Follow Up Recommendations  CIR;Supervision/Assistance - 24 hour     Equipment Recommendations  Rolling walker with 5" wheels    Recommendations for Other Services       Precautions / Restrictions Precautions Precautions: Fall    Mobility  Bed Mobility               General bed mobility comments: up in chair  Transfers Overall transfer level: Needs assistance Equipment used: Rolling walker (2 wheeled) Transfers: Sit to/from Stand Sit to Stand: Mod assist;+2 safety/equipment         General transfer comment: initially too posterior to stsand, pt cued to reach for feet several times to improve anterior weight shift and improved safety/less assist with sit to stand, needs lifting and lowering help  Ambulation/Gait Ambulation/Gait assistance: Mod assist;+2 safety/equipment Ambulation Distance (Feet): 50 Feet(& 2 x 8' in room) Assistive device: Rolling walker (2 wheeled) Gait Pattern/deviations: Step-to pattern;Decreased stride length;Drifts  right/left;Shuffle;Staggering left     General Gait Details: increased forward and L lateral flexion with ambulation in hallway, able to assist to improve upright if righting head, but reutrns to leaning L due to fear of falling and reaching for wall rail in hallway despite cues to keep hands on walker.  Needs assist to push walker to keep moving forward due to relutant to walk   Stairs            Wheelchair Mobility    Modified Rankin (Stroke Patients Only)       Balance Overall balance assessment: Needs assistance Sitting-balance support: Feet supported Sitting balance-Leahy Scale: Poor Sitting balance - Comments: leans back, can sit with S if hands on chair Postural control: Posterior lean Standing balance support: Bilateral upper extremity supported Standing balance-Leahy Scale: Poor Standing balance comment: min to mod A in standing with UE support, stood to read white board with continued posterior lean and L lateral neck flexion                            Cognition Arousal/Alertness: Awake/alert Behavior During Therapy: Flat affect Overall Cognitive Status: Impaired/Different from baseline Area of Impairment: Attention;Memory;Following commands;Safety/judgement;Problem solving                   Current Attention Level: Sustained Memory: Decreased short-term memory Following Commands: Follows one step commands inconsistently;Follows one step commands with increased time Safety/Judgement: Decreased awareness of deficits;Decreased awareness of safety   Problem Solving: Decreased initiation;Slow processing;Difficulty sequencing;Requires verbal cues;Requires tactile cues General Comments: increased time to respond to cues, fearful of falling and unsafe  despite cues for safety      Exercises General Exercises - Lower Extremity Ankle Circles/Pumps: AROM;10 reps;Seated;Both Long Arc Quad: AROM;5 reps;Seated;Both Other Exercises Other Exercises:  anterior trunk flexion with assist initially, but pt continues to use UE assist despite education/cues so forward flexion reaching to feet several times for increased anterior weight shift    General Comments General comments (skin integrity, edema, etc.): wife in room and reports pt had walked in hall with PT last session; educated in stretch to R lateral neck musculature 2-3 reps 15-20 sec hold      Pertinent Vitals/Pain Faces Pain Scale: No hurt    Home Living                      Prior Function            PT Goals (current goals can now be found in the care plan section) Progress towards PT goals: Progressing toward goals(slowly)    Frequency    Min 3X/week      PT Plan Current plan remains appropriate    Co-evaluation              AM-PAC PT "6 Clicks" Daily Activity  Outcome Measure  Difficulty turning over in bed (including adjusting bedclothes, sheets and blankets)?: Unable Difficulty moving from lying on back to sitting on the side of the bed? : Unable Difficulty sitting down on and standing up from a chair with arms (e.g., wheelchair, bedside commode, etc,.)?: Unable Help needed moving to and from a bed to chair (including a wheelchair)?: A Lot Help needed walking in hospital room?: A Lot Help needed climbing 3-5 steps with a railing? : Total 6 Click Score: 8    End of Session Equipment Utilized During Treatment: Gait belt Activity Tolerance: Patient tolerated treatment well Patient left: with call bell/phone within reach;with family/visitor present;in chair;with chair alarm set   PT Visit Diagnosis: Unsteadiness on feet (R26.81);Other abnormalities of gait and mobility (R26.89);Other symptoms and signs involving the nervous system (R29.898)     Time: 8453-6468 PT Time Calculation (min) (ACUTE ONLY): 35 min  Charges:  $Gait Training: 8-22 mins $Therapeutic Activity: 8-22 mins                    G CodesMagda Kaufman,  [place] [phone] 08/17/2017    [name] 08/17/2017, 2:16 PM

## 2017-08-17 NOTE — Progress Notes (Addendum)
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 Since going home 3/17 his balance has worsened, has fallen numerous times, unable to get up from floor numerous times, and increased confusion. -neurological workup has been unremarkable. -Admitted 3/23 with confusion and hyponatremia, weakness and falls -3/25: fever of 102, chest x-ray noted left lower lobe infiltrate, started IV Zosyn, clinically improving -change to Augmentin at DC for 7-10day course -also has had recurrent urinary retention requiring Foley catheter placement, urine Cx negative -CIR consulted, NEuro following -Na improving, fever improving, mentation slowly improving  Assessment & Plan:  Left lower lobe pneumonia -only symptom originally was weakness and hiccups -Febrile 3/25 AM onwards 2days after admission, repeat chest x-ray 3/25 noted left lower lobe infiltrate and small pleural effusion, that morning had ronchi and cough -Started IV Zosyn for suspected aspiration pneumonia since 3/25 -other possibility is that he's had indolent/infection for the last week and didn't present with typical infectious symptoms secondary to his relatively immunocompromised state from chronic methotrexate use -blood cx negative    -Resp virus panel is negative, and Urine legionella is negative    Gait disorder/falls/Ataxia -acute onset, significantly worsened since 3/15 -MRI L spine 3/17 without acute findings, multilevel DJD -MRI Brain 3/17 unrevealing except for some atrophy and chronic microvascular changes -Greatly Appreciate Neuro input, -also discussed with Neurosurgery [name] 3/24, he reviewed images and felt reasonable to consider MRI T spine to eval for posterior cord/dorsal column lesions, this was completed 3/26 and negative for any posterior cord lesions -per Neuro completed 3days of high dose IV thiamine, now change to 168m daily -PT following -CIR consulted, hopefully CIR if continues to improve over next 48hours -B12 -688, TSH is ok, t4 is not of value while on synthroid replacement -OOB, encourage movement, CIR in 24-48hours  Hyponatremia -labs Urine Na 68 and 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 -due to LLL pneumonia -improved with Rx of Pneumonia -check Bmet daily -Na has normlaized  Urinary retention -likely due to BPH, -per wife long H/o nocturia, uses bathroom 2-3times every night, never seen a Urologist -foley placed due to recurrent retention 3/24 am -voiding trial when more ambulatory -hold off on Flomax at this time due to soft BP and risk of Orthostatic hypotension in elderly, if BP improves will consider -remove foley today, after ambulates with PT, d/w RN to monitor for urination  Acute Delirium -multifactorial, hyponatremia, pneumonia, hospital induced delirium, also has some atrophy of brain on MRI -improving with Rx of Pneumonia and improvement in sodium, mentation much better -not back to baseline yet, hopeful for slow improvement  ETOH use -drinks 2-3glasses of wine/day -last drink 3/15, continue Thiamine, MVI, B1 level low normal -out of window for withdrawal  P.Afib -in NSR,  continue BB and eliquis -restarted flecainide at lower dose with soft BP, d/w [name]  continue flecainide 533m  BID at DC  HTN -on ARB and metoprolol at baseline, will continue  metoprolol for rate control with holding parameters and stopped ARB  Hypothyroidism -continue synthroid, recent TSH ok  DM type 2 -hold metformin, SSI  Hypocalcemia -was normal on admission, down with hydration -stable now  DVT prophylaxis: on eliquis Code Status: Full COde  Family Communication: Wife [name] at bedside Disposition Plan: Hopefully inpatient rehab-CIR in 1-2days     Consultants:   NEuro  D/w NSG Dr.Cabel   Procedures:   Antimicrobials:    Subjective: -walked some   Objective: Vitals:   08/17/17 1035 08/17/17 1120 08/17/17 1148 08/17/17 1608  BP: 115/70 115/70 97/65 118/69  Pulse: 78 78 80 62  Resp: 16   16  Temp: (!) 100.9 F (38.3 C)  99.3 F (37.4 C) 98 F (36.7 C)  TempSrc: Oral   Oral  SpO2: 100%  97% 97%  Weight:      Height:        Intake/Output Summary (Last 24 hours) at 08/17/2017 1817 Last data filed at 08/17/2017 1408 Gross per 24 hour  Intake 848.75 ml  Output 2300 ml  Net -1451.25 ml   Filed Weights   08/12/17 1635 08/12/17 2213  Weight: 71.7 kg (158 lb) 69.4 kg (153 lb)    Examination:  Gen: Awake, Alert, Oriented X to self, able to tell me he's hospital but not [facility], [place], [place], year he told me 45, then said 45 HEENT: PERRLA, no JVD Lungs: decreased at L base, rest clear CVS: S1S2/RRR Abd: soft, Non tender, non distended, BS present Extremities: No edema Skin: scabs on knees, no edema Neuro: higher function, improving, motor 5/5, sensory light touch intact, DTR 2plus  Data Reviewed:   CBC: Recent Labs  Lab 08/12/17 1801 08/13/17 0502 08/13/17 0859 08/14/17 0447 08/15/17 0415 08/16/17 0810 08/17/17 0621  WBC 9.8 9.9  --  8.6 10.9* 7.5 9.2  NEUTROABS 4.9  --   --   --   --   --   --   HGB 13.8 13.0  --  14.1 13.1 11.1* 12.8*  HCT 39.2 37.7* 35.5* 40.8 38.0* 32.4* 37.4*  MCV 96.8 95.9  --  96.5 94.5  93.9 94.9  PLT 180 185  --  183 189 189 149   Basic Metabolic Panel: Recent Labs  Lab 08/14/17 0447 08/14/17 2104 08/15/17 0415 08/16/17 0810 08/17/17 0621  NA 127* 131* 132* 132* 137  K 3.6 3.4* 3.7 3.2* 4.9  CL 97* 104 105 107 110  CO2 20* 20* 21* 18* 22  GLUCOSE 122* 124* 112* 131* 110*  BUN 10 16 12 6 6   CREATININE 0.90 1.10 0.92 0.76 0.92  CALCIUM 8.5* 8.3* 8.0* 7.9* 8.5*   GFR: Estimated Creatinine Clearance: 63.9 mL/min (by C-G formula based on SCr of 0.92 mg/dL). Liver Function Tests: Recent Labs  Lab 08/12/17 1801  AST 45*  ALT 27  ALKPHOS 66  BILITOT 0.6  PROT 6.3*  ALBUMIN 3.1*   No results for input(s): LIPASE, AMYLASE in the last 168 hours. Recent Labs  Lab 08/13/17 1734  AMMONIA 13   Coagulation Profile: No results for input(s): INR, PROTIME in the last 168 hours. Cardiac Enzymes: No results for input(s): CKTOTAL, CKMB, CKMBINDEX, TROPONINI in the last 168 hours. BNP (last 3 results) No results for input(s): PROBNP in the last 8760 hours. HbA1C: No results for input(s): HGBA1C in the last 72 hours. CBG: Recent Labs  Lab 08/16/17 2206 08/17/17 7026 08/17/17 0759  08/17/17 1147 08/17/17 1636  GLUCAP 125* 94 101* 137* 158*   Lipid Profile: No results for input(s): CHOL, HDL, LDLCALC, TRIG, CHOLHDL, LDLDIRECT in the last 72 hours. Thyroid Function Tests: No results for input(s): TSH, T4TOTAL, FREET4, T3FREE, THYROIDAB in the last 72 hours. Anemia Panel: No results for input(s): VITAMINB12, FOLATE, FERRITIN, TIBC, IRON, RETICCTPCT in the last 72 hours. Urine analysis:    Component Value Date/Time   COLORURINE AMBER (A) 08/14/2017 1154   APPEARANCEUR CLEAR 08/14/2017 1154   LABSPEC 1.019 08/14/2017 1154   PHURINE 6.0 08/14/2017 1154   GLUCOSEU NEGATIVE 08/14/2017 1154   HGBUR LARGE (A) 08/14/2017 1154   BILIRUBINUR NEGATIVE 08/14/2017 1154   KETONESUR NEGATIVE 08/14/2017 1154   PROTEINUR 30 (A) 08/14/2017 1154   NITRITE NEGATIVE  08/14/2017 1154   LEUKOCYTESUR NEGATIVE 08/14/2017 1154   Sepsis Labs: @LABRCNTIP (procalcitonin:4,lacticidven:4)  ) Recent Results (from the past 240 hour(s))  Culture, Urine     Status: None   Collection Time: 08/14/17  8:59 AM  Result Value Ref Range Status   Specimen Description URINE, CATHETERIZED  Final   Special Requests NONE  Final   Culture   Final    NO GROWTH Performed at [facility], [place] [address] [place], [place] [address]    Report Status 08/15/2017 FINAL  Final  Culture, blood (routine x 2)     Status: None (Preliminary result)   Collection Time: 08/14/17 10:11 AM  Result Value Ref Range Status   Specimen Description BLOOD RIGHT HAND  Final   Special Requests   Final    BOTTLES DRAWN AEROBIC ONLY Blood Culture adequate volume   Culture   Final    NO GROWTH 3 DAYS Performed at [facility], [place] [address], [place], [place] [address]    Report Status PENDING  Incomplete  Culture, blood (routine x 2)     Status: None (Preliminary result)   Collection Time: 08/14/17 10:14 AM  Result Value Ref Range Status   Specimen Description BLOOD RIGHT HAND  Final   Special Requests   Final    BOTTLES DRAWN AEROBIC ONLY Blood Culture results may not be optimal due to an inadequate volume of blood received in culture bottles   Culture   Final    NO GROWTH 3 DAYS Performed at [facility], [place] [address] [place],  [address]    Report Status PENDING  Incomplete  Respiratory Panel by PCR     Status: None   Collection Time: 08/15/17 12:34 PM  Result Value Ref Range Status   Adenovirus NOT DETECTED NOT DETECTED Final   Coronavirus 229E NOT DETECTED NOT DETECTED Final   Coronavirus HKU1 NOT DETECTED NOT DETECTED Final   Coronavirus NL63 NOT DETECTED NOT DETECTED Final   Coronavirus OC43 NOT DETECTED NOT DETECTED Final   Metapneumovirus NOT DETECTED NOT DETECTED Final   Rhinovirus / Enterovirus NOT DETECTED NOT DETECTED Final   Influenza A NOT  DETECTED NOT DETECTED Final   Influenza B NOT DETECTED NOT DETECTED Final   Parainfluenza Virus 1 NOT DETECTED NOT DETECTED Final   Parainfluenza Virus 2 NOT DETECTED NOT DETECTED Final   Parainfluenza Virus 3 NOT DETECTED NOT DETECTED Final   Parainfluenza Virus 4 NOT DETECTED NOT DETECTED Final   Respiratory Syncytial Virus NOT DETECTED NOT DETECTED Final   Bordetella pertussis NOT DETECTED NOT DETECTED Final   Chlamydophila pneumoniae NOT DETECTED NOT DETECTED Final   Mycoplasma pneumoniae NOT DETECTED NOT DETECTED Final  Comment: Performed at [facility], [place] [address], [place], [place] [address]         Radiology Studies: Mr Thoracic Spine Wo Contrast  Result Date: 08/15/2017 CLINICAL DATA:  Initial evaluation for midthoracic spine pain, frequent falls. EXAM: MRI THORACIC SPINE WITHOUT CONTRAST TECHNIQUE: Multiplanar, multisequence MR imaging of the thoracic spine was performed. No intravenous contrast was administered. COMPARISON:  None. FINDINGS: Alignment: Dextroscoliosis with straightening of the midthoracic kyphosis. No listhesis. Vertebrae: Vertebral body heights are maintained without evidence for acute or chronic fracture. Few scattered chronic endplate Schmorl's nodes noted. Bone marrow signal intensity within normal limits. Benign hemangioma noted within the T12 vertebral body. No other discrete or worrisome osseous lesions. No abnormal marrow edema. Cord: Signal intensity within the thoracic spinal cord is normal. Normal cord morphology and caliber. Conus medullaris terminates at approximately the L1 level. Paraspinal and other soft tissues: Paraspinous soft tissues demonstrate no acute abnormality. Visualized visceral structures within normal limits. 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 Disc levels: T1-2:  Unremarkable. T2-3: Unremarkable. T3-4:  Mild disc bulge.  No stenosis. T4-5:  Unremarkable. T5-6:  Unremarkable. T6-7:  Unremarkable. T7-8: Small central disc  protrusion indents the ventral thecal sac. No significant cord deformity or stenosis. T8-9: Intervertebral disc space narrowing with mild diffuse disc bulge and reactive endplate changes. Mild facet hypertrophy. No stenosis. T9-10: Mild facet hypertrophy.  No stenosis. T10-11: Shallow right paracentral disc protrusion indents the right ventral thecal sac. No significant stenosis or cord deformity. T11-12:  Unremarkable. T12-L1:  Unremarkable. IMPRESSION: 1. No acute abnormality within the thoracic spine. Normal MRI appearance of the thoracic spinal cord. 2. Mild multilevel degenerative spondylolysis as above without significant spinal stenosis. Most notable findings include small disc protrusions at T7-8 and T10-11. 3. Dextroscoliosis with straightening of the normal thoracic kyphosis. Electronically Signed   By: Jeannine Boga M.D.   On: 08/15/2017 22:43        Scheduled Meds: . apixaban  5 mg Oral BID  . flecainide  50 mg Oral Q12H  . folic acid  1 mg Oral Daily  . insulin aspart  0-9 Units Subcutaneous TID WC  . levothyroxine  75 mcg Oral QAC breakfast  . metoprolol tartrate  25 mg Oral BID  . pantoprazole  40 mg Oral Daily  . rosuvastatin  10 mg Oral Daily  . saccharomyces boulardii  250 mg Oral BID  . [START ON 08/18/2017] thiamine  100 mg Oral Daily  . vitamin B-12  1,000 mcg Oral Daily   Continuous Infusions: . sodium chloride    . piperacillin-tazobactam (ZOSYN)  IV 3.375 g (08/17/17 1615)     LOS: 5 days    Time spent: 42mn    PDomenic Polite MD [facility] Page via [email or web address], password [email or web address] After 7PM please contact night-coverage  08/17/2017, 6:17 PM

## 2017-08-17 NOTE — Progress Notes (Signed)
Foley d/ced

## 2017-08-17 NOTE — Progress Notes (Signed)
Pt alert and oriented x2, no complaints of pain or discomfort.  Bed in low position, call bell within reach.  Bed alarms on and functioning.  Assessment done and charted.  Will continue to monitor and do hourly rounding throughout the shift

## 2017-08-17 NOTE — Progress Notes (Addendum)
Subjective: Patient expresses that he is very sleepy today.  He is aware that he is at the hospital, [facility], [place] but stated it was 2017  Exam: Vitals:   08/17/17 0433 08/17/17 0801  BP: (!) 157/72 (!) 150/70  Pulse: 65 72  Resp: 18 18  Temp: 98.9 F (37.2 C) (!) 100.4 F (38 C)  SpO2: 100% 98%    Physical Exam --unchanged General: Not in distress, cooperative CVS: pulse-normal rate and rhythm RS: breathing comfortably Extremities: normal       Neuro:  --Grossly unchanged from yesterday other than his alertness is slightly better. MS: Alert,  oriented to hospital, month, however believes it is 2017, follows commands  CN: pupils equal and reactive, EOMI, face symmetric, tongue midline, normal sensation over face Motor: 5/5 strengthsymmetricin all 4 extremities, no defitics Reflexes: 2+ bilaterally over patella, biceps, plantars: flexor Sensory and Vibration: Both decreased bilateral LEs Coordination: normal Gait: not tested    Medications:  Scheduled: . apixaban  5 mg Oral BID  . flecainide  50 mg Oral Q12H  . folic acid  1 mg Oral Daily  . insulin aspart  0-9 Units Subcutaneous TID WC  . levothyroxine  75 mcg Oral QAC breakfast  . metoprolol tartrate  25 mg Oral BID  . pantoprazole  40 mg Oral Daily  . rosuvastatin  10 mg Oral Daily  . vitamin B-12  1,000 mcg Oral Daily   Continuous: . sodium chloride Stopped (08/17/17 0810)  . piperacillin-tazobactam (ZOSYN)  IV 3.375 g (08/17/17 0527)  . thiamine injection Stopped (08/16/17 2200)   GGE:[id number] **OR** acetaminophen, diclofenac sodium, ondansetron **OR** ondansetron (ZOFRAN) IV  Pertinent Labs/Diagnostics: No significant new labs obtained today  08/15/16 MRI Thoracic Spine: IMPRESSION: 1. No acute abnormality within the thoracic spine. Normal MRI appearance of the thoracic spinal cord. 2. Mild multilevel degenerative spondylolysis as above without significant spinal stenosis. Most notable  findings include small disc protrusions at T7-8 and T10-11. 3. Dextroscoliosis with straightening of the normal thoracic kyphosis.     Russell Quill PA-C [facility] [phone]  Assessment  Mr.[name] a 77 y.o.malewithpmhxofchronic alcohol abuse,AFIB on Eliquis, DM, Hypothyroidism, RA, Hxof lumbosacral spondylosis, osteoarthritis of both knees, Hx of recentEpidural spinal injection on 3/4 and Knee injection on3/06/2017. Patient admitted for worsening generalized weakness, ataxic gait and worsening confusion. MRI lumbar spine  without findings to explain his weakness. B12 ,folate and b1 normal.    impression Unchanged at this point.  Continues to be metabolic encephalopathy likely to multi factorial-acute hyponatremia which has now improved however often times the brain resolution lags behind.  Possible alcohol withdrawal and underlying dementia.  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 Patient slightly more confused today than yesterday. Will be admitted to inpatient rehab, hopefully makes a goof recovery. I reviewed exam and agree with formulated plan documented by [name].  Russell Addison Lexxie Winberg MD [facility] [phone]  If 7pm to 7am, please call on call as listed on AMION.

## 2017-08-18 ENCOUNTER — Inpatient Hospital Stay (HOSPITAL_COMMUNITY)
Admission: RE | Admit: 2017-08-18 | Discharge: 2017-08-26 | DRG: 091 | Disposition: A | Discharge status: Home Health | Payer: Medicare Other | Source: Intra-hospital | Attending: Physical Medicine & Rehabilitation | Admitting: Physical Medicine & Rehabilitation

## 2017-08-18 ENCOUNTER — Encounter (HOSPITAL_COMMUNITY): Payer: Self-pay

## 2017-08-18 DIAGNOSIS — Z7901 Long term (current) use of anticoagulants: Secondary | ICD-10-CM | POA: Diagnosis not present

## 2017-08-18 DIAGNOSIS — R269 Unspecified abnormalities of gait and mobility: Secondary | ICD-10-CM | POA: Diagnosis not present

## 2017-08-18 DIAGNOSIS — E1169 Type 2 diabetes mellitus with other specified complication: Secondary | ICD-10-CM | POA: Diagnosis not present

## 2017-08-18 DIAGNOSIS — E876 Hypokalemia: Secondary | ICD-10-CM | POA: Diagnosis present

## 2017-08-18 DIAGNOSIS — E8809 Other disorders of plasma-protein metabolism, not elsewhere classified: Secondary | ICD-10-CM | POA: Diagnosis present

## 2017-08-18 DIAGNOSIS — Z87891 Personal history of nicotine dependence: Secondary | ICD-10-CM | POA: Diagnosis not present

## 2017-08-18 DIAGNOSIS — R2689 Other abnormalities of gait and mobility: Principal | ICD-10-CM

## 2017-08-18 DIAGNOSIS — G9341 Metabolic encephalopathy: Secondary | ICD-10-CM | POA: Diagnosis present

## 2017-08-18 DIAGNOSIS — I480 Paroxysmal atrial fibrillation: Secondary | ICD-10-CM | POA: Diagnosis present

## 2017-08-18 DIAGNOSIS — R740 Nonspecific elevation of levels of transaminase and lactic acid dehydrogenase [LDH]: Secondary | ICD-10-CM | POA: Diagnosis present

## 2017-08-18 DIAGNOSIS — Z885 Allergy status to narcotic agent status: Secondary | ICD-10-CM | POA: Diagnosis not present

## 2017-08-18 DIAGNOSIS — G9340 Encephalopathy, unspecified: Secondary | ICD-10-CM | POA: Diagnosis not present

## 2017-08-18 DIAGNOSIS — M170 Bilateral primary osteoarthritis of knee: Secondary | ICD-10-CM | POA: Diagnosis present

## 2017-08-18 DIAGNOSIS — I10 Essential (primary) hypertension: Secondary | ICD-10-CM | POA: Diagnosis present

## 2017-08-18 DIAGNOSIS — Z79890 Hormone replacement therapy (postmenopausal): Secondary | ICD-10-CM

## 2017-08-18 DIAGNOSIS — J690 Pneumonitis due to inhalation of food and vomit: Secondary | ICD-10-CM | POA: Diagnosis present

## 2017-08-18 DIAGNOSIS — Z833 Family history of diabetes mellitus: Secondary | ICD-10-CM

## 2017-08-18 DIAGNOSIS — E871 Hypo-osmolality and hyponatremia: Secondary | ICD-10-CM | POA: Diagnosis present

## 2017-08-18 DIAGNOSIS — K219 Gastro-esophageal reflux disease without esophagitis: Secondary | ICD-10-CM | POA: Diagnosis present

## 2017-08-18 DIAGNOSIS — M069 Rheumatoid arthritis, unspecified: Secondary | ICD-10-CM | POA: Diagnosis present

## 2017-08-18 DIAGNOSIS — R4189 Other symptoms and signs involving cognitive functions and awareness: Secondary | ICD-10-CM | POA: Diagnosis present

## 2017-08-18 DIAGNOSIS — Z7984 Long term (current) use of oral hypoglycemic drugs: Secondary | ICD-10-CM

## 2017-08-18 DIAGNOSIS — F1010 Alcohol abuse, uncomplicated: Secondary | ICD-10-CM | POA: Diagnosis present

## 2017-08-18 DIAGNOSIS — N400 Enlarged prostate without lower urinary tract symptoms: Secondary | ICD-10-CM | POA: Diagnosis present

## 2017-08-18 DIAGNOSIS — Z79899 Other long term (current) drug therapy: Secondary | ICD-10-CM

## 2017-08-18 DIAGNOSIS — E1142 Type 2 diabetes mellitus with diabetic polyneuropathy: Secondary | ICD-10-CM | POA: Diagnosis present

## 2017-08-18 DIAGNOSIS — Z09 Encounter for follow-up examination after completed treatment for conditions other than malignant neoplasm: Secondary | ICD-10-CM

## 2017-08-18 DIAGNOSIS — I7389 Other specified peripheral vascular diseases: Secondary | ICD-10-CM | POA: Diagnosis present

## 2017-08-18 DIAGNOSIS — M4726 Other spondylosis with radiculopathy, lumbar region: Secondary | ICD-10-CM | POA: Diagnosis present

## 2017-08-18 DIAGNOSIS — E785 Hyperlipidemia, unspecified: Secondary | ICD-10-CM | POA: Diagnosis present

## 2017-08-18 LAB — CBC
HCT: 33.9 % — ABNORMAL LOW (ref 39.0–52.0)
Hemoglobin: 11.6 g/dL — ABNORMAL LOW (ref 13.0–17.0)
MCH: 32.1 pg (ref 26.0–34.0)
MCHC: 34.2 g/dL (ref 30.0–36.0)
MCV: 93.9 fL (ref 78.0–100.0)
PLATELETS: 218 10*3/uL (ref 150–400)
RBC: 3.61 MIL/uL — ABNORMAL LOW (ref 4.22–5.81)
RDW: 13.7 % (ref 11.5–15.5)
WBC: 8.7 10*3/uL (ref 4.0–10.5)

## 2017-08-18 LAB — BASIC METABOLIC PANEL
ANION GAP: 7 (ref 5–15)
CALCIUM: 8.3 mg/dL — AB (ref 8.9–10.3)
CO2: 19 mmol/L — ABNORMAL LOW (ref 22–32)
Chloride: 107 mmol/L (ref 101–111)
Creatinine, Ser: 0.8 mg/dL (ref 0.61–1.24)
GFR calc Af Amer: >60 mL/min (ref >60)
GLUCOSE: 103 mg/dL — AB (ref 65–99)
Potassium: 3.3 mmol/L — ABNORMAL LOW (ref 3.5–5.1)
SODIUM: 133 mmol/L — AB (ref 135–145)

## 2017-08-18 LAB — GLUCOSE, CAPILLARY
Glucose-Capillary: 111 mg/dL — ABNORMAL HIGH (ref 65–99)
Glucose-Capillary: 132 mg/dL — ABNORMAL HIGH (ref 65–99)
Glucose-Capillary: 111 mg/dL — ABNORMAL HIGH (ref 65–99)
Glucose-Capillary: 148 mg/dL — ABNORMAL HIGH (ref 65–99)
Glucose-Capillary: 164 mg/dL — ABNORMAL HIGH (ref 65–99)

## 2017-08-18 MED ORDER — PANTOPRAZOLE SODIUM 40 MG PO TBEC
40.0000 mg | DELAYED_RELEASE_TABLET | Freq: Every day | ORAL | Status: DC
  Administered 2017-08-19 – 2017-08-26 (×8): 40 mg via ORAL
  Filled 2017-08-18 (×8): qty 1

## 2017-08-18 MED ORDER — PROCHLORPERAZINE MALEATE 5 MG PO TABS: 5.0000 mg | ORAL_TABLET | Freq: Four times a day (QID) | ORAL | Status: DC | PRN

## 2017-08-18 MED ORDER — FOLIC ACID 1 MG PO TABS
1.0000 mg | ORAL_TABLET | Freq: Every day | ORAL | Status: DC
  Administered 2017-08-19 – 2017-08-26 (×8): 1 mg via ORAL
  Filled 2017-08-18 (×8): qty 1

## 2017-08-18 MED ORDER — INSULIN ASPART 100 UNIT/ML ~~LOC~~ SOLN
0.0000 [IU] | Freq: Three times a day (TID) | SUBCUTANEOUS | Status: DC
  Administered 2017-08-18: 1 [IU] via SUBCUTANEOUS
  Administered 2017-08-19 – 2017-08-20 (×2): 3 [IU] via SUBCUTANEOUS
  Administered 2017-08-21: 1 [IU] via SUBCUTANEOUS
  Administered 2017-08-21 – 2017-08-23 (×2): 2 [IU] via SUBCUTANEOUS
  Administered 2017-08-25: 1 [IU] via SUBCUTANEOUS

## 2017-08-18 MED ORDER — IBUPROFEN 200 MG PO TABS: 200.0000 mg | ORAL_TABLET | Freq: Four times a day (QID) | ORAL | Status: DC | PRN

## 2017-08-18 MED ORDER — ACETAMINOPHEN 325 MG PO TABS: 650.0000 mg | ORAL_TABLET | Freq: Four times a day (QID) | ORAL | Status: DC | PRN

## 2017-08-18 MED ORDER — GUAIFENESIN-DM 100-10 MG/5ML PO SYRP: 5.0000 mL | ORAL_SOLUTION | Freq: Four times a day (QID) | ORAL | Status: DC | PRN

## 2017-08-18 MED ORDER — PROCHLORPERAZINE 25 MG RE SUPP: 12.5000 mg | Freq: Four times a day (QID) | RECTAL | Status: DC | PRN

## 2017-08-18 MED ORDER — POLYETHYLENE GLYCOL 3350 17 G PO PACK: 17.0000 g | PACK | Freq: Every day | ORAL | Status: DC | PRN

## 2017-08-18 MED ORDER — THIAMINE HCL 100 MG PO TABS: 100.0000 mg | ORAL_TABLET | Freq: Every day | ORAL | Status: DC

## 2017-08-18 MED ORDER — CYANOCOBALAMIN 1000 MCG PO TABS: 1000.0000 ug | ORAL_TABLET | Freq: Every day | ORAL | Status: DC

## 2017-08-18 MED ORDER — SACCHAROMYCES BOULARDII 250 MG PO CAPS
250.0000 mg | ORAL_CAPSULE | Freq: Two times a day (BID) | ORAL | Status: DC
  Administered 2017-08-18 – 2017-08-26 (×16): 250 mg via ORAL
  Filled 2017-08-18 (×16): qty 1

## 2017-08-18 MED ORDER — ONDANSETRON HCL 4 MG PO TABS: 4.0000 mg | ORAL_TABLET | Freq: Four times a day (QID) | ORAL | Status: DC | PRN

## 2017-08-18 MED ORDER — METOPROLOL TARTRATE 25 MG PO TABS
25.0000 mg | ORAL_TABLET | Freq: Two times a day (BID) | ORAL | Status: DC
Start: 2017-08-18 — End: 2017-08-26
  Administered 2017-08-18 – 2017-08-26 (×16): 25 mg via ORAL
  Filled 2017-08-18 (×16): qty 1

## 2017-08-18 MED ORDER — ACETAMINOPHEN 325 MG PO TABS: 325.0000 mg | ORAL_TABLET | ORAL | Status: DC | PRN

## 2017-08-18 MED ORDER — FLEET ENEMA 7-19 GM/118ML RE ENEM: 1.0000 | ENEMA | Freq: Once | RECTAL | Status: DC | PRN

## 2017-08-18 MED ORDER — SACCHAROMYCES BOULARDII 250 MG PO CAPS: 250.0000 mg | ORAL_CAPSULE | Freq: Two times a day (BID) | ORAL | Status: DC

## 2017-08-18 MED ORDER — PROCHLORPERAZINE EDISYLATE 5 MG/ML IJ SOLN: 5.0000 mg | Freq: Four times a day (QID) | INTRAMUSCULAR | Status: DC | PRN

## 2017-08-18 MED ORDER — BISACODYL 10 MG RE SUPP: 10.0000 mg | Freq: Every day | RECTAL | Status: DC | PRN

## 2017-08-18 MED ORDER — ALUM & MAG HYDROXIDE-SIMETH 200-200-20 MG/5ML PO SUSP: 30.0000 mL | ORAL | Status: DC | PRN

## 2017-08-18 MED ORDER — VITAMIN B-1 100 MG PO TABS
100.0000 mg | ORAL_TABLET | Freq: Every day | ORAL | Status: DC
  Administered 2017-08-19 – 2017-08-26 (×8): 100 mg via ORAL
  Filled 2017-08-18 (×8): qty 1

## 2017-08-18 MED ORDER — ONDANSETRON HCL 4 MG/2ML IJ SOLN: 4.0000 mg | Freq: Four times a day (QID) | INTRAMUSCULAR | Status: DC | PRN

## 2017-08-18 MED ORDER — APIXABAN 5 MG PO TABS
5.0000 mg | ORAL_TABLET | Freq: Two times a day (BID) | ORAL | Status: DC
  Administered 2017-08-18 – 2017-08-26 (×16): 5 mg via ORAL
  Filled 2017-08-18 (×16): qty 1

## 2017-08-18 MED ORDER — AMOXICILLIN-POT CLAVULANATE 875-125 MG PO TABS: 1.0000 | ORAL_TABLET | Freq: Two times a day (BID) | ORAL | Status: DC

## 2017-08-18 MED ORDER — TRAZODONE HCL 50 MG PO TABS
25.0000 mg | ORAL_TABLET | Freq: Every evening | ORAL | Status: DC | PRN
  Administered 2017-08-22 – 2017-08-24 (×3): 50 mg via ORAL
  Filled 2017-08-18 (×3): qty 1

## 2017-08-18 MED ORDER — DICLOFENAC SODIUM 1 % TD GEL
4.0000 g | Freq: Four times a day (QID) | TRANSDERMAL | Status: DC | PRN
  Filled 2017-08-18: qty 100

## 2017-08-18 MED ORDER — ROSUVASTATIN CALCIUM 10 MG PO TABS
10.0000 mg | ORAL_TABLET | Freq: Every day | ORAL | Status: DC
  Administered 2017-08-19 – 2017-08-26 (×8): 10 mg via ORAL
  Filled 2017-08-18 (×8): qty 1

## 2017-08-18 MED ORDER — VITAMIN B-12 1000 MCG PO TABS
1000.0000 ug | ORAL_TABLET | Freq: Every day | ORAL | Status: DC
  Administered 2017-08-19 – 2017-08-26 (×8): 1000 ug via ORAL
  Filled 2017-08-18 (×8): qty 1

## 2017-08-18 MED ORDER — LEVOTHYROXINE SODIUM 75 MCG PO TABS
75.0000 ug | ORAL_TABLET | Freq: Every day | ORAL | Status: DC
  Administered 2017-08-19 – 2017-08-26 (×8): 75 ug via ORAL
  Filled 2017-08-18 (×8): qty 1

## 2017-08-18 MED ORDER — PIPERACILLIN-TAZOBACTAM 3.375 G IVPB
3.3750 g | Freq: Three times a day (TID) | INTRAVENOUS | Status: DC
  Administered 2017-08-18 – 2017-08-21 (×8): 3.375 g via INTRAVENOUS
  Filled 2017-08-18 (×10): qty 50

## 2017-08-18 MED ORDER — DIPHENHYDRAMINE HCL 12.5 MG/5ML PO ELIX: 12.5000 mg | ORAL_SOLUTION | Freq: Four times a day (QID) | ORAL | Status: DC | PRN

## 2017-08-18 MED ORDER — FLECAINIDE ACETATE 50 MG PO TABS: 50.0000 mg | ORAL_TABLET | Freq: Two times a day (BID) | ORAL | Status: DC

## 2017-08-18 MED ORDER — FLECAINIDE ACETATE 50 MG PO TABS
50.0000 mg | ORAL_TABLET | Freq: Two times a day (BID) | ORAL | Status: DC
  Administered 2017-08-18 – 2017-08-26 (×16): 50 mg via ORAL
  Filled 2017-08-18 (×16): qty 1

## 2017-08-18 NOTE — Discharge Summary (Signed)
Physician Discharge Summary  [name] AOZ:[id number] DOB: 06-14-1940 DOA: 08/12/2017  PCP: Merrilee Seashore, MD  Admit date: 08/12/2017 Discharge date: 08/18/2017  Time spent: 45 minutes  Recommendations for Outpatient Follow-up:  1. Discharge to Rehab 2. PCP Dr.Ramachandran in 1 week 3. [facility] in 2-3weeks post DC 4. Continue Augmentin for a few more days for aspiration pneumonia   Discharge Diagnoses:    Falls   L lung pneumonia   SIADH   Primary osteoarthritis of both knees   Atrial fibrillation (HCC)   Rheumatoid arthritis ([place])   Spinal stenosis   Type 2 diabetes mellitus with other specified complication (HCC)   Hyponatremia   Fever   SIADH (syndrome of inappropriate ADH production) (HCC)   PAF (paroxysmal atrial fibrillation) ([place])   Type 2 diabetes mellitus with peripheral neuropathy ([place])   Hypokalemia   Urinary retention   Hypothyroidism   Discharge Condition: improving  Diet recommendation: diabetic/vegetarian  Filed Weights   08/12/17 1635 08/12/17 2213  Weight: 71.7 kg (158 lb) 69.4 kg (153 lb)    History of present illness:  [name] 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 Since going home 3/17 his balance has worsened, has fallen numerous times, unable to get up from floor numerous times, and increased confusion  Hospital Course:  Admitted with falls,  confusion, mild agitation, outside of window for ETOH withdrawal, hyponatremia, 2days later spiked fever of 102, CXR showed LL infiltrate, now improving overall.  Left lower lobe pneumonia -only symptom originally was weakness and hiccups -Febrile 3/25 AM onwards 2days after admission, repeat chest x-ray 3/25 noted left lower lobe infiltrate and small pleural effusion, that morning had ronchi and cough -Started IV Zosyn for suspected aspiration pneumonia since 3/25 -other possibility is that he's had indolent/infection for the last week and didn't present with typical infectious symptoms secondary to his relatively immunocompromised state from chronic methotrexate use -regardless improving now, will change to Augmentin at DC for 3days, fever curve better -blood cx negative  -Resp virus panel is negative, and Urine legionella is negative    Gait disorder/falls/Ataxia -acute onset, significantly worsened since 3/15 -Originally came to ED 3/16-17, MRI L spine 3/17 without acute findings, multilevel DJD, MRI Brain 3/17 unrevealing except for some atrophy and chronic microvascular changes -Neuro consulted, -also discussed with Neurosurgery Dr.Cabell 3/24, he reviewed images and felt reasonable to consider MRI T spine to eval for posterior cord/dorsal column lesions, this was completed 3/26 and negative for any posterior cord lesions -per Neuro completed 3days of high dose IV thiamine, now changed to 169m daily. -now we feel this was just part of his metabolic encephalopathy with brewing infection, hyponatremia etc -PT following -CIR consulted, will be discharged to inpatient rehab -B12 -688, TSH is ok, t4 is not of value while on synthroid replacement  Hyponatremia -labs Urine Na 68 and 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, initially put on fluid restriction, given po lasix single dose without much improvement. -due to LLL pneumonia -improved  with Rx of Pneumonia  Urinary retention -likely  due to BPH, -per wife long H/o nocturia, uses bathroom 2-3times every night, never seen a Urologist -foley placed due to recurrent retention 3/24 am -held off on Flomax due to soft BP and risk of Orthostatic hypotension in elderly -removed foley 3/28, was able to void multiple times after this  Acute Delirium -multifactorial, hyponatremia, pneumonia, hospital induced delirium, also has some atrophy of brain on MRI -improving with Rx of Pneumonia and improvement in sodium, mentation much better -not back to baseline yet, hopeful for continued slow improvement  ETOH use -drinks 2-3glasses of wine/day -last drink 3/15, continue Thiamine, MVI, B1 level low normal -out of window for withdrawal  P.Afib -in NSR, continue BB and eliquis -resumed flecainide at lower dose with soft BP, d/w Dr.Ganji continue flecainide 14m BID at DC  HTN -on ARB and metoprolol at baseline, will continue metoprolol for rate control with holding parameters and stopped ARB  Hypothyroidism -continue synthroid, recent TSH ok  DM type 2 -SSI used inpatient, metformin resumed at DC    Discharge Exam: Vitals:   08/18/17 0027 08/18/17 0430  BP: 124/68 124/62  Pulse: (!) 59 60  Resp: 18 18  Temp: 100 F (37.8 C) 98.9 F (37.2 C)  SpO2: 100% 96%    General: AAOx2, cognition improving Cardiovascular: S1S2/RRR Respiratory: CTAB  Discharge Instructions   Discharge Instructions    Ambulatory referral to Neurology   Complete by:  As directed    An appointment is requested in approximately: Neurocognitive Testing     Allergies as of 08/18/2017      Reactions   Hydrocodone Nausea Only      Medication List    STOP taking these medications   irbesartan 150 MG tablet Commonly known as:  AVAPRO   methotrexate 1 g injection Commonly known as:  50 mg/ml     TAKE these medications   acetaminophen 325 MG tablet Commonly known as:  TYLENOL Take 2 tablets (650 mg total) by mouth every 6  (six) hours as needed for mild pain (or Fever >/= 101).   amoxicillin-clavulanate 875-125 MG tablet Commonly known as:  AUGMENTIN Take 1 tablet by mouth 2 (two) times daily. For 3days   cyanocobalamin 1000 MCG tablet Take 1 tablet (1,000 mcg total) by mouth daily.   diclofenac sodium 1 % Gel Commonly known as:  VOLTAREN Apply topically 4 (four) times daily.   ELIQUIS 5 MG Tabs tablet Generic drug:  apixaban Take 5 mg by mouth 2 (two) times daily.   esomeprazole 40 MG capsule Commonly known as:  NEXIUM Take 40 mg by mouth daily after supper.   flecainide 50 MG tablet Commonly known as:  TAMBOCOR Take 1 tablet (50 mg total) by mouth every 12 (twelve) hours.   folic acid 1 MG tablet Commonly known as:  FOLVITE Take 1 mg by mouth daily.   levothyroxine 75 MCG tablet Commonly known as:  SYNTHROID, LEVOTHROID Take 75 mcg by mouth daily before breakfast.   metFORMIN 500 MG 24 hr tablet Commonly known as:  GLUCOPHAGE-XR Take 500 mg by mouth 2 (two) times daily.   metoprolol tartrate 25 MG tablet Commonly known as:  LOPRESSOR Take 1 tablet (25 mg total) by mouth 2 (two) times daily.   rosuvastatin 10 MG tablet Commonly known as:  CRESTOR Take 10 mg by mouth daily.   saccharomyces boulardii 250 MG capsule Commonly known as:  FLORASTOR Take 1 capsule (250 mg total) by mouth 2 (  two) times daily. For 3days   thiamine 100 MG tablet Take 1 tablet (100 mg total) by mouth daily.      Allergies  Allergen Reactions  . Hydrocodone Nausea Only   Follow-up Information    [facility]. Schedule an appointment as soon as possible for a visit in 1 week(s).   Specialty:  Neurology Why:  Neurocognitive Testing Contact information: [address] [place] [phone]           The results of significant diagnostics from this hospitalization (including imaging, microbiology, ancillary and laboratory) are listed below  for reference.    Significant Diagnostic Studies: Dg Chest 2 View  Result Date: 08/13/2017 CLINICAL DATA:  SIADH EXAM: CHEST - 2 VIEW COMPARISON:  01/16/2016 FINDINGS: The heart size and mediastinal contours are within normal limits. Both lungs are clear. The visualized skeletal structures are unremarkable. IMPRESSION: No active cardiopulmonary disease. Electronically Signed   By: Inez Catalina M.D.   On: 08/13/2017 17:55   Ct Head Wo Contrast  Result Date: 08/12/2017 CLINICAL DATA:  Altered level of consciousness EXAM: CT HEAD WITHOUT CONTRAST TECHNIQUE: Contiguous axial images were obtained from the base of the skull through the vertex without intravenous contrast. COMPARISON:  CT 08/05/2017, MRI 08/06/2017 FINDINGS: BRAIN: There is sulcal and ventricular prominence consistent with superficial and central atrophy. No intraparenchymal hemorrhage, mass effect nor midline shift. Periventricular and subcortical white matter hypodensities consistent with chronic small vessel ischemic disease are identified. No acute large vascular territory infarcts. No abnormal extra-axial fluid collections. Basal cisterns and fourth ventricle are not effaced and midline. VASCULAR: Moderate calcific atherosclerosis of the carotid siphons. SKULL: No skull fracture. No significant scalp soft tissue swelling. SINUSES/ORBITS: The mastoid air-cells are clear. The included paranasal sinuses are well-aerated.The included ocular globes and orbital contents are non-suspicious. OTHER: None. 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 No acute intracranial abnormality. Cerebral atrophy. Electronically Signed   By: Ashley Royalty M.D.   On: 08/12/2017 19:31   Ct Head Wo Contrast  Result Date: 08/06/2017 CLINICAL DATA:  Muscle weakness.  Fall. EXAM: CT HEAD WITHOUT CONTRAST TECHNIQUE: Contiguous axial images were obtained from the base of the skull through the vertex without  intravenous contrast. COMPARISON:  03/16/2004 head CT. FINDINGS: Brain: No evidence of parenchymal hemorrhage or extra-axial fluid collection. No mass lesion, mass effect, or midline shift. No CT evidence of acute infarction. Generalized cerebral volume loss. Nonspecific mild subcortical and periventricular white matter hypodensity, most in keeping with chronic small vessel ischemic change. No ventriculomegaly. Vascular: No acute abnormality. Skull: No evidence of calvarial fracture. Sinuses/Orbits: The visualized paranasal sinuses are essentially clear. Other:  The mastoid air cells are unopacified. IMPRESSION: 1. No evidence of acute intracranial abnormality. No evidence of calvarial fracture. 2. Generalized cerebral volume loss and mild chronic small vessel ischemic changes in the cerebral white matter. Electronically Signed   By: Ilona Sorrel M.D.   On: 08/06/2017 00:29   Mr Brain Wo Contrast  Result Date: 08/06/2017 CLINICAL DATA:  Difficulty walking.  Fall. EXAM: MRI HEAD WITHOUT CONTRAST TECHNIQUE: Multiplanar, multiecho pulse sequences of the brain and surrounding structures were obtained without intravenous contrast. COMPARISON:  Head CT 08/05/2017 FINDINGS: Brain: The midline structures are normal. There is no acute infarct or acute hemorrhage. No mass lesion, hydrocephalus, dural abnormality or extra-axial collection. Early confluent hyperintense T2-weighted signal of the periventricular and deep white matter, most commonly due to chronic ischemic microangiopathy. No age-advanced  or lobar predominant atrophy. No chronic microhemorrhage or superficial siderosis. Vascular: Major intracranial arterial and venous sinus flow voids are preserved. Skull and upper cervical spine: The visualized skull base, calvarium, upper cervical spine and extracranial soft tissues are normal. Sinuses/Orbits: No fluid levels or advanced mucosal thickening. No mastoid or middle ear effusion. Normal orbits. IMPRESSION:  Chronic ischemic microangiopathy without acute intracranial abnormality. Electronically Signed   By: Ulyses Jarred M.D.   On: 08/06/2017 02:55   Mr Thoracic Spine Wo Contrast  Result Date: 08/15/2017 CLINICAL DATA:  Initial evaluation for midthoracic spine pain, frequent falls. EXAM: MRI THORACIC SPINE WITHOUT CONTRAST TECHNIQUE: Multiplanar, multisequence MR imaging of the thoracic spine was performed. No intravenous contrast was administered. COMPARISON:  None. FINDINGS: Alignment: Dextroscoliosis with straightening of the midthoracic kyphosis. No listhesis. Vertebrae: Vertebral body heights are maintained without evidence for acute or chronic fracture. Few scattered chronic endplate Schmorl's nodes noted. Bone marrow signal intensity within normal limits. Benign hemangioma noted within the T12 vertebral body. No other discrete or worrisome osseous lesions. No abnormal marrow edema. Cord: Signal intensity within the thoracic spinal cord is normal. Normal cord morphology and caliber. Conus medullaris terminates at approximately the L1 level. Paraspinal and other soft tissues: Paraspinous soft tissues demonstrate no acute abnormality. Visualized visceral structures within normal limits. 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 Disc levels: T1-2:  Unremarkable. T2-3: Unremarkable. T3-4:  Mild disc bulge.  No stenosis. T4-5:  Unremarkable. T5-6:  Unremarkable. T6-7:  Unremarkable. T7-8: Small central disc protrusion indents the ventral thecal sac. No significant cord deformity or stenosis. T8-9: Intervertebral disc space narrowing with mild diffuse disc bulge and reactive endplate changes. Mild facet hypertrophy. No stenosis. T9-10: Mild facet hypertrophy.  No stenosis. T10-11: Shallow right paracentral disc protrusion indents the right ventral thecal sac. No significant stenosis or cord deformity. T11-12:  Unremarkable. T12-L1:  Unremarkable. IMPRESSION: 1. No acute abnormality within the thoracic spine. Normal MRI  appearance of the thoracic spinal cord. 2. Mild multilevel degenerative spondylolysis as above without significant spinal stenosis. Most notable findings include small disc protrusions at T7-8 and T10-11. 3. Dextroscoliosis with straightening of the normal thoracic kyphosis. Electronically Signed   By: Jeannine Boga M.D.   On: 08/15/2017 22:43   Mr Lumbar Spine W Wo Contrast  Result Date: 08/06/2017 CLINICAL DATA:  Difficulty walking.  Muscle weakness. EXAM: MRI LUMBAR SPINE WITHOUT AND WITH CONTRAST TECHNIQUE: Multiplanar and multiecho pulse sequences of the lumbar spine were obtained without and with intravenous contrast. CONTRAST:  64m MULTIHANCE GADOBENATE DIMEGLUMINE 529 MG/ML IV SOLN COMPARISON:  Lumbar spine MRI 06/21/2013 FINDINGS: Segmentation: Rudimentary L5-S1 disc space. Numbering scheme is preserved from the MRI of 06/21/2013. Alignment:  Grade 1 retrolisthesis at L2-L3. Vertebrae:  No fracture, evidence of discitis, or bone lesion. Conus medullaris and cauda equina: Conus extends to the L1 level. Conus and cauda equina appear normal. Paraspinal and other soft tissues: 4 cm left renal cyst. Disc levels: T10-T11: Imaged only in the sagittal plane.  Normal. T11-T12: Normal. T12-L1: Normal disc space and facets. No spinal canal or neuroforaminal stenosis. L1-L2: Small left subarticular disc protrusion.  No stenosis. L2-L3: Disc space narrowing with mild diffuse bulge. Mild narrowing of the right lateral recess. Moderate right neural foraminal stenosis due to combination of disc and endplate osteophyte. Findings at this level are unchanged. L3-L4: Disc bulge without spinal canal stenosis. Moderate left neural foraminal stenosis is unchanged. L4-L5: Mild disc bulge. No spinal canal stenosis. Mild left foraminal stenosis, unchanged. L5-S1: Normal  disc space and facets. No spinal canal or neuroforaminal stenosis. Visualized sacrum: Normal. IMPRESSION: 1. No acute abnormality of the lumbar spine. 2.  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 3. No spinal canal stenosis. Electronically Signed   By: Ulyses Jarred M.D.   On: 08/06/2017 03:36   Dg Chest Port 1 View  Result Date: 08/14/2017 CLINICAL DATA:  Fever EXAM: PORTABLE CHEST 1 VIEW COMPARISON:  PA and lateral chest x-ray of August 13, 2017 FINDINGS: There is new hazy increased density at the left lung base. There is partial obscuration of the left hemidiaphragm. There is a small left pleural effusion. The right lung is clear. The heart is top-normal to mildly enlarged. The pulmonary vascularity is normal. There is calcification in the wall of the aortic arch. The bony thorax exhibits no acute abnormality. IMPRESSION: Left lower lobe pneumonia and small left pleural effusion. Followup PA and lateral chest X-ray is recommended in 3-4 weeks following trial of antibiotic therapy to ensure resolution and exclude underlying malignancy. Thoracic aortic atherosclerosis. Electronically Signed   By: David  Martinique M.D.   On: 08/14/2017 13:15   Dg Inject Diag/thera/inc Needle/cath/plc Epi/lumb/sac W/img  Result Date: 07/24/2017 CLINICAL DATA:  Spondylosis without myelopathy. Good response to previous injections but with recurrence of symptoms. Right low back pain primarily. FLUOROSCOPY TIME:  0 minutes 29 seconds. 20.46 micro gray meter squared PROCEDURE: The procedure, risks, benefits, and alternatives were explained to the patient. Questions regarding the procedure were encouraged and answered. The patient understands and consents to the procedure. LUMBAR EPIDURAL INJECTION: An interlaminar approach was performed on the right at L3-4. The overlying skin was cleansed and anesthetized. A 20 gauge epidural needle was advanced using loss-of-resistance technique. DIAGNOSTIC EPIDURAL INJECTION: Injection of Isovue-M 200 shows a good epidural pattern with spread above and below  the level of needle placement, primarily on the right. No vascular opacification is seen. THERAPEUTIC EPIDURAL INJECTION: One hundred twenty mg of Depo-Medrol mixed with 2 cc 1% lidocaine were instilled. The procedure was well-tolerated, and the patient was discharged thirty minutes following the injection in good condition. COMPLICATIONS: None IMPRESSION: Technically successful epidural injection on the right L3-4. Electronically Signed   By: Nelson Chimes M.D.   On: 07/24/2017 08:18    Microbiology: Recent Results (from the past 240 hour(s))  Culture, Urine     Status: None   Collection Time: 08/14/17  8:59 AM  Result Value Ref Range Status   Specimen Description URINE, CATHETERIZED  Final   Special Requests NONE  Final   Culture   Final    NO GROWTH Performed at [facility], [address] [place], [place] [address]    Report Status 08/15/2017 FINAL  Final  Culture, blood (routine x 2)     Status: None (Preliminary result)   Collection Time: 08/14/17 10:11 AM  Result Value Ref Range Status   Specimen Description BLOOD RIGHT HAND  Final   Special Requests   Final    BOTTLES DRAWN AEROBIC ONLY Blood Culture adequate volume   Culture   Final    NO GROWTH 3 DAYS Performed at [facility], [place] [address] [place], [place] [address]    Report Status PENDING  Incomplete  Culture, blood (routine x 2)     Status: None (Preliminary result)   Collection Time: 08/14/17 10:14 AM  Result Value Ref Range Status   Specimen Description BLOOD RIGHT  HAND  Final   Special Requests   Final    BOTTLES DRAWN AEROBIC ONLY Blood Culture results may not be optimal due to an inadequate volume of blood received in culture bottles   Culture   Final    NO GROWTH 3 DAYS Performed at [facility], [place] [address], [place], [place] [address]    Report Status PENDING  Incomplete  Respiratory Panel by PCR     Status: None   Collection Time: 08/15/17 12:34 PM  Result Value Ref Range Status    Adenovirus NOT DETECTED NOT DETECTED Final   Coronavirus 229E NOT DETECTED NOT DETECTED Final   Coronavirus HKU1 NOT DETECTED NOT DETECTED Final   Coronavirus NL63 NOT DETECTED NOT DETECTED Final   Coronavirus OC43 NOT DETECTED NOT DETECTED Final   Metapneumovirus NOT DETECTED NOT DETECTED Final   Rhinovirus / Enterovirus NOT DETECTED NOT DETECTED Final   Influenza A NOT DETECTED NOT DETECTED Final   Influenza B NOT DETECTED NOT DETECTED Final   Parainfluenza Virus 1 NOT DETECTED NOT DETECTED Final   Parainfluenza Virus 2 NOT DETECTED NOT DETECTED Final   Parainfluenza Virus 3 NOT DETECTED NOT DETECTED Final   Parainfluenza Virus 4 NOT DETECTED NOT DETECTED Final   Respiratory Syncytial Virus NOT DETECTED NOT DETECTED Final   Bordetella pertussis NOT DETECTED NOT DETECTED Final   Chlamydophila pneumoniae NOT DETECTED NOT DETECTED Final   Mycoplasma pneumoniae NOT DETECTED NOT DETECTED Final    Comment: Performed at [facility], [place] [address] [place], [place] [address]     Labs: Basic Metabolic Panel: Recent Labs  Lab 08/14/17 [phone] 08/14/17 2104 08/15/17 0415 08/16/17 0810 08/17/17 0621  NA 127* 131* 132* 132* 137  K 3.6 3.4* 3.7 3.2* 4.9  CL 97* 104 105 107 110  CO2 20* 20* 21* 18* 22  GLUCOSE 122* 124* 112* 131* 110*  BUN 10 16 12 6 6   CREATININE 0.90 1.10 0.92 0.76 0.92  CALCIUM 8.5* 8.3* 8.0* 7.9* 8.5*   Liver Function Tests: Recent Labs  Lab 08/12/17 1801  AST 45*  ALT 27  ALKPHOS 66  BILITOT 0.6  PROT 6.3*  ALBUMIN 3.1*   No results for input(s): LIPASE, AMYLASE in the last 168 hours. Recent Labs  Lab 08/13/17 1734  AMMONIA 13   CBC: Recent Labs  Lab 08/12/17 1801 08/13/17 0502 08/13/17 0859 08/14/17 0447 08/15/17 0415 08/16/17 0810 08/17/17 0621  WBC 9.8 9.9  --  8.6 10.9* 7.5 9.2  NEUTROABS 4.9  --   --   --   --   --   --   HGB 13.8 13.0  --  14.1 13.1 11.1* 12.8*  HCT 39.2 37.7* 35.5* 40.8 38.0* 32.4* 37.4*  MCV 96.8 95.9  --   96.5 94.5 93.9 94.9  PLT 180 185  --  183 189 189 233   Cardiac Enzymes: No results for input(s): CKTOTAL, CKMB, CKMBINDEX, TROPONINI in the last 168 hours. BNP: BNP (last 3 results) No results for input(s): BNP in the last 8760 hours.  ProBNP (last 3 results) No results for input(s): PROBNP in the last 8760 hours.  CBG: Recent Labs  Lab 08/17/17 0759 08/17/17 1147 08/17/17 1636 08/17/17 2120 08/18/17 0615  GLUCAP 101* 137* 158* 192* 111*       Signed: Costin M. Cruzita Lederer, MD [facility] [phone]  If 7PM-7AM, please contact night-coverage [email or web address] Password [email or web address]   Domenic Polite MD.  [facility] 08/18/2017, 7:18 AM

## 2017-08-18 NOTE — Progress Notes (Signed)
Pt arrived to unit with spouse and son. Oriented to unit including therapy schedule and fall prevention program. Verbalized understanding of needing to call for assistance. Resting comfortably with call bell within reach. Claude Manges, LPN

## 2017-08-18 NOTE — Care Management Note (Signed)
Case Management Note  Patient Details  Name: [name] MRN: [id number] Date of Birth: [birth year]  Subjective/Objective:                    Action/Plan: Pt discharging to CIR today. CM signing off.   Expected Discharge Date:                  Expected Discharge Plan:  IP Rehab Facility  In-House Referral:  Clinical Social Work  Discharge planning Services  CM Consult  Post Acute Care Choice:    Choice offered to:     DME Arranged:    DME Agency:     HH Arranged:    [place] Agency:     Status of Service:  Completed, signed off  If discussed at [employer], dates discussed:    Additional Comments:  Pollie Friar, RN 08/18/2017, 2:15 PM

## 2017-08-18 NOTE — Progress Notes (Signed)
Inpatient Rehabilitation  I have authorization for an IP Rehab admission, medical clearance, and a bed to offer today.  Notified spouse via phone and plan to meet with her and patient at 3:30 today.  Will plan to bring to IP Rehab.  Discussed with team; call if questions.   Carmelia Roller., CCC/SLP Admission Coordinator  [place]  Cell [phone]

## 2017-08-18 NOTE — Progress Notes (Signed)
Pt discharge education and instructions completed. Pt discharge to CIR and report called off to [facility], nurse on 4w. Pt IV SL; pt transported off unit via wheelchair with family and belongings to the side. Delia Heady RN

## 2017-08-18 NOTE — PMR Pre-admission (Signed)
PMR Admission Coordinator Pre-Admission Assessment  Patient: [name] is an 77 y.o., male MRN: [id number] DOB: July 12, 1940 Height: 5' 7"  (170.2 cm) Weight: 69.4 kg (153 lb)              Insurance Information HMO:     PPO:      PCP:      IPA:      80/20:      OTHER:  PRIMARY: U[employer] Medicare       Policy#: [id number]      Subscriber: Self CM Name: Vevelyn Royals       Phone#: [phone]     Fax#: [phone] Pre-Cert#: [id number] for initial 7 days 08/18/17- 08/24/17 and follow up updates will be requested via fax as needed    Employer:  Benefits:  Phone #: [phone]     Name: Verified online at [email or web address] Eff. Date: 05/23/17     Deduct: $0      Out of Pocket Max: $4000      Life Max: N/A CIR: $160 a day, days 1-10; $0 a day, days 11+      SNF: $0 a day, days 1-20; $50 a day, days 21-100 Outpatient: Necessity      Co-Pay: $20 per visit  Home Health: Necessity, 100%      Co-Pay: $0 DME: 80%     Co-Pay: 20% Providers: In-network   SECONDARY: None       Medicaid Application Date:       Case Manager:  Disability Application Date:       Case Worker:   Emergency Facilities manager Information    Name Relation Home Work Mobile   [place] Spouse [phone]  [phone]   Sharee Holster Son   [phone]     Current Medical History  Patient Admitting Diagnosis: Metabolic encephalopathy   History of Present Illness: [name] a 76 y.o.malewith history of PAF, T2DM with neuropathy, RA, lumbar spondylosiswith radiculopathys/p ESI 3/4 and OA B-knees, falls with BLE weakness, difficulty walking and had negative MRI brain/spine on ED visit 3/17. He was discharged to home but continued to worsen with onset of sundowning and was admitted 08/11/17 for work up. Family reported gradual memory decline which has been worse in the past few weeks to months. CT head reviewed, unremarkable for acute intracranial process. Per report,chronic microangiopathic ischemic  changes of periventricular and subcortical white matter with cerebral atrophy. He was noted to be hyponatremic with Na-126. CXR negative. UDS negative. Respiratory panel negative. Patient noted to be somnolent with significant cognitive deficits.   Neurology consulted and felt that patient with metabolic encephalopathy due to acute hyponatremia,possible ETOH withdrawal v/s Wernicke's encephalopathy v/s underlying dementia or sensory ataxia. He has history of daily alcohol use and was started on IV thiamine. He developed fever of 102 on 3/25, was pan cultured and was started on IV Zosyn for likely aspiration LLL PNA. Foley placed for urinary retention. MRI thoracic spine showed mild degenerative spondylosis but no acute abnormality.Therapy ongoing and CIR recommended due to functional decline and patient admitted 08/18/17.      Past Medical History  Past Medical History:  Diagnosis Date  . DM (diabetes mellitus) ([place])   . GERD (gastroesophageal reflux disease)   . Hyperlipidemia   . Hypertension   . 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 Osteoarthritis   . Osteopenia   . Rheumatoid arthritis ([place])   . Thyroid disease     Family History  family history includes CAD in his brother;  Diabetes Mellitus I in his father and mother.  Prior Rehab/Hospitalizations:  Has the patient had major surgery during 100 days prior to admission? Yes  Current Medications   Current Facility-Administered Medications:  .  acetaminophen (TYLENOL) tablet 650 mg, 650 mg, Oral, Q6H PRN, 650 mg at 08/18/17 0910 **OR** acetaminophen (TYLENOL) suppository 650 mg, 650 mg, Rectal, Q6H PRN, Domenic Polite, MD, 650 mg at 08/14/17 1601 .  apixaban (ELIQUIS) tablet 5 mg, 5 mg, Oral, BID, Domenic Polite, MD, 5 mg at 08/18/17 0909 .  diclofenac sodium (VOLTAREN) 1 % transdermal gel 4 g, 4 g, Topical, QID PRN, Domenic Polite, MD .  flecainide [facility]) tablet 50 mg, 50 mg, Oral, Q12H, Domenic Polite, MD, 50 mg at 08/18/17  0910 .  folic acid (FOLVITE) tablet 1 mg, 1 mg, Oral, Daily, Domenic Polite, MD, 1 mg at 08/18/17 0909 .  ibuprofen (ADVIL,MOTRIN) tablet 200 mg, 200 mg, Oral, Q6H PRN, Cruzita Lederer, Costin M, MD .  insulin aspart (novoLOG) injection 0-9 Units, 0-9 Units, Subcutaneous, TID WC, Domenic Polite, MD, 1 Units at 08/18/17 1325 .  levothyroxine (SYNTHROID, LEVOTHROID) tablet 75 mcg, 75 mcg, Oral, QAC breakfast, Domenic Polite, MD, 75 mcg at 08/18/17 0711 .  metoprolol tartrate (LOPRESSOR) tablet 25 mg, 25 mg, Oral, BID, Domenic Polite, MD, 25 mg at 08/18/17 0910 .  ondansetron (ZOFRAN) tablet 4 mg, 4 mg, Oral, Q6H PRN **OR** ondansetron (ZOFRAN) injection 4 mg, 4 mg, Intravenous, Q6H PRN, Domenic Polite, MD .  pantoprazole (PROTONIX) EC tablet 40 mg, 40 mg, Oral, Daily, Domenic Polite, MD, 40 mg at 08/18/17 0909 .  piperacillin-tazobactam (ZOSYN) IVPB 3.375 g, 3.375 g, Intravenous, Q8H, Domenic Polite, MD, Last Rate: 12.5 mL/hr at 08/18/17 1324, 3.375 g at 08/18/17 1324 .  rosuvastatin (CRESTOR) tablet 10 mg, 10 mg, Oral, Daily, Domenic Polite, MD, 10 mg at 08/18/17 0910 .  saccharomyces boulardii (FLORASTOR) capsule 250 mg, 250 mg, Oral, BID, Domenic Polite, MD, 250 mg at 08/18/17 0909 .  thiamine (VITAMIN B-1) tablet 100 mg, 100 mg, Oral, Daily, Domenic Polite, MD, 100 mg at 08/18/17 0910 .  vitamin B-12 (CYANOCOBALAMIN) tablet 1,000 mcg, 1,000 mcg, Oral, Daily, Costello, Mary A, NP, 1,000 mcg at 08/18/17 0909  Patients Current Diet: Aspiration precautions Diet vegetarian Room service appropriate? Yes; Fluid consistency: Thin  Precautions / Restrictions Precautions Precautions: Fall Precaution Comments: up with assist only Restrictions Weight Bearing Restrictions: No   Has the patient had 2 or more falls or a fall with injury in the past year?Yes  Prior Activity Level Community (5-7x/wk): Prior to 08/06/17 Dr.K was fully independent and active. He is a retired Network engineer, who taught at [employer], and Art therapist.  He has a history of knee problems in 2016 and would occasionally use a cane, but this did not impede him prior to admission.   Home Assistive Devices / Equipment Home Assistive Devices/Equipment: Eyeglasses Home Equipment: Walker - 2 wheels, cane   Prior Device Use: Indicate devices/aids used by the patient prior to current illness, exacerbation or injury? Intermittent use of single point cane   Prior Functional Level Prior Function Level of Independence: Independent Comments: Pt was fully independent including community activities, and driving up until ~4/65 at which time he started to have falls and confusion.  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 Independent  Indoor Mobility: Did the patient need assistance  with walking from room to room (with or without device)? Independent  Stairs: Did the patient need assistance with internal or external stairs (with or without device)? Independent  Functional Cognition: Did the patient need help planning regular tasks such as shopping or remembering to take medications? Independent  Current Functional Level Cognition  Overall Cognitive Status: Impaired/Different from baseline Current Attention Level: Sustained Orientation Level: Oriented X4 Following Commands: Follows one step commands inconsistently, Follows one step commands with increased time Safety/Judgement: Decreased awareness of deficits, Decreased awareness of safety General Comments: increased time to respond to cues, fearful of falling and unsafe despite cues for safety    Extremity Assessment (includes Sensation/Coordination)  Upper Extremity Assessment: Generalized weakness  Lower Extremity Assessment: Defer to PT evaluation    ADLs  Overall ADL's : Needs assistance/impaired Eating/Feeding: Minimal assistance, Bed  level Grooming: Wash/dry hands, Wash/dry face, Minimal assistance, Sitting Grooming Details (indicate cue type and reason): assist for thoroughness  Upper Body Bathing: Maximal assistance, Sitting Lower Body Bathing: Maximal assistance, Sit to/from stand Lower Body Bathing Details (indicate cue type and reason): assist for thoroughness and sequencing  Upper Body Dressing : Maximal assistance, Sitting Lower Body Dressing: Total assistance, Sit to/from stand Lower Body Dressing Details (indicate cue type and reason): Pt unable to access feet to don socks.  Unable to problem solve through possible solutions or to conclude that he was unable to perform task.  He just sat wiggling his feet while holding his socks.   Toilet Transfer: Moderate assistance, +2 for safety/equipment, Stand-pivot, BSC Toilet Transfer Details (indicate cue type and reason): max verbal cues for sequencing and mod A for balance and to assist with weight shift  Toileting- Clothing Manipulation and Hygiene: Maximal assistance, Sit to/from stand Functional mobility during ADLs: Moderate assistance, +2 for safety/equipment General ADL Comments: Pt requires assist due to impaired cognition and balance.  He demonstrates significant motor planning deficits     Mobility  Overal bed mobility: Needs Assistance Bed Mobility: Supine to Sit, Sit to Supine Supine to sit: Max assist, HOB elevated Sit to supine: Mod assist General bed mobility comments: up in chair    Transfers  Overall transfer level: Needs assistance Equipment used: Rolling walker (2 wheeled) Transfers: Sit to/from Stand Sit to Stand: Mod assist, +2 safety/equipment Stand pivot transfers: Mod assist, +2 safety/equipment General transfer comment: initially too posterior to stsand, pt cued to reach for feet several times to improve anterior weight shift and improved safety/less assist with sit to stand, needs lifting and lowering help    Ambulation / Gait / Stairs /  Wheelchair Mobility  Ambulation/Gait Ambulation/Gait assistance: Mod assist, +2 safety/equipment Ambulation Distance (Feet): 50 Feet(& 2 x 8' in room) Assistive device: Rolling walker (2 wheeled) Gait Pattern/deviations: Step-to pattern, Decreased stride length, Drifts right/left, Shuffle, Staggering left General Gait Details: increased forward and L lateral flexion with ambulation in hallway, able to assist to improve upright if righting head, but reutrns to leaning L due to fear of falling and reaching for wall rail in hallway despite cues to keep hands on walker.  Needs assist to push walker to keep moving forward due to relutant to walk Gait velocity: slow Gait velocity interpretation: Below normal speed for age/gender    Posture / Balance Dynamic Sitting Balance Sitting balance - Comments: leans back, can sit with S if hands on chair Balance Overall balance assessment: Needs assistance Sitting-balance support: Feet supported Sitting balance-Leahy Scale: Poor Sitting balance - Comments: leans back, can sit with  S if hands on chair Postural control: Posterior lean Standing balance support: Bilateral upper extremity supported Standing balance-Leahy Scale: Poor Standing balance comment: min to mod A in standing with UE support, stood to read white board with continued posterior lean and L lateral neck flexion    Special needs/care consideration BiPAP/CPAP: No CPM: No Continuous Drip IV: No Dialysis: No         Life Vest: No Oxygen: No Special Bed: No Trach Size: No Wound Vac (area): No       Skin: Dry, Abrasions to bilateral knees, Bruising to arms, chest, knees, and legs                         Bowel mgmt: Continent, last BM 08/18/17 Bladder mgmt: Continent, history of urinary retention  Diabetic mgmt: Yes, and patient maintained with daily CBG checks and oral medication     Previous Home Environment Living Arrangements: Spouse/significant other Available Help at Discharge:  Family, Friend(s), Available PRN/intermittently, Available 24 hours/day Type of Home: House Home Layout: Two level, Able to live on main level with bedroom/bathroom Alternate Level Stairs-Number of Steps: full flight  Home Access: Stairs to enter Technical brewer of Steps: 2-3 Bathroom Shower/Tub: Multimedia programmer: Handicapped height Home Care Services: No Additional Comments: Pt lives with wife.  Sons are able to assist some.  They have moved bed from upstairs bedroom onto the main level   Discharge Living Setting Plans for Discharge Living Setting: Patient's home, Lives with (comment)(Spouse ) Type of Home at Discharge: House Discharge Home Layout: Two level, Able to live on main level with bedroom/bathroom(1/2 bathroom and a shower, seperate ) Alternate Level Stairs-Rails: Left Alternate Level Stairs-Number of Steps: 1 flight, 13-14 steps Discharge Home Access: Stairs to enter Entrance Stairs-Rails: None Entrance Stairs-Number of Steps: 2-3 Discharge Bathroom Shower/Tub: Walk-in shower Discharge Bathroom Toilet: Handicapped height Discharge Bathroom Accessibility: No Does the patient have any problems obtaining your medications?: No  Social/Family/Support Systems Patient Roles: Spouse, Parent Contact Information: Spouse: Land Anticipated Caregiver: Spouse and hired assist as needed  Equities trader Information: see above  Ability/Limitations of Caregiver: Spouse likely able to do Min A, if limited in amount of physical assist she will look into hired assist  Caregiver Availability: 24/7 Discharge Plan Discussed with Primary Caregiver: Yes Is Caregiver In Agreement with Plan?: Yes Does Caregiver/Family have Issues with Lodging/Transportation while Pt is in Rehab?: No  Goals/Additional Needs Patient/Family Goal for Rehab: PT/OT: Min A; SLP: Min-Mod A Expected length of stay: 19-24 days  Cultural Considerations: Hindu, spiritual,  vegeterian with no eggs but dairy is ok Dietary Needs: Vegetarian diet  Equipment Needs: TBD Pt/Family Agrees to Admission and willing to participate: Yes Program Orientation Provided & Reviewed with Pt/Caregiver Including Roles  & Responsibilities: Yes  Barriers to Discharge: Medical stability, Home environment access/layout   Decrease burden of Care through IP rehab admission: No  Possible need for SNF placement upon discharge: No  Patient Condition: This patient's medical and functional status has changed since the consult dated: 08/16/17 at 1352 in which the Rehabilitation Physician determined and documented that the patient's condition is appropriate for intensive rehabilitative care in an inpatient rehabilitation facility. See "History of Present Illness" (above) for medical update. Functional changes are: Mod A +2 for 50 feet. Patient's medical and functional status update has been discussed with the Rehabilitation physician and patient remains appropriate for inpatient rehabilitation. Will admit to inpatient rehab today.  Preadmission Screen Completed By:  Gunnar Fusi, 08/18/2017 4:13 PM ______________________________________________________________________   Discussed status with Dr. Naaman Plummer on 08/18/17 at 1620 and received telephone approval for admission today.  Admission Coordinator:  Gunnar Fusi, time 1620/Date 08/18/17

## 2017-08-18 NOTE — Progress Notes (Signed)
CSW alerted that patient is admitting to CIR today. No further CSW needs.  CSW signing off.  Laveda Abbe, [place] Clinical Social Worker [phone]

## 2017-08-18 NOTE — H&P (Signed)
Physical Medicine and Rehabilitation Admission H&P    Chief Complaint  Patient presents with  . Encephalopathy.     HPI:  [name] is a 77 y.o. 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 He was discharged to home but continued to worsen with onset of sundowning and was admitted 08/11/17 for work up.  Family reported gradual memory decline which has been worse in the past few weeks to months. CT head reviewed, unremarkable for acute intracranial process. Per report, chronic microangiopathic ischemic changes of periventricular and subcortical white matter with cerebral atrophy.  He was noted to be hyponatremic with Na-126. CXR negative.  UDS negative. Respiratory panel negative. Patient noted to be somnolent with significant cognitive deficits.    Neurology consulted and felt that patient with metabolic encephalopathy due to acute hyponatremia, possible ETOH withdrawal v/s Wernicke's encephalopathy v/s underlying dementia or sensory ataxia. He has history of daily alcohol use and was started on IV thiamine. He developed fever of 102 on 3/25, was pan cultured and was started on IV Zosyn for likely aspiration LLL PNA. Foley placed briefly for urinary retention. MRI thoracic spine showed mild degenerative spondylosis but no acute abnormality. mentation improving and urinary retention has resolved. Therapy ongoing and CIR recommended due to functional decline. Discussed case with rehab team and Cards.  Will discuss with hospitalist.    Review of Systems  Constitutional: Negative for chills and fever.  HENT: Negative for hearing loss and tinnitus.   Eyes: Negative for blurred vision and double vision.  Respiratory: Negative for cough.   Cardiovascular: Negative for chest pain and palpitations.  Gastrointestinal: Negative  for abdominal pain, heartburn and nausea.  Genitourinary: Negative for dysuria and urgency.  Musculoskeletal: Positive for back pain and myalgias.  Neurological: Negative for dizziness and headaches.  Psychiatric/Behavioral: Positive for depression.      Past Medical History:  Diagnosis Date  . DM (diabetes mellitus) ([place])   . GERD (gastroesophageal reflux disease)   . Hyperlipidemia   . Hypertension   . 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 Osteoarthritis   . Osteopenia   . Rheumatoid arthritis ([place])   . Thyroid disease     Past Surgical History:  Procedure Laterality Date  . COLONOSCOPY      Family History  Problem Relation Age of Onset  . Diabetes Mellitus I Father   . Diabetes Mellitus I Mother   . CAD Brother     Social History:  Married. 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 Independent till a few weeks PTA?  Per reports that he has quit smoking 2016.  His smoking use included cigarettes. He has never used smokeless tobacco. He drinks 2-3 glass wine ( as increased to 4 glasses for past few weeks). He reports that he does not use drugs.      Allergies  Allergen Reactions  . Hydrocodone Nausea Only    Medications Prior to Admission  Medication Sig Dispense Refill  . apixaban (ELIQUIS) 5 MG TABS tablet Take 5 mg by mouth 2 (two) times daily.    . diclofenac sodium (VOLTAREN) 1 % GEL Apply topically 4 (four) times daily.    Marland Kitchen esomeprazole (NEXIUM) 40 MG capsule Take 40 mg by mouth daily after supper.   1  . folic acid (FOLVITE) 1 MG tablet Take 1 mg by mouth daily.    Marland Kitchen  irbesartan (AVAPRO) 150 MG tablet Take 150 mg by mouth daily.     Marland Kitchen levothyroxine (SYNTHROID, LEVOTHROID) 75 MCG tablet Take 75 mcg by mouth daily before breakfast.    . metFORMIN (GLUCOPHAGE-XR) 500 MG 24 hr tablet Take 500 mg by mouth 2 (two) times daily.   0  . methotrexate (50 MG/ML) 1 g injection Inject into the vein once.    . metoprolol tartrate (LOPRESSOR) 25 MG tablet Take 1 tablet (25 mg total) by  mouth 2 (two) times daily. 180 tablet 2  . rosuvastatin (CRESTOR) 10 MG tablet Take 10 mg by mouth daily.  0    Drug Regimen Review  Drug regimen was reviewed and remains appropriate with no significant issues identified  Home: Home Living Family/patient expects to be discharged to:: Private residence Living Arrangements: Spouse/significant other Available Help at Discharge: Family, Friend(s), Available PRN/intermittently, Available 24 hours/day Type of Home: House Home Access: Stairs to enter [employer] of Steps: 2-3 Home Layout: Two level, Able to live on main level with bedroom/bathroom Alternate Level Stairs-Number of Steps: full flight  Bathroom Shower/Tub: Multimedia programmer: Handicapped height Home Equipment: Environmental consultant - 2 wheels Additional Comments: Pt lives with wife.  Sons are able to assist some.  They have moved bed from upstairs bedroom onto the main level    Functional History: Prior Function Level of Independence: Independent Comments: Pt was fully independent including community activities, and driving up until ~1/61 at which time he started to have falls and confusion.  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   Functional Status:  Mobility: Bed Mobility Overal bed mobility: Needs Assistance Bed Mobility: Supine to Sit, Sit to Supine Supine to sit: Max assist, HOB elevated Sit to supine: Mod assist General bed mobility comments: up in chair Transfers Overall transfer level: Needs assistance Equipment used: Rolling walker (2 wheeled) Transfers: Sit to/from Stand Sit to Stand: Mod assist, +2 safety/equipment Stand pivot transfers: Mod assist, +2 safety/equipment General transfer comment: initially too posterior to stsand, pt cued to reach for feet several times to improve anterior weight shift and improved safety/less assist with sit to stand, needs lifting and lowering help Ambulation/Gait Ambulation/Gait  assistance: Mod assist, +2 safety/equipment Ambulation Distance (Feet): 50 Feet(& 2 x 8' in room) Assistive device: Rolling walker (2 wheeled) Gait Pattern/deviations: Step-to pattern, Decreased stride length, Drifts right/left, Shuffle, Staggering left General Gait Details: increased forward and L lateral flexion with ambulation in hallway, able to assist to improve upright if righting head, but reutrns to leaning L due to fear of falling and reaching for wall rail in hallway despite cues to keep hands on walker.  Needs assist to push walker to keep moving forward due to relutant to walk Gait velocity: slow Gait velocity interpretation: Below normal speed for age/gender    ADL: ADL Overall ADL's : Needs assistance/impaired Eating/Feeding: Minimal assistance, Bed level Grooming: Wash/dry hands, Wash/dry face, Minimal assistance, Sitting Grooming Details (indicate cue type and reason): assist for thoroughness  Upper Body Bathing: Maximal assistance, Sitting Lower Body Bathing: Maximal assistance, Sit to/from stand Lower Body Bathing Details (indicate cue type and reason): assist for thoroughness and sequencing  Upper Body Dressing : Maximal assistance, Sitting Lower Body Dressing: Total assistance, Sit to/from stand Lower Body Dressing Details (indicate cue type and reason): Pt unable to access feet to don socks.  Unable to problem solve through possible solutions or to conclude that he was unable to perform task.  He  just sat wiggling his feet while holding his socks.   Toilet Transfer: Moderate assistance, +2 for safety/equipment, Stand-pivot, BSC Toilet Transfer Details (indicate cue type and reason): max verbal cues for sequencing and mod A for balance and to assist with weight shift  Toileting- Clothing Manipulation and Hygiene: Maximal assistance, Sit to/from stand Functional mobility during ADLs: Moderate assistance, +2 for safety/equipment General ADL Comments: Pt requires assist due  to impaired cognition and balance.  He demonstrates significant motor planning deficits   Cognition: Cognition Overall Cognitive Status: Impaired/Different from baseline Orientation Level: Oriented X4 Cognition Arousal/Alertness: Awake/alert Behavior During Therapy: Flat affect Overall Cognitive Status: Impaired/Different from baseline Area of Impairment: Attention, Memory, Following commands, Safety/judgement, Problem solving Orientation Level: Disoriented to, Time, Situation, Place Current Attention Level: Sustained Memory: Decreased short-term memory Following Commands: Follows one step commands inconsistently, Follows one step commands with increased time Safety/Judgement: Decreased awareness of deficits, Decreased awareness of safety Awareness: (none) Problem Solving: Decreased initiation, Slow processing, Difficulty sequencing, Requires verbal cues, Requires tactile cues General Comments: increased time to respond to cues, fearful of falling and unsafe despite cues for safety   Blood pressure 126/70, pulse (!) 54, temperature 99.1 F (37.3 C), temperature source Oral, resp. rate 18, height 5' 7"  (1.702 m), weight 69.4 kg (153 lb), SpO2 99 %. Physical Exam  Nursing note and vitals reviewed. Constitutional: He is oriented to person, place, and time. He appears well-developed and well-nourished. No distress.  HENT:  Head: Normocephalic and atraumatic.  Mouth/Throat: Oropharynx is clear and moist.  Eyes: Pupils are equal, round, and reactive to light. Conjunctivae and EOM are normal. Right eye exhibits no discharge. Left eye exhibits no discharge.  Neck: Normal range of motion. Neck supple.  Cardiovascular: Normal rate and regular rhythm.  No murmur heard. Respiratory: Effort normal and breath sounds normal. No stridor. No respiratory distress. He has no wheezes.  GI: Soft. Bowel sounds are normal. He exhibits no distension. There is no tenderness.  Musculoskeletal: He exhibits  no edema or tenderness.  Neurological: He is alert and oriented to person, place, and time. No cranial nerve deficit.  Reasonable insight and awareness. Answers biographical questions. Follows all commands. UE motor 4/5 prox to distal. LE: 3 to 3+ HF, 4- KE and 4/5 ADF/PF. Mild sensory loss distal LE's, inconsistent.. No ataxia.  Skin: Skin is warm and dry. He is not diaphoretic.  Abrasions on both knees  Psychiatric: He has a normal mood and affect. His behavior is normal. Thought content normal.    Results for orders placed or performed during the hospital encounter of 08/12/17 (from the past 48 hour(s))  Glucose, capillary     Status: Abnormal   Collection Time: 08/16/17  3:21 PM  Result Value Ref Range   Glucose-Capillary 225 (H) 65 - 99 mg/dL  Glucose, capillary     Status: Abnormal   Collection Time: 08/16/17  4:42 PM  Result Value Ref Range   Glucose-Capillary 157 (H) 65 - 99 mg/dL   Comment 1 Notify RN    Comment 2 Document in Chart   Glucose, capillary     Status: Abnormal   Collection Time: 08/16/17 10:06 PM  Result Value Ref Range   Glucose-Capillary 125 (H) 65 - 99 mg/dL   Comment 1 Notify RN    Comment 2 Document in Chart   CBC     Status: Abnormal   Collection Time: 08/17/17  6:21 AM  Result Value Ref Range   WBC 9.2 4.0 - 10.5  K/uL   RBC 3.94 (L) 4.22 - 5.81 MIL/uL   Hemoglobin 12.8 (L) 13.0 - 17.0 g/dL   HCT 37.4 (L) 39.0 - 52.0 %   MCV 94.9 78.0 - 100.0 fL   MCH 32.5 26.0 - 34.0 pg   MCHC 34.2 30.0 - 36.0 g/dL   RDW 13.8 11.5 - 15.5 %   Platelets 233 150 - 400 K/uL    Comment: Performed at [facility] [address], [place], [place] [address]  Basic metabolic panel     Status: Abnormal   Collection Time: 08/17/17  6:21 AM  Result Value Ref Range   Sodium 137 135 - 145 mmol/L   Potassium 4.9 3.5 - 5.1 mmol/L   Chloride 110 101 - 111 mmol/L   CO2 22 22 - 32 mmol/L   Glucose, Bld 110 (H) 65 - 99 mg/dL   BUN 6 6 - 20 mg/dL   Creatinine, Ser 0.92  0.61 - 1.24 mg/dL   Calcium 8.5 (L) 8.9 - 10.3 mg/dL   GFR calc non Af Amer >60 >60 mL/min   GFR calc Af Amer >60 >60 mL/min    Comment: (NOTE) The eGFR has been calculated using the CKD EPI equation. This calculation has not been validated in all clinical situations. eGFR's persistently <60 mL/min signify possible Chronic Kidney Disease.    Anion gap 5 5 - 15    Comment: Performed at [facility] [address], [place],  [address]  Procalcitonin - Baseline     Status: None   Collection Time: 08/17/17  6:21 AM  Result Value Ref Range   Procalcitonin 0.25 ng/mL    Comment:        Interpretation: PCT (Procalcitonin) <= 0.5 ng/mL: Systemic infection (sepsis) is not likely. Local bacterial infection is possible. (NOTE)       Sepsis PCT Algorithm           Lower Respiratory Tract                                      Infection PCT Algorithm    ----------------------------     ----------------------------         PCT < 0.25 ng/mL                PCT < 0.10 ng/mL         Strongly encourage             Strongly discourage   discontinuation of antibiotics    initiation of antibiotics    ----------------------------     -----------------------------       PCT 0.25 - 0.50 ng/mL            PCT 0.10 - 0.25 ng/mL               OR       >80% decrease in PCT            Discourage initiation of                                            antibiotics      Encourage discontinuation           of antibiotics    ----------------------------     -----------------------------  PCT >= 0.50 ng/mL              PCT 0.26 - 0.50 ng/mL               AND        <80% decrease in PCT             Encourage initiation of                                             antibiotics       Encourage continuation           of antibiotics    ----------------------------     -----------------------------        PCT >= 0.50 ng/mL                  PCT > 0.50 ng/mL               AND         increase  in PCT                  Strongly encourage                                      initiation of antibiotics    Strongly encourage escalation           of antibiotics                                     -----------------------------                                           PCT <= 0.25 ng/mL                                                 OR                                        > 80% decrease in PCT                                     Discontinue / Do not initiate                                             antibiotics Performed at [facility], [address], [place], [place] [address]   Glucose, capillary     Status: None   Collection Time: 08/17/17  6:33 AM  Result Value Ref Range   Glucose-Capillary 94 65 - 99 mg/dL   Comment 1 Notify RN    Comment 2 Document in Chart   Glucose, capillary  Status: Abnormal   Collection Time: 08/17/17  7:59 AM  Result Value Ref Range   Glucose-Capillary 101 (H) 65 - 99 mg/dL  Glucose, capillary     Status: Abnormal   Collection Time: 08/17/17 11:47 AM  Result Value Ref Range   Glucose-Capillary 137 (H) 65 - 99 mg/dL  Glucose, capillary     Status: Abnormal   Collection Time: 08/17/17  4:36 PM  Result Value Ref Range   Glucose-Capillary 158 (H) 65 - 99 mg/dL  Glucose, capillary     Status: Abnormal   Collection Time: 08/17/17  9:20 PM  Result Value Ref Range   Glucose-Capillary 192 (H) 65 - 99 mg/dL   Comment 1 Notify RN    Comment 2 Document in Chart   Glucose, capillary     Status: Abnormal   Collection Time: 08/18/17  6:15 AM  Result Value Ref Range   Glucose-Capillary 111 (H) 65 - 99 mg/dL   Comment 1 Notify RN    Comment 2 Document in Chart   CBC     Status: Abnormal   Collection Time: 08/18/17  7:25 AM  Result Value Ref Range   WBC 8.7 4.0 - 10.5 K/uL   RBC 3.61 (L) 4.22 - 5.81 MIL/uL   Hemoglobin 11.6 (L) 13.0 - 17.0 g/dL   HCT 33.9 (L) 39.0 - 52.0 %   MCV 93.9 78.0 - 100.0 fL   MCH 32.1 26.0 - 34.0 pg   MCHC 34.2  30.0 - 36.0 g/dL   RDW 13.7 11.5 - 15.5 %   Platelets 218 150 - 400 K/uL    Comment: Performed at [facility], [address], [place], [place] [address]  Basic metabolic panel     Status: Abnormal   Collection Time: 08/18/17  7:25 AM  Result Value Ref Range   Sodium 133 (L) 135 - 145 mmol/L   Potassium 3.3 (L) 3.5 - 5.1 mmol/L   Chloride 107 101 - 111 mmol/L   CO2 19 (L) 22 - 32 mmol/L   Glucose, Bld 103 (H) 65 - 99 mg/dL   BUN <5 (L) 6 - 20 mg/dL   Creatinine, Ser 0.80 0.61 - 1.24 mg/dL   Calcium 8.3 (L) 8.9 - 10.3 mg/dL   GFR calc non Af Amer >60 >60 mL/min   GFR calc Af Amer >60 >60 mL/min    Comment: (NOTE) The eGFR has been calculated using the CKD EPI equation. This calculation has not been validated in all clinical situations. eGFR's persistently <60 mL/min signify possible Chronic Kidney Disease.    Anion gap 7 5 - 15    Comment: Performed at [facility] [address], [place], [place] [address]  Glucose, capillary     Status: Abnormal   Collection Time: 08/18/17 11:37 AM  Result Value Ref Range   Glucose-Capillary 132 (H) 65 - 99 mg/dL   Comment 1 Notify RN    Comment 2 Document in Chart    No results found.     Medical Problem List and Plan: 1.  Functional and cognitive/mobility deficits secondary to multi-factorial gait disorder, metabolic encephalopathy   -admit to inpatient rehab 2.  DVT Prophylaxis/Anticoagulation: Pharmaceutical: Other (comment) Eliquis 3. Pain Management: pt with hx of RA and lumbar spondylosis/ radiculopathy, OA knees   -tylenol prn   -voltaren gel prn   -kpad for back   -observe tolerance of activities with therapy 4. Mood: LCSW to follow for evaluation and support.  5. Neuropsych: This  patient is not fully  capable of making decisions on his own behalf. 6. Skin/Wound Care: routine pressure relief measures.  7. Fluids/Electrolytes/Nutrition: Monitor I/O. Check lytes in am. 8. Aspiration PNA: On Zosyn D#5  9.  Hyponatremia: Felt to be due to PNA--resolving. 10 PAF:  On Eliquis, metoprolol and tambocor (lower dose per cards). Monitor HR  bid.  11. BPH: off Flomax due to orthostasis? Monitor for voiding for now.      Post Admission Physician Evaluation: 1. Functional deficits secondary  to multifactorial gait disorder/encephalopathy. 2. Patient is admitted to receive collaborative, interdisciplinary care between the physiatrist, rehab nursing staff, and therapy team. 3. Patient's level of medical complexity and substantial therapy needs in context of that medical necessity cannot be provided at a lesser intensity of care such as a SNF. 4. Patient has experienced substantial functional loss from his/her baseline which was documented above under the "Functional History" and "Functional Status" headings.  Judging by the patient's diagnosis, physical exam, and functional history, the patient has potential for functional progress which will result in measurable gains while on inpatient rehab.  These gains will be of substantial and practical use upon discharge  in facilitating mobility and self-care at the household level. 5. Physiatrist will provide 24 hour management of medical needs as well as oversight of the therapy plan/treatment and provide guidance as appropriate regarding the interaction of the two. 6. The Preadmission Screening has been reviewed and patient status is unchanged unless otherwise stated above. 7. 24 hour rehab nursing will assist with bladder management, bowel management, safety, skin/wound care, disease management, medication administration, pain management and patient education  and help integrate therapy concepts, techniques,education, etc. 8. PT will assess and treat for/with: Lower extremity strength, range of motion, stamina, balance, functional mobility, safety, adaptive techniques and equipment, NMR, family education, pain control, orthotics.   Goals are: supervision to min  assist. 9. OT will assess and treat for/with: ADL's, functional mobility, safety, upper extremity strength, adaptive techniques and equipment, NMR, pain control, family ed.   Goals are: supervision to min assist. Therapy may proceed with showering this patient. 10. SLP will assess and treat for/with: cognition, communication, family ed.  Goals are: supervision. 11. Case Management and Social Worker will assess and treat for psychological issues and discharge planning. 12. Team conference will be held weekly to assess progress toward goals and to determine barriers to discharge. 13. Patient will receive at least 3 hours of therapy per day at least 5 days per week. 14. ELOS: 16-22 days       15. Prognosis:  excellent     Meredith Staggers, MD, [facility] 08/18/2017  Bary Leriche, PA-C 08/18/2017

## 2017-08-18 NOTE — H&P (Signed)
Physical Medicine and Rehabilitation Admission H&P     Chief Complaint  Patient presents with  . Encephalopathy.   HPI: [name] is a 77 y.o. 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 He was discharged to home but continued to worsen with onset of sundowning and was admitted 08/11/17 for work up. Family reported gradual memory decline which has been worse in the past few weeks to months. CT head reviewed, unremarkable for acute intracranial process. Per report, chronic microangiopathic ischemic changes of periventricular and subcortical white matter with cerebral atrophy. He was noted to be hyponatremic with Na-126. CXR negative. UDS negative. Respiratory panel negative. Patient noted to be somnolent with significant cognitive deficits.  Neurology consulted and felt that patient with metabolic encephalopathy due to acute hyponatremia, possible ETOH withdrawal v/s Wernicke's encephalopathy v/s underlying dementia or sensory ataxia. He has history of daily alcohol use and was started on IV thiamine. He developed fever of 102 on 3/25, was pan cultured and was started on IV Zosyn for likely aspiration LLL PNA. Foley placed briefly for urinary retention. MRI thoracic spine showed mild degenerative spondylosis but no acute abnormality. mentation improving and urinary retention has resolved. Therapy ongoing and CIR recommended due to functional decline.     Review of Systems  Constitutional: Negative for chills and fever.  HENT: Negative for hearing loss and tinnitus.  Eyes: Negative for blurred vision and double vision.  Respiratory: Negative for cough.  Cardiovascular: Negative for chest pain and palpitations.  Gastrointestinal: Negative for abdominal pain, heartburn and nausea.  Genitourinary: Negative for dysuria and  urgency.  Musculoskeletal: Positive for back pain and myalgias.  Neurological: Negative for dizziness and headaches.  Psychiatric/Behavioral: Positive for depression.       Past Medical History:  Diagnosis Date  . DM (diabetes mellitus) ([place])   . GERD (gastroesophageal reflux disease)   . Hyperlipidemia   . Hypertension   . 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 Osteoarthritis   . Osteopenia   . Rheumatoid arthritis ([place])   . Thyroid disease         Past Surgical History:  Procedure Laterality Date  . COLONOSCOPY          Family History  Problem Relation Age of Onset  . Diabetes Mellitus I Father   . Diabetes Mellitus I Mother   . CAD Brother    Social History: Married. 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 Independent till a few weeks PTA? Per reports that he has quit smoking 2016. His smoking use included cigarettes. He has never used smokeless tobacco. He drinks 2-3 glass wine ( as increased to 4 glasses for past few weeks). He reports that he does not use drugs.      Allergies  Allergen Reactions  . Hydrocodone Nausea Only         Medications Prior to Admission  Medication Sig Dispense Refill  . apixaban (ELIQUIS) 5 MG TABS tablet Take 5 mg by mouth 2 (two) times daily.    . diclofenac sodium (VOLTAREN) 1 % GEL Apply topically 4 (four) times daily.    Marland Kitchen esomeprazole (NEXIUM) 40 MG capsule Take 40 mg by mouth daily after supper.   1  . folic acid (FOLVITE) 1 MG tablet Take 1 mg by mouth daily.    . irbesartan (AVAPRO) 150 MG tablet Take 150 mg by mouth  daily.     . levothyroxine (SYNTHROID, LEVOTHROID) 75 MCG tablet Take 75 mcg by mouth daily before breakfast.    . metFORMIN (GLUCOPHAGE-XR) 500 MG 24 hr tablet Take 500 mg by mouth 2 (two) times daily.   0  . methotrexate (50 MG/ML) 1 g injection Inject into the vein once.    . metoprolol tartrate (LOPRESSOR) 25 MG tablet Take 1 tablet (25 mg total) by mouth 2 (two) times daily. 180 tablet 2  . rosuvastatin (CRESTOR) 10 MG  tablet Take 10 mg by mouth daily.  0   Drug Regimen Review  Drug regimen was reviewed and remains appropriate with no significant issues identified  Home:  Home Living  Family/patient expects to be discharged to:: Private residence  Living Arrangements: Spouse/significant other  Available Help at Discharge: Family, Friend(s), Available PRN/intermittently, Available 24 hours/day  Type of Home: House  Home Access: Stairs to enter  [employer] of Steps: 2-3  Home Layout: Two level, Able to live on main level with bedroom/bathroom  Alternate Level Stairs-Number of Steps: full flight  Bathroom Shower/Tub: Tourist information centre manager: Handicapped height  Home Equipment: Environmental consultant - 2 wheels  Additional Comments: Pt lives with wife. Sons are able to assist some. They have moved bed from upstairs bedroom onto the main level  Functional History:  Prior Function  Level of Independence: Independent  Comments: Pt was fully independent including community activities, and driving up until ~7/61 at which time he started to have falls and confusion. 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  Functional Status:  Mobility:  Bed Mobility  Overal bed mobility: Needs Assistance  Bed Mobility: Supine to Sit, Sit to Supine  Supine to sit: Max assist, HOB elevated  Sit to supine: Mod assist  General bed mobility comments: up in chair  Transfers  Overall transfer level: Needs assistance  Equipment used: Rolling walker (2 wheeled)  Transfers: Sit to/from Stand  Sit to Stand: Mod assist, +2 safety/equipment  Stand pivot transfers: Mod assist, +2 safety/equipment  General transfer comment: initially too posterior to stsand, pt cued to reach for feet several times to improve anterior weight shift and improved safety/less assist with sit to stand, needs lifting and lowering help  Ambulation/Gait  Ambulation/Gait assistance: Mod assist, +2 safety/equipment  Ambulation  Distance (Feet): 50 Feet(& 2 x 8' in room)  Assistive device: Rolling walker (2 wheeled)  Gait Pattern/deviations: Step-to pattern, Decreased stride length, Drifts right/left, Shuffle, Staggering left  General Gait Details: increased forward and L lateral flexion with ambulation in hallway, able to assist to improve upright if righting head, but reutrns to leaning L due to fear of falling and reaching for wall rail in hallway despite cues to keep hands on walker. Needs assist to push walker to keep moving forward due to relutant to walk  Gait velocity: slow  Gait velocity interpretation: Below normal speed for age/gender   ADL:  ADL  Overall ADL's : Needs assistance/impaired  Eating/Feeding: Minimal assistance, Bed level  Grooming: Wash/dry hands, Wash/dry face, Minimal assistance, Sitting  Grooming Details (indicate cue type and reason): assist for thoroughness  Upper Body Bathing: Maximal assistance, Sitting  Lower Body Bathing: Maximal assistance, Sit to/from stand  Lower Body Bathing Details (indicate cue type and reason): assist for thoroughness and sequencing  Upper Body Dressing : Maximal assistance, Sitting  Lower Body Dressing: Total assistance, Sit to/from stand  Lower Body Dressing Details (indicate cue type and reason): Pt unable  to access feet to don socks. Unable to problem solve through possible solutions or to conclude that he was unable to perform task. He just sat wiggling his feet while holding his socks.  Toilet Transfer: Moderate assistance, +2 for safety/equipment, Stand-pivot, BSC  Toilet Transfer Details (indicate cue type and reason): max verbal cues for sequencing and mod A for balance and to assist with weight shift  Toileting- Clothing Manipulation and Hygiene: Maximal assistance, Sit to/from stand  Functional mobility during ADLs: Moderate assistance, +2 for safety/equipment  General ADL Comments: Pt requires assist due to impaired cognition and balance. He  demonstrates significant motor planning deficits  Cognition:  Cognition  Overall Cognitive Status: Impaired/Different from baseline  Orientation Level: Oriented X4  Cognition  Arousal/Alertness: Awake/alert  Behavior During Therapy: Flat affect  Overall Cognitive Status: Impaired/Different from baseline  Area of Impairment: Attention, Memory, Following commands, Safety/judgement, Problem solving  Orientation Level: Disoriented to, Time, Situation, Place  Current Attention Level: Sustained  Memory: Decreased short-term memory  Following Commands: Follows one step commands inconsistently, Follows one step commands with increased time  Safety/Judgement: Decreased awareness of deficits, Decreased awareness of safety  Awareness: (none)  Problem Solving: Decreased initiation, Slow processing, Difficulty sequencing, Requires verbal cues, Requires tactile cues  General Comments: increased time to respond to cues, fearful of falling and unsafe despite cues for safety     Blood pressure 126/70, pulse (!) 54, temperature 99.1 F (37.3 C), temperature source Oral, resp. rate 18, height 5' 7"  (1.702 m), weight 69.4 kg (153 lb), SpO2 99 %.  Physical Exam  Nursing note and vitals reviewed.  Constitutional: He is oriented to person, place, and time. He appears well-developed and well-nourished. No distress.  HENT:  Head: Normocephalic and atraumatic.  Mouth/Throat: Oropharynx is clear and moist.  Eyes: Pupils are equal, round, and reactive to light. Conjunctivae and EOM are normal. Right eye exhibits no discharge. Left eye exhibits no discharge.  Neck: Normal range of motion. Neck supple.  Cardiovascular: Normal rate and regular rhythm.  No murmur heard.  Respiratory: Effort normal and breath sounds normal. No stridor. No respiratory distress. He has no wheezes.  GI: Soft. Bowel sounds are normal. He exhibits no distension. There is no tenderness.  Musculoskeletal: He exhibits no edema or  tenderness.  Neurological: He is alert and oriented to person, place, and time. No cranial nerve deficit.  Reasonable insight and awareness. Answers biographical questions. Follows all commands. UE motor 4/5 prox to distal. LE: 3 to 3+ HF, 4- KE and 4/5 ADF/PF. Mild sensory loss distal LE's, inconsistent.. No ataxia.  Skin: Skin is warm and dry. He is not diaphoretic.  Abrasions on both knees  Psychiatric: He has a normal mood and affect. His behavior is normal. Thought content normal.   Lab Results Last 48 Hours  Imaging Results (Last 48 hours)     Medical Problem List and Plan:  1. Functional and cognitive/mobility deficits secondary to multi-factorial gait disorder, metabolic encephalopathy  -admit to inpatient rehab  2. DVT Prophylaxis/Anticoagulation: Pharmaceutical: Other (comment) Eliquis  3. Pain Management: pt with hx of RA and lumbar spondylosis/ radiculopathy, OA knees  -tylenol prn  -voltaren gel prn  -kpad for back  -observe tolerance of activities with therapy  4. Mood: LCSW to follow for evaluation and support.  5. Neuropsych: This patient is not fully capable of making decisions on his own behalf.  6. Skin/Wound Care: routine pressure relief measures.  7.  Fluids/Electrolytes/Nutrition: Monitor I/O. Check lytes in am.  8. Aspiration PNA: On Zosyn D#5  9. Hyponatremia: Felt to be due to PNA--resolving.  10 PAF: On Eliquis, metoprolol and tambocor (lower dose per cards). Monitor HR bid.  11. BPH: off Flomax due to orthostasis? Monitor for voiding for now.   Post Admission Physician Evaluation:  1. Functional deficits secondary to multifactorial gait disorder/encephalopathy. 2. Patient is admitted to receive collaborative, interdisciplinary care between the physiatrist, rehab nursing staff, and therapy team. 3. Patient's level of medical complexity and substantial therapy needs in context of that medical necessity cannot be provided at a lesser intensity of care such as a SNF. 4. Patient has experienced substantial functional loss from his/her baseline which was documented above under the "Functional History" and "Functional Status" headings. Judging by the patient's diagnosis, physical exam, and functional history, the patient has potential for functional progress which will result in measurable gains while on inpatient rehab. These gains will be of substantial and practical use upon discharge in facilitating mobility and self-care at the household level. 5. Physiatrist will provide 24 hour management of medical needs as well as oversight of the therapy plan/treatment and provide guidance as appropriate regarding the interaction of the two. 6. The Preadmission Screening has been reviewed and patient status is unchanged unless otherwise stated above. 7. 24 hour rehab nursing will assist with bladder management, bowel management, safety, skin/wound care, disease management, medication administration, pain management and patient education and help integrate therapy concepts, techniques,education, etc. 8. PT will assess and treat for/with: Lower extremity strength, range of motion, stamina, balance, functional mobility, safety, adaptive techniques and  equipment, NMR, family education, pain control, orthotics. Goals are: supervision to min assist. 9. OT will assess and treat for/with: ADL's, functional mobility, safety, upper extremity strength, adaptive techniques and equipment, NMR, pain control, family ed. Goals are: supervision to min assist. Therapy may proceed with showering this patient. 10. SLP will assess and treat for/with: cognition, communication, family ed. Goals are: supervision. 11. Case Management and Social Worker will assess and treat for psychological issues and discharge planning. 12. Team conference will be held weekly to assess progress toward goals and to determine barriers to discharge. 13. Patient will receive at least 3 hours of therapy per day at least 5 days per week. 14. ELOS: 16-22 days  15. Prognosis: excellent   Meredith Staggers, MD, [facility]  08/18/2017  Bary Leriche, PA-C  08/18/2017

## 2017-08-19 ENCOUNTER — Inpatient Hospital Stay (HOSPITAL_COMMUNITY): Payer: Medicare Other | Admitting: Physical Therapy

## 2017-08-19 ENCOUNTER — Inpatient Hospital Stay (HOSPITAL_COMMUNITY): Payer: Medicare Other

## 2017-08-19 ENCOUNTER — Inpatient Hospital Stay (HOSPITAL_COMMUNITY): Payer: Medicare Other | Admitting: Speech Pathology

## 2017-08-19 LAB — CBC WITH DIFFERENTIAL/PLATELET
BASOS ABS: 0.1 10*3/uL (ref 0.0–0.1)
Basophils Relative: 1 %
EOS PCT: 5 %
Eosinophils Absolute: 0.4 10*3/uL (ref 0.0–0.7)
HEMATOCRIT: 33.2 % — AB (ref 39.0–52.0)
Hemoglobin: 11.9 g/dL — ABNORMAL LOW (ref 13.0–17.0)
LYMPHS PCT: 28 %
Lymphs Abs: 2.5 10*3/uL (ref 0.7–4.0)
MCH: 33.2 pg (ref 26.0–34.0)
MCHC: 35.8 g/dL (ref 30.0–36.0)
MCV: 92.7 fL (ref 78.0–100.0)
Monocytes Absolute: 1.3 10*3/uL — ABNORMAL HIGH (ref 0.1–1.0)
Monocytes Relative: 15 %
NEUTROS ABS: 4.5 10*3/uL (ref 1.7–7.7)
Neutrophils Relative %: 51 %
Platelets: 228 10*3/uL (ref 150–400)
RBC: 3.58 MIL/uL — ABNORMAL LOW (ref 4.22–5.81)
RDW: 14.2 % (ref 11.5–15.5)
WBC: 8.8 10*3/uL (ref 4.0–10.5)

## 2017-08-19 LAB — COMPREHENSIVE METABOLIC PANEL
ALBUMIN: 2.1 g/dL — AB (ref 3.5–5.0)
ALT: 21 U/L (ref 17–63)
AST: 49 U/L — AB (ref 15–41)
Alkaline Phosphatase: 60 U/L (ref 38–126)
Anion gap: 10 (ref 5–15)
BILIRUBIN TOTAL: 1.7 mg/dL — AB (ref 0.3–1.2)
BUN: 5 mg/dL — AB (ref 6–20)
CALCIUM: 8.3 mg/dL — AB (ref 8.9–10.3)
CO2: 18 mmol/L — ABNORMAL LOW (ref 22–32)
Chloride: 106 mmol/L (ref 101–111)
Creatinine, Ser: 0.79 mg/dL (ref 0.61–1.24)
GFR calc Af Amer: >60 mL/min (ref >60)
GFR calc non Af Amer: >60 mL/min (ref >60)
GLUCOSE: 100 mg/dL — AB (ref 65–99)
Potassium: 4 mmol/L (ref 3.5–5.1)
Sodium: 134 mmol/L — ABNORMAL LOW (ref 135–145)
TOTAL PROTEIN: 5.1 g/dL — AB (ref 6.5–8.1)

## 2017-08-19 LAB — CULTURE, BLOOD (ROUTINE X 2)
CULTURE: NO GROWTH
Culture: NO GROWTH
Special Requests: ADEQUATE

## 2017-08-19 LAB — GLUCOSE, CAPILLARY
Glucose-Capillary: 108 mg/dL — ABNORMAL HIGH (ref 65–99)
Glucose-Capillary: 214 mg/dL — ABNORMAL HIGH (ref 65–99)
Glucose-Capillary: 97 mg/dL (ref 65–99)

## 2017-08-19 NOTE — Evaluation (Signed)
Physical Therapy Assessment and Plan  Patient Details  Name: [name] MRN: [id number] Date of Birth: Oct 05, 1940  PT Diagnosis: Abnormality of gait, Cognitive deficits, Coordination disorder, Difficulty walking, Impaired cognition and Muscle weakness Rehab Potential: Excellent ELOS: 12-14 days   Today's Date: 08/19/2017 PT Individual Time: 1515-1610 PT Individual Time Calculation (min): 55 min    Problem List:  Patient Active Problem List   Diagnosis Date Noted  . Encephalopathy 08/18/2017  . Fever   . SIADH (syndrome of inappropriate ADH production) ([place])   . PAF (paroxysmal atrial fibrillation) ([place])   . Type 2 diabetes mellitus with peripheral neuropathy (HCC)   . Hypokalemia   . Urinary retention   . Acute blood loss anemia   . Hypothyroidism   . Spinal stenosis 08/12/2017  . 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 Type 2 diabetes mellitus with other specified complication ([place]) 25/00/3704  . Hyponatremia 08/12/2017  . Atrial fibrillation ([place]) 01/16/2016  . Rheumatoid arthritis ([place]) 01/16/2016  . History of artificial joint 12/19/2014  . H/O knee surgery 12/19/2014  . Primary osteoarthritis of both knees 10/27/2014  . Osteopenia 03/31/2014  . HEARTBURN 09/04/2009    Past Medical History:  Past Medical History:  Diagnosis Date  . DM (diabetes mellitus) ([place])   . GERD (gastroesophageal reflux disease)   . Hyperlipidemia   . Hypertension   . Legionella pneumonia ([place])   . Osteoarthritis   . Osteopenia   . Rheumatoid arthritis ([place])   . Thyroid disease    Past Surgical History:  Past Surgical History:  Procedure Laterality Date  . COLONOSCOPY      Assessment & Plan Clinical Impression: Patient is a 77 y.o. 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 He was discharged to home but continued to  worsen with onset of sundowning and was admitted 08/11/17 for work up. Family reported gradual memory decline which has been worse in the past few weeks to months. CT head reviewed, unremarkable for acute intracranial process. Per report, chronic microangiopathic ischemic changes of periventricular and subcortical white matter with cerebral atrophy. He was noted to be hyponatremic with Na-126. CXR negative. UDS negative. Respiratory panel negative. Patient noted to be somnolent with significant cognitive deficits. Neurology consulted and felt that patient with metabolic encephalopathy due to acute hyponatremia, possible ETOH withdrawal v/s Wernicke's encephalopathy v/s underlying dementia or sensory ataxia. He has history of daily alcohol use and was started on IV thiamine. He developed fever of 102 on 3/25, was pan cultured and was started on IV Zosyn for likely aspiration LLL PNA. Foley placed briefly for urinary retention. MRI thoracic spine showed mild degenerative spondylosis but no acute abnormality. mentation improving and urinary retention has resolved. Therapy ongoing and CIR recommended due to functional decline.  Patient transferred to CIR on 08/18/2017 .   Patient currently requires min with mobility secondary to muscle weakness, decreased cardiorespiratoy endurance, decreased coordination and decreased motor planning, decreased initiation, decreased problem solving, decreased safety awareness, decreased memory and delayed processing and decreased standing balance and decreased balance strategies.  Prior to hospitalization, patient was independent  with mobility and lived with Spouse in a House home.  Home access is 2-3Stairs to enter.  Patient will benefit from skilled PT intervention to maximize safe functional mobility, minimize fall risk and decrease caregiver burden for planned discharge home with 24 hour  supervision.  Anticipate patient will benefit from follow up [place] at discharge.  PT - End of  Session Activity Tolerance: Tolerates 10 - 20 min activity with multiple rests Endurance Deficit: Yes Endurance Deficit Description: decreased, repeatedly states that he is fatigued PT Assessment Rehab Potential (ACUTE/IP ONLY): Excellent PT Barriers to Discharge: Behavior PT Barriers to Discharge Comments: cognitive deficits PT Patient demonstrates impairments in the following area(s): Balance;Behavior;Endurance;Motor;Safety;Perception;Sensory PT Transfers Functional Problem(s): Bed Mobility;Bed to Chair;Car;Furniture;Floor PT Locomotion Functional Problem(s): Stairs;Wheelchair Mobility;Ambulation PT Plan PT Intensity: Minimum of 1-2 x/day ,45 to 90 minutes PT Frequency: 5 out of 7 days PT Duration Estimated Length of Stay: 12-14 days PT Treatment/Interventions: Ambulation/gait training;Disease management/prevention;Pain management;Stair training;Visual/perceptual remediation/compensation;Therapeutic Activities;Wheelchair propulsion/positioning;DME/adaptive equipment instruction;Balance/vestibular training;Patient/family education;Cognitive remediation/compensation;Functional electrical stimulation;Psychosocial support;Therapeutic Exercise;UE/LE Strength taining/ROM;Skin care/wound management;Functional mobility training;Community reintegration;Discharge planning;Neuromuscular re-education;Splinting/orthotics;UE/LE Coordination activities PT Transfers Anticipated Outcome(s): supervision PT Locomotion Anticipated Outcome(s): supervision gait PT Recommendation Follow Up Recommendations: Home health PT Patient destination: Home Equipment Recommended: To be determined  Skilled Therapeutic Intervention  Pt in supine and agreeable to therapy, denies pain. Wife present at beginning of session, briefly answered d/c plan questions and then had to leave, did not return while therapist was with pt. Pt instructed patient in PT Evaluation and initiated treatment intervention; see below for results. Ran  out of time towards end of session 2/2 issues w/ pt's IV, RN present and aware. Pt educated patient in [place], rehab potential, rehab goals, and discharge recommendations. Will update wife and/or family as they are present. Ended session in care of NT.   PT Evaluation Precautions/Restrictions Precautions Precautions: Fall Restrictions Weight Bearing Restrictions: No General   Vital SignsTherapy Vitals Temp: 98.4 F (36.9 C) Temp Source: Oral Pulse Rate: 71 Resp: 18 BP: 117/78 Patient Position (if appropriate): Sitting Oxygen Therapy SpO2: 100 % O2 Device: Room Air Pain Pain Assessment Pain Scale: 0-10 Pain Score: 0-No pain Home Living/Prior Functioning Home Living Available Help at Discharge: Family;Friend(s);Available 24 hours/day Type of Home: House Home Access: Stairs to enter [employer] of Steps: 2-3 Entrance Stairs-Rails: (unsure of rails, unable to provide answer) Home Layout: Two level;Able to live on main level with bedroom/bathroom Alternate Level Stairs-Number of Steps: full flight  Bathroom Shower/Tub: Holiday representative Toilet: Handicapped height Additional Comments: Pt lives with wife.  Sons are able to assist some.  They have moved bed from upstairs bedroom onto the main level   Lives With: Spouse Prior Function Level of Independence: Independent with basic ADLs;Independent with transfers;Independent with homemaking with ambulation;Independent with gait  Able to Take Stairs?: Yes Driving: Yes Vocation: Retired Comments: Pt was fully independent including community activities, and driving up until ~2/87 at which time he started to have falls and confusion.  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  Vision/Perception  Vision - Assessment Additional Comments: Unable to assess 2/2 cognition, will continue to monitor  Cognition Overall Cognitive Status: Impaired/Different from baseline Arousal/Alertness:  Awake/alert Orientation Level: Oriented X4 Attention: Selective Selective Attention: Impaired Selective Attention Impairment: Functional basic;Verbal basic Memory: Impaired Memory Impairment: Retrieval deficit;Decreased recall of new information Awareness: Impaired Awareness Impairment: Emergent impairment Problem Solving: Impaired Problem Solving Impairment: Functional basic Executive Function: (all impaired due to lower level deficits) Behaviors: Impulsive Safety/Judgment: Appears intact Comments: Mild impulsivity  Sensation Sensation Light Touch: Appears Intact(Denies any numbness/tingling in LEs) Coordination Gross Motor Movements are Fluid and Coordinated: No Fine Motor Movements are Fluid and Coordinated: No Motor  Motor Motor: Other (comment);Within Functional Limits Motor -  Skilled Clinical Observations: generalized weakness  Mobility Bed Mobility Bed Mobility: Rolling Right;Rolling Left;Supine to Sit;Sit to Supine Rolling Right: 5: Supervision Rolling Right Details: Verbal cues for technique;Verbal cues for precautions/safety Rolling Left: 5: Supervision Rolling Left Details: Verbal cues for technique;Verbal cues for precautions/safety Supine to Sit: 5: Supervision Supine to Sit Details: Verbal cues for technique;Verbal cues for precautions/safety Sit to Supine: 5: Supervision Sit to Supine - Details: Verbal cues for technique;Verbal cues for precautions/safety Transfers Transfers: Yes Sit to Stand: 4: Min assist Sit to Stand Details: Manual facilitation for weight shifting;Verbal cues for technique;Verbal cues for precautions/safety Stand Pivot Transfers: 4: Min assist Stand Pivot Transfer Details: Verbal cues for technique;Verbal cues for precautions/safety;Manual facilitation for weight shifting;Manual facilitation for placement Locomotion  Ambulation Ambulation: Yes Ambulation/Gait Assistance: 4: Min assist Ambulation Distance (Feet): 75 Feet Assistive  device: Rolling walker Ambulation/Gait Assistance Details: Verbal cues for safe use of DME/AE;Verbal cues for technique;Verbal cues for precautions/safety;Verbal cues for gait pattern Gait Gait: Yes Gait Pattern: Impaired Gait Pattern: Trunk flexed;Festinating;Shuffle Gait velocity: decreased Stairs / Additional Locomotion Stairs: Yes Stairs Assistance: 4: Min assist Stairs Assistance Details: Verbal cues for technique;Verbal cues for precautions/safety;Manual facilitation for weight shifting;Verbal cues for gait pattern Stair Management Technique: Two rails Number of Stairs: 4 Height of Stairs: 6 Wheelchair Mobility Wheelchair Mobility: Yes Wheelchair Assistance: 5: Investment banker, operational Details: Verbal cues for technique;Verbal cues for Information systems manager: Both upper extremities Wheelchair Parts Management: Needs assistance Distance: 23'  Trunk/Postural Assessment  Cervical Assessment Cervical Assessment: Within Functional Limits Thoracic Assessment Thoracic Assessment: Within Functional Limits Lumbar Assessment Lumbar Assessment: Within Functional Limits Postural Control Postural Control: Deficits on evaluation(delayed)  Balance Balance Balance Assessed: Yes Dynamic Sitting Balance Sitting balance - Comments: leans back, can sit with S if hands on chair Static Standing Balance Static Standing - Balance Support: No upper extremity supported;During functional activity Static Standing - Level of Assistance: 4: Min assist Static Standing - Comment/# of Minutes: touching A; posterior bias Dynamic Standing Balance Dynamic Standing - Balance Support: No upper extremity supported;During functional activity Dynamic Standing - Level of Assistance: 4: Min assist Extremity Assessment  RUE Assessment RUE Assessment: Exceptions to [facility] but decreased coordination) LUE Assessment LUE Assessment: Exceptions to [facility] WFL, generalized  weakness and decreased coordination) RLE Assessment RLE Assessment: Exceptions to WFL(3+/5 hip flexion, 4/5 knee and ankle ms) LLE Assessment LLE Assessment: Exceptions to WFL(4- to 4/5 globally)   See Function Navigator for Current Functional Status.   Refer to Care Plan for Long Term Goals  Recommendations for other services: None   Discharge Criteria: Patient will be discharged from PT if patient refuses treatment 3 consecutive times without medical reason, if treatment goals not met, if there is a change in medical status, if patient makes no progress towards goals or if patient is discharged from hospital.  The above assessment, treatment plan, treatment alternatives and goals were discussed and mutually agreed upon: by patient  [name] 08/19/2017, 5:28 PM

## 2017-08-19 NOTE — Evaluation (Signed)
Speech Language Pathology Assessment and Plan  Patient Details  Name: [name] MRN: [id number] Date of Birth: [birth year]  SLP Diagnosis: Cognitive Impairments  Rehab Potential: Good ELOS: 14-16 days     Today's Date: 08/19/2017 SLP Individual Time: 1448-1856 SLP Individual Time Calculation (min): 55 min   Problem List:  Patient Active Problem List   Diagnosis Date Noted  . Encephalopathy 08/18/2017  . Fever   . SIADH (syndrome of inappropriate ADH production) ([place])   . PAF (paroxysmal atrial fibrillation) ([place])   . Type 2 diabetes mellitus with peripheral neuropathy (HCC)   . Hypokalemia   . Urinary retention   . Acute blood loss anemia   . Hypothyroidism   . Spinal stenosis 08/12/2017  . 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 Type 2 diabetes mellitus with other specified complication ([place]) 31/49/7026  . Hyponatremia 08/12/2017  . Atrial fibrillation ([place]) 01/16/2016  . Rheumatoid arthritis ([place]) 01/16/2016  . History of artificial joint 12/19/2014  . H/O knee surgery 12/19/2014  . Primary osteoarthritis of both knees 10/27/2014  . Osteopenia 03/31/2014  . HEARTBURN 09/04/2009   Past Medical History:  Past Medical History:  Diagnosis Date  . DM (diabetes mellitus) ([place])   . GERD (gastroesophageal reflux disease)   . Hyperlipidemia   . Hypertension   . Legionella pneumonia ([place])   . Osteoarthritis   . Osteopenia   . Rheumatoid arthritis ([place])   . Thyroid disease    Past Surgical History:  Past Surgical History:  Procedure Laterality Date  . COLONOSCOPY      Assessment / Plan / Recommendation Clinical Impression   [name] is a 77 y.o. 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 He was discharged to home but continued to worsen with onset of sundowning and was admitted 08/11/17 for  work up. Family reported gradual memory decline which has been worse in the past few weeks to months. CT head reviewed, unremarkable for acute intracranial process. Per report, chronic microangiopathic ischemic changes of periventricular and subcortical white matter with cerebral atrophy. He was noted to be hyponatremic with Na-126. CXR negative. UDS negative. Respiratory panel negative. Patient noted to be somnolent with significant cognitive deficits.  Neurology consulted and felt that patient with metabolic encephalopathy due to acute hyponatremia, possible ETOH withdrawal v/s Wernicke's encephalopathy v/s underlying dementia or sensory ataxia. He has history of daily alcohol use and was started on IV thiamine. He developed fever of 102 on 3/25, was pan cultured and was started on IV Zosyn for likely aspiration LLL PNA. Foley placed briefly for urinary retention. MRI thoracic spine showed mild degenerative spondylosis but no acute abnormality. mentation improving and urinary retention has resolved. Therapy ongoing and CIR recommended due to functional decline.  SLP evaluation was completed on 08/19/2017 with the following results:  Pt presents with moderately severe cognitive deficits affecting selective attention, problem solving, emergent awareness, and memory.  As a result, pt needed max assist on almost all subtests of the CLQT standardized cognitive assessment.  Given the abovementioned deficits, pt would benefit from skilled ST while inpatient in order to maximize functional independence and reduce burden of care prior to discharge.  Anticipate that pt will need 24/7 supervision in addition to [place] follow up at next level of care.       Skilled Therapeutic Interventions  Cognitive-linguistic evaluation completed with results and recommendations reviewed with patient and family.     SLP Assessment  Patient will need skilled [place] [facility] Services during CIR admission     Recommendations  Recommendations for Other Services: Neuropsych consult Patient destination: Home Follow up Recommendations: 24 hour supervision/assistance;Home Health SLP;Outpatient SLP Equipment Recommended: None recommended by SLP    SLP Frequency 3 to 5 out of 7 days   SLP Duration  SLP Intensity  SLP Treatment/Interventions 14-16 days   Minumum of 1-2 x/day, 30 to 90 minutes  Cognitive remediation/compensation;Cueing hierarchy;Functional tasks;Patient/family education;Internal/external aids;Environmental controls    Pain Pain Assessment Pain Scale: 0-10 Pain Score: 0-No pain Pain Location: Back  Prior Functioning Cognitive/Linguistic Baseline: Within functional limits Type of Home: House  Lives With: Spouse Available Help at Discharge: Family;Friend(s);Available 24 hours/day Education: PhD Vocation: Retired  Function:  [place] assist level: Follows basic conversation/direction with extra time/assistive device  Expression   Expression assist level: Expresses basic needs/ideas: With extra time/assistive device  Social Interaction Social Interaction assist level: Interacts appropriately 50 - 74% of the time - May be physically or verbally inappropriate.  Problem Solving Problem solving assist level: Solves basic 25 - 49% of the time - needs direction more than half the time to initiate, plan or complete simple activities  Memory Memory assist level: Recognizes or recalls 25 - 49% of the time/requires cueing 50 - 75% of the time   Short Term Goals: Week 1: SLP Short Term Goal 1 (Week 1): Pt will utilize external memory aids to facilitate recall of daily information with mod assist verbal cues.   SLP Short Term Goal 2 (Week 1): Pt will recognize and correct errors in the moment during basic, famiiliar tasks with mod assist verbal cues.  SLP Short Term Goal 3 (Week 1): Pt will complete basic, familiar  tasks with mod assist verbal cues for functional problem solving.   SLP Short Term Goal 4 (Week 1): Pt will selectively attend to tasks in a mildly distracting environment for 15 minutes with min verbal cues for redirection.    Refer to Care Plan for Long Term Goals  Recommendations for other services: Neuropsych  Discharge Criteria: Patient will be discharged from SLP if patient refuses treatment 3 consecutive times without medical reason, if treatment goals not met, if there is a change in medical status, if patient makes no progress towards goals or if patient is discharged from hospital.  The above assessment, treatment plan, treatment alternatives and goals were discussed and mutually agreed upon: by patient and by family  Emilio Math 08/19/2017, 4:27 PM

## 2017-08-19 NOTE — Progress Notes (Signed)
Subjective/Complaints:   Objective: Vital Signs: Blood pressure 129/68, pulse 85, temperature 97.9 F (36.6 C), temperature source Oral, resp. rate 18, height 5' 7" (1.702 m), weight 70.7 kg (155 lb 14.4 oz), SpO2 98 %. No results found. Results for orders placed or performed during the hospital encounter of 08/18/17 (from the past 72 hour(s))  Glucose, capillary     Status: Abnormal   Collection Time: 08/18/17  6:07 PM  Result Value Ref Range   Glucose-Capillary 148 (H) 65 - 99 mg/dL  Glucose, capillary     Status: Abnormal   Collection Time: 08/18/17  9:24 PM  Result Value Ref Range   Glucose-Capillary 164 (H) 65 - 99 mg/dL  CBC WITH DIFFERENTIAL     Status: Abnormal   Collection Time: 08/19/17  6:40 AM  Result Value Ref Range   WBC 8.8 4.0 - 10.5 K/uL    Comment: WHITE COUNT CONFIRMED ON SMEAR   RBC 3.58 (L) 4.22 - 5.81 MIL/uL   Hemoglobin 11.9 (L) 13.0 - 17.0 g/dL   HCT 33.2 (L) 39.0 - 52.0 %   MCV 92.7 78.0 - 100.0 fL   MCH 33.2 26.0 - 34.0 pg   MCHC 35.8 30.0 - 36.0 g/dL   RDW 14.2 11.5 - 15.5 %   Platelets 228 150 - 400 K/uL    Comment: PLATELET COUNT CONFIRMED BY SMEAR SPECIMEN CHECKED FOR CLOTS    Neutrophils Relative % 51 %   Lymphocytes Relative 28 %   Monocytes Relative 15 %   Eosinophils Relative 5 %   Basophils Relative 1 %   Neutro Abs 4.5 1.7 - 7.7 K/uL   Lymphs Abs 2.5 0.7 - 4.0 K/uL   Monocytes Absolute 1.3 (H) 0.1 - 1.0 K/uL   Eosinophils Absolute 0.4 0.0 - 0.7 K/uL   Basophils Absolute 0.1 0.0 - 0.1 K/uL   WBC Morphology ATYPICAL LYMPHOCYTES     Comment: Performed at [facility], [address] [place], [place] [address]  Comprehensive metabolic panel     Status: Abnormal   Collection Time: 08/19/17  6:40 AM  Result Value Ref Range   Sodium 134 (L) 135 - 145 mmol/L   Potassium 4.0 3.5 - 5.1 mmol/L   Chloride 106 101 - 111 mmol/L   CO2 18 (L) 22 - 32 mmol/L   Glucose, Bld 100 (H) 65 - 99 mg/dL   BUN 5 (L) 6 - 20 mg/dL   Creatinine, Ser  0.79 0.61 - 1.24 mg/dL   Calcium 8.3 (L) 8.9 - 10.3 mg/dL   Total Protein 5.1 (L) 6.5 - 8.1 g/dL   Albumin 2.1 (L) 3.5 - 5.0 g/dL   AST 49 (H) 15 - 41 U/L   ALT 21 17 - 63 U/L   Alkaline Phosphatase 60 38 - 126 U/L   Total Bilirubin 1.7 (H) 0.3 - 1.2 mg/dL   GFR calc non Af Amer >60 >60 mL/min   GFR calc Af Amer >60 >60 mL/min    Comment: (NOTE) The eGFR has been calculated using the CKD EPI equation. This calculation has not been validated in all clinical situations. eGFR's persistently <60 mL/min signify possible Chronic Kidney Disease.    Anion gap 10 5 - 15    Comment: Performed at [facility] [address] [place], [place] [address]  Glucose, capillary     Status: Abnormal   Collection Time: 08/19/17  7:30 AM  Result Value Ref Range   Glucose-Capillary 108 (H) 65 - 99  mg/dL     HEENT: normal Cardio: RRR and No murmurs Resp: CTA B/L and Unlabored GI: BS positive and Nontender nondistended Extremity:  Pulses positive and No Edema Skin:   Intact Neuro: Confused, Abnormal Sensory Difficult to assess secondary to poor attention, Abnormal Motor 4/5 bilateral deltoid bicep tricep grip hip flexor knee extensor ankle dorsiflexor and Other Patient is oriented to person and time but not place, patient is perseverative Musc/Skel:  Other No pain with upper extremity or lower extremity range of motion General no acute distress   Assessment/Plan: 1. Functional deficits secondary to metabolic encephalopathy and deconditioning which require 3+ hours per day of interdisciplinary therapy in a comprehensive inpatient rehab setting. Physiatrist is providing close team supervision and 24 hour management of active medical problems listed below. Physiatrist and rehab team continue to assess barriers to discharge/monitor patient progress toward functional and medical goals. FIM:                                  Medical Problem List and Plan:  1. Functional and  cognitive/mobility deficits secondary to multi-factorial gait disorder, metabolic encephalopathy  CIR PT OT speech 2. DVT Prophylaxis/Anticoagulation: Pharmaceutical: Other (comment) Eliquis  3. Pain Management: pt with hx of RA and lumbar spondylosis/ radiculopathy, OA knees  -tylenol prn  -voltaren gel prn  -kpad for back  -observe tolerance of activities with therapy  4. Mood: LCSW to follow for evaluation and support.  5. Neuropsych: This patient is not fully capable of making decisions on his own behalf.  6. Skin/Wound Care: routine pressure relief measures.  7. Fluids/Electrolytes/Nutrition: Monitor I/O. Check lytes in am.  8. Aspiration PNA: On Zosyn D#5  9. Hyponatremia: Felt to be due to PNA--resolving.  10 PAF: On Eliquis, metoprolol and tambocor (lower dose per cards). Monitor HR bid.  11. BPH: off Flomax due to orthostasis? Monitor for voiding for now.  12.  Transaminasemia mild elevation question EtOH related 13.  Hypoalbuminemia start supplement LOS (Days) 1 A FACE TO FACE EVALUATION WAS PERFORMED  Charlett Blake 08/19/2017, 11:29 AM

## 2017-08-19 NOTE — Evaluation (Signed)
Occupational Therapy Assessment and Plan  Patient Details  Name: [name] MRN: [id number] Date of Birth: [birth year]  OT Diagnosis: cognitive deficits, lumbago (low back pain), muscle weakness (generalized) and coordination disorder Rehab Potential:   ELOS: 14-16   Today's Date: 08/19/2017 OT Individual Time: 5248-1859 OT Individual Time Calculation (min): 75 min     Problem List:  Patient Active Problem List   Diagnosis Date Noted  . Encephalopathy 08/18/2017  . Fever   . SIADH (syndrome of inappropriate ADH production) ([place])   . PAF (paroxysmal atrial fibrillation) ([place])   . Type 2 diabetes mellitus with peripheral neuropathy (H[facility])   . Hypokalemia   . Urinary retention   . Acute blood loss anemia   . Hypothyroidism   . Spinal stenosis 08/12/2017  . 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 Type 2 diabetes mellitus with other specified complication ([place]) 09/31/1216  . Hyponatremia 08/12/2017  . Atrial fibrillation ([place]) 01/16/2016  . Rheumatoid arthritis ([place]) 01/16/2016  . History of artificial joint 12/19/2014  . H/O knee surgery 12/19/2014  . Primary osteoarthritis of both knees 10/27/2014  . Osteopenia 03/31/2014  . HEARTBURN 09/04/2009    Past Medical History:  Past Medical History:  Diagnosis Date  . DM (diabetes mellitus) ([place])   . GERD (gastroesophageal reflux disease)   . Hyperlipidemia   . Hypertension   . Legionella pneumonia ([place])   . Osteoarthritis   . Osteopenia   . Rheumatoid arthritis ([place])   . Thyroid disease    Past Surgical History:  Past Surgical History:  Procedure Laterality Date  . COLONOSCOPY      Assessment & Plan Clinical Impression: [name] is a 77 y.o. 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 He was discharged to home but continued to worsen with onset  of sundowning and was admitted 08/11/17 for work up. Family reported gradual memory decline which has been worse in the past few weeks to months. CT head reviewed, unremarkable for acute intracranial process. Per report, chronic microangiopathic ischemic changes of periventricular and subcortical white matter with cerebral atrophy. He was noted to be hyponatremic with Na-126. CXR negative. UDS negative. Respiratory panel negative. Patient noted to be somnolent with significant cognitive deficits.  Neurology consulted and felt that patient with metabolic encephalopathy due to acute hyponatremia, possible ETOH withdrawal v/s Wernicke's encephalopathy v/s underlying dementia or sensory ataxia. He has history of daily alcohol use and was started on IV thiamine. He developed fever of 102 on 3/25, was pan cultured and was started on IV Zosyn for likely aspiration LLL PNA. Foley placed briefly for urinary retention. MRI thoracic spine showed mild degenerative spondylosis but no acute abnormality. mentation improving and urinary retention has resolved. Therapy ongoing and CIR recommended due to functional decline  Patient currently requires mod-max with basic self-care skills secondary to muscle weakness, decreased cardiorespiratoy endurance, decreased attention, decreased awareness, decreased problem solving, decreased safety awareness and decreased memory and decreased sitting balance, decreased standing balance, decreased postural control and decreased balance strategies.  Prior to hospitalization, patient could complete BADLs/IADLs with independent .  Patient will benefit from skilled intervention to decrease level of assist with basic self-care skills and increase independence with basic self-care skills prior to discharge home with care partner.  Anticipate patient will require 24 hour supervision and follow up home health and follow up  outpatient.  OT - End of Session Activity Tolerance: Tolerates 30+ min  activity with multiple rests Endurance Deficit: Yes Endurance Deficit Description: decreased, repeatedly states that he is fatigued OT Assessment OT Patient demonstrates impairments in the following area(s): Balance;Cognition;Endurance;Motor;Pain;Safety;Skin Integrity OT Basic ADL's Functional Problem(s): Grooming;Bathing;Dressing;Toileting OT Transfers Functional Problem(s): Toilet;Tub/Shower OT Additional Impairment(s): None OT Plan OT Intensity: Minimum of 1-2 x/day, 45 to 90 minutes OT Frequency: 5 out of 7 days OT Duration/Estimated Length of Stay: 14-16 OT Treatment/Interventions: Balance/vestibular training;Discharge planning;Pain management;Self Care/advanced ADL retraining;Therapeutic Activities;UE/LE Coordination activities;Cognitive remediation/compensation;Disease mangement/prevention;Functional mobility training;Patient/family education;Skin care/wound managment;Therapeutic Exercise;Visual/perceptual remediation/compensation;Community reintegration;DME/adaptive equipment instruction;Neuromuscular re-education;Psychosocial support;UE/LE Strength taining/ROM OT Basic Self-Care Anticipated Outcome(s): S OT Toileting Anticipated Outcome(s): S OT Bathroom Transfers Anticipated Outcome(s): S OT Recommendation Patient destination: Home Follow Up Recommendations: Home health OT Equipment Recommended: To be determined   Skilled Therapeutic Intervention 1:1. Pt and wife present throughout session. Pt reporting 8/10 pain in back, but states, "it is always like that." Pt completes supine>sittin EOB with touching A for trunk elevation. Pt completes stand pivot transfer with RW with MOD A from low bed and VC for anterior weight shifting EOB>w/c>BSC>TTB>w/c>EOB. Subsequent transfers decreasing in assistance to min A. Pt bathes at sit to stand level with VC for sequencing application of soap to wash cloth, bathing body parts and termination of rinsing. Pt requires A to wash buttocks and B feet.  Pt dresses at sit to stand level with A to thread BLE into pants/underwear and don socks. Pt dons pull over shirt with supervision and button up shirt in standing to challenge balance while buttoning. Pt requires increased time and eventually needs to sit to complete final buttons d/t decreased FMC/endurance. Pt brushes teeth in sitting at sink. Exited session with pt semi reclined in bed, call light in reach and all needs met  OT Evaluation Precautions/Restrictions  Precautions Precautions: Fall Precaution Comments: up with assist only Restrictions Weight Bearing Restrictions: No General Chart Reviewed: Yes Vital Signs   Pain Pain Assessment Pain Score: 8  Pain Location: Back Home Living/Prior Functioning Home Living Family/patient expects to be discharged to:: Private residence Living Arrangements: Spouse/significant other Available Help at Discharge: Family, Friend(s), Available PRN/intermittently, Available 24 hours/day Type of Home: House Home Access: Stairs to enter [employer] of Steps: 2-3 Home Layout: Two level, Able to live on main level with bedroom/bathroom Alternate Level Stairs-Number of Steps: full flight  Bathroom Shower/Tub: Walk-in shower Additional Comments: Pt lives with wife.  Sons are able to assist some.  They have moved bed from upstairs bedroom onto the main level  IADL History Homemaking Responsibilities: Yes Meal Prep Responsibility: Secondary Laundry Responsibility: Secondary Cleaning Responsibility: Secondary Bill Paying/Finance Responsibility: Secondary Shopping Responsibility: Secondary Current License: Yes Mode of Transportation: Car Occupation: Retired Type of Occupation: statistics Prior Function Comments: Pt was fully independent including community activities, and driving up until ~2/26 at which time he started to have falls and confusion.  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 [place] Baseline Vision/History: Wears glasses Wears Glasses: At all times Patient Visual Report: No change from baseline Perception  Perception: Within Functional Limits Praxis Praxis: Intact Cognition Arousal/Alertness: Awake/alert Year: 2019 Month: March Day of Week: Incorrect Memory: Impaired Immediate Memory Recall: Sock;Blue;Bed Memory Recall: Blue Memory Recall Blue: With Cue Safety/Judgment: Impaired Sensation Sensation Light Touch: Appears Intact(reports numbness in B knees) Coordination Gross Motor Movements are Fluid and Coordinated: No Fine Motor Movements are Fluid and Coordinated:  No Motor  Motor Motor: Other (comment);Within Functional Limits Motor - Skilled Clinical Observations: generalized weakness Mobility  Transfers Transfers: Sit to Stand Sit to Stand: 3: Mod assist Sit to Stand Details: Manual facilitation for weight shifting;Visual cues/gestures for precautions/safety;Verbal cues for technique;Visual cues for safe use of DME/AE  Trunk/Postural Assessment  Cervical Assessment Cervical Assessment: Within Functional Limits Thoracic Assessment Thoracic Assessment: Within Functional Limits Lumbar Assessment Lumbar Assessment: Within Functional Limits Postural Control Postural Control: Deficits on evaluation(delayed)  Balance Balance Balance Assessed: Yes Dynamic Sitting Balance Sitting balance - Comments: leans back, can sit with S if hands on chair Static Standing Balance Static Standing - Level of Assistance: 4: Min assist Static Standing - Comment/# of Minutes: touching A; posterior bias Extremity/Trunk Assessment RUE Assessment RUE Assessment: Exceptions to [facility] but decreased coordination) LUE Assessment LUE Assessment: Exceptions to [facility] WFL, generalized weakness and decreased coordination)   See Function Navigator for Current Functional Status.   Refer to Care Plan for Long Term Goals  Recommendations for other  services: Therapeutic Recreation  Pet therapy, Kitchen group and Outing/community reintegration   Discharge Criteria: Patient will be discharged from OT if patient refuses treatment 3 consecutive times without medical reason, if treatment goals not met, if there is a change in medical status, if patient makes no progress towards goals or if patient is discharged from hospital.  The above assessment, treatment plan, treatment alternatives and goals were discussed and mutually agreed upon: by patient and by family  Tonny Branch 08/19/2017, 2:18 PM

## 2017-08-20 ENCOUNTER — Inpatient Hospital Stay (HOSPITAL_COMMUNITY): Payer: Medicare Other

## 2017-08-20 LAB — GLUCOSE, CAPILLARY
GLUCOSE-CAPILLARY: 116 mg/dL — AB (ref 65–99)
GLUCOSE-CAPILLARY: 96 mg/dL (ref 65–99)
GLUCOSE-CAPILLARY: 80 mg/dL (ref 65–99)
GLUCOSE-CAPILLARY: 130 mg/dL — AB (ref 65–99)
Glucose-Capillary: 211 mg/dL — ABNORMAL HIGH (ref 65–99)

## 2017-08-20 NOTE — Progress Notes (Signed)
Subjective/Complaints:  No new issues per son.  Nursing notes problems with confusion. Discussed lab work with patient's son who is a physician Review of systems denies chest pain shortness of breath nausea vomiting.  Objective: Vital Signs: Blood pressure 114/72, pulse 69, temperature 98.3 F (36.8 C), temperature source Oral, resp. rate 17, height 5' 7"  (1.702 m), weight 70.7 kg (155 lb 14.4 oz), SpO2 97 %. No results found. Results for orders placed or performed during the hospital encounter of 08/18/17 (from the past 72 hour(s))  Glucose, capillary     Status: Abnormal   Collection Time: 08/18/17  6:07 PM  Result Value Ref Range   Glucose-Capillary 148 (H) 65 - 99 mg/dL  Glucose, capillary     Status: Abnormal   Collection Time: 08/18/17  9:24 PM  Result Value Ref Range   Glucose-Capillary 164 (H) 65 - 99 mg/dL  CBC WITH DIFFERENTIAL     Status: Abnormal   Collection Time: 08/19/17  6:40 AM  Result Value Ref Range   WBC 8.8 4.0 - 10.5 K/uL    Comment: WHITE COUNT CONFIRMED ON SMEAR   RBC 3.58 (L) 4.22 - 5.81 MIL/uL   Hemoglobin 11.9 (L) 13.0 - 17.0 g/dL   HCT 33.2 (L) 39.0 - 52.0 %   MCV 92.7 78.0 - 100.0 fL   MCH 33.2 26.0 - 34.0 pg   MCHC 35.8 30.0 - 36.0 g/dL   RDW 14.2 11.5 - 15.5 %   Platelets 228 150 - 400 K/uL    Comment: PLATELET COUNT CONFIRMED BY SMEAR SPECIMEN CHECKED FOR CLOTS    Neutrophils Relative % 51 %   Lymphocytes Relative 28 %   Monocytes Relative 15 %   Eosinophils Relative 5 %   Basophils Relative 1 %   Neutro Abs 4.5 1.7 - 7.7 K/uL   Lymphs Abs 2.5 0.7 - 4.0 K/uL   Monocytes Absolute 1.3 (H) 0.1 - 1.0 K/uL   Eosinophils Absolute 0.4 0.0 - 0.7 K/uL   Basophils Absolute 0.1 0.0 - 0.1 K/uL   WBC Morphology ATYPICAL LYMPHOCYTES     Comment: Performed at [facility], [address], [place], [place] [address]  Comprehensive metabolic panel     Status: Abnormal   Collection Time: 08/19/17  6:40 AM  Result Value Ref Range   Sodium 134 (L)  135 - 145 mmol/L   Potassium 4.0 3.5 - 5.1 mmol/L   Chloride 106 101 - 111 mmol/L   CO2 18 (L) 22 - 32 mmol/L   Glucose, Bld 100 (H) 65 - 99 mg/dL   BUN 5 (L) 6 - 20 mg/dL   Creatinine, Ser 0.79 0.61 - 1.24 mg/dL   Calcium 8.3 (L) 8.9 - 10.3 mg/dL   Total Protein 5.1 (L) 6.5 - 8.1 g/dL   Albumin 2.1 (L) 3.5 - 5.0 g/dL   AST 49 (H) 15 - 41 U/L   ALT 21 17 - 63 U/L   Alkaline Phosphatase 60 38 - 126 U/L   Total Bilirubin 1.7 (H) 0.3 - 1.2 mg/dL   GFR calc non Af Amer >60 >60 mL/min   GFR calc Af Amer >60 >60 mL/min    Comment: (NOTE) The eGFR has been calculated using the CKD EPI equation. This calculation has not been validated in all clinical situations. eGFR's persistently <60 mL/min signify possible Chronic Kidney Disease.    Anion gap 10 5 - 15    Comment: Performed at [facility] [place],  [place] [address]  Glucose, capillary     Status: Abnormal   Collection Time: 08/19/17  7:30 AM  Result Value Ref Range   Glucose-Capillary 108 (H) 65 - 99 mg/dL  Glucose, capillary     Status: Abnormal   Collection Time: 08/19/17 11:38 AM  Result Value Ref Range   Glucose-Capillary 214 (H) 65 - 99 mg/dL  Glucose, capillary     Status: None   Collection Time: 08/19/17  4:43 PM  Result Value Ref Range   Glucose-Capillary 97 65 - 99 mg/dL  Glucose, capillary     Status: Abnormal   Collection Time: 08/19/17  9:40 PM  Result Value Ref Range   Glucose-Capillary 116 (H) 65 - 99 mg/dL  Glucose, capillary     Status: None   Collection Time: 08/20/17  7:13 AM  Result Value Ref Range   Glucose-Capillary 96 65 - 99 mg/dL     HEENT: normal Cardio: RRR and No murmurs Resp: CTA B/L and Unlabored GI: BS positive and Nontender nondistended Extremity:  Pulses positive and No Edema Skin:   Intact Neuro: Confused, Abnormal Sensory Difficult to assess secondary to poor attention, Abnormal Motor 4/5 bilateral deltoid bicep tricep grip hip flexor knee extensor ankle  dorsiflexor and Other Patient is oriented to person and time but not place, patient is perseverative Musc/Skel:  Other No pain with upper extremity or lower extremity range of motion General no acute distress   Assessment/Plan: 1. Functional deficits secondary to metabolic encephalopathy and deconditioning which require 3+ hours per day of interdisciplinary therapy in a comprehensive inpatient rehab setting. Physiatrist is providing close team supervision and 24 hour management of active medical problems listed below. Physiatrist and rehab team continue to assess barriers to discharge/monitor patient progress toward functional and medical goals. FIM: Function - Bathing Body parts bathed by patient: Right arm, Left arm, Chest, Abdomen, Front perineal area, Right upper leg, Left upper leg Body parts bathed by helper: Buttocks, Back, Left lower leg, Right lower leg Assist Level: Touching or steadying assistance(Pt > 75%)  Function- Upper Body Dressing/Undressing What is the patient wearing?: Pull over shirt/dress, Button up shirt Pull over shirt/dress - Perfomed by patient: Thread/unthread right sleeve, Thread/unthread left sleeve, Put head through opening, Pull shirt over trunk Button up shirt - Perfomed by patient: Thread/unthread right sleeve, Thread/unthread left sleeve, Pull shirt around back, Button/unbutton shirt Assist Level: Touching or steadying assistance(Pt > 75%)(standing for button up shirt) Function - Lower Body Dressing/Undressing What is the patient wearing?: Non-skid slipper socks, Pants, Underwear Underwear - Performed by helper: Thread/unthread right underwear leg, Thread/unthread left underwear leg, Pull underwear up/down Pants- Performed by helper: Thread/unthread right pants leg, Thread/unthread left pants leg, Pull pants up/down Non-skid slipper socks- Performed by helper: Don/doff right sock, Don/doff left sock Assist for footwear: Dependant Assist for lower body  dressing: (total)  Function - Toileting Toileting steps completed by patient: Adjust clothing prior to toileting Toileting steps completed by helper: Performs perineal hygiene, Adjust clothing after toileting Assist level: (MAX A)  Function - Toilet Transfers Toilet transfer assistive device: Elevated toilet seat/BSC over toilet Assist level to toilet: Moderate assist (Pt 50 - 74%/lift or lower) Assist level from toilet: Touching or steadying assistance (Pt > 75%)  Function - Chair/bed transfer Chair/bed transfer method: Stand pivot Chair/bed transfer assist level: Touching or steadying assistance (Pt > 75%) Chair/bed transfer assistive device: Armrests, Bedrails Chair/bed transfer details: Verbal cues for technique, Verbal cues for precautions/safety, Verbal cues for sequencing  Function -  Locomotion: Wheelchair Will patient use wheelchair at discharge?: (TBD) Type: Manual Max wheelchair distance: 45' Assist Level: Supervision or verbal cues Assist Level: Supervision or verbal cues Wheel 150 feet activity did not occur: Safety/medical concerns Turns around,maneuvers to table,bed, and toilet,negotiates 3% grade,maneuvers on rugs and over doorsills: No Function - Locomotion: Ambulation Assistive device: Walker-rolling Max distance: 95' Assist level: Touching or steadying assistance (Pt > 75%) Assist level: Touching or steadying assistance (Pt > 75%) Assist level: Touching or steadying assistance (Pt > 75%) Walk 150 feet activity did not occur: Safety/medical concerns Walk 10 feet on uneven surfaces activity did not occur: Safety/medical concerns  Function - Comprehension Comprehension: Auditory Comprehension assist level: Follows basic conversation/direction with extra time/assistive device  Function - Expression Expression: Verbal Expression assist level: Expresses basic needs/ideas: With extra time/assistive device  Function - Social Interaction Social Interaction assist  level: Interacts appropriately 50 - 74% of the time - May be physically or verbally inappropriate.  Function - Problem Solving Problem solving assist level: Solves basic 25 - 49% of the time - needs direction more than half the time to initiate, plan or complete simple activities  Function - Memory Memory assist level: Recognizes or recalls 25 - 49% of the time/requires cueing 50 - 75% of the time Patient normally able to recall (first 3 days only): That he or she is in a hospital  Medical Problem List and Plan:  1. Functional and cognitive/mobility deficits secondary to multi-factorial gait disorder, metabolic encephalopathy  CIR PT OT speech, she has some fluctuation in terms of his mental status however no progressive decline. 2. DVT Prophylaxis/Anticoagulation: Pharmaceutical: Other (comment) Eliquis no evidence of bleeding 3. Pain Management: pt with hx of RA and lumbar spondylosis/ radiculopathy, OA knees  -tylenol prn  -voltaren gel prn  -kpad for back  -observe tolerance of activities with therapy  4. Mood: LCSW to follow for evaluation and support.  5. Neuropsych: This patient is not fully capable of making decisions on his own behalf.  6. Skin/Wound Care: routine pressure relief measures.  7. Fluids/Electrolytes/Nutrition: Monitor I/O. Check lytes in am.  8. Aspiration PNA: Lung exam is normal on Zosyn  9. Hyponatremia: Felt to be due to PNA--resolving.  10 PAF: On Eliquis, metoprolol and tambocor (lower dose per cards). Monitor HR bid.  Heart rate normal at 69 today 11. BPH: off Flomax due to orthostasis? Monitor for voiding for now.  12.  Transaminasemia mild elevation question EtOH related 13.  Hypoalbuminemia start supplement LOS (Days) 2 A FACE TO FACE EVALUATION WAS PERFORMED  Charlett Blake 08/20/2017, 10:40 AM

## 2017-08-20 NOTE — Progress Notes (Signed)
More confusion and impulsiveness noted this morning, attempting to stand and walk to bathroom without calling for help, stating he had to go to a conference at [place], asking "How did I get here last night?" Does not remember me from yesterday, does not remember coming to hospital, upset by fall precautions such as safety belt and chair alarm.

## 2017-08-20 NOTE — Progress Notes (Signed)
Occupational Therapy Session Note  Patient Details  Name: [name] MRN: [id number] Date of Birth: 08-03-1940  Today's Date: 08/20/2017 OT Individual Time: 0900-1000 OT Individual Time Calculation (min): 60 min    Short Term Goals: Week 1:  OT Short Term Goal 1 (Week 1): Pt will stand at sink for 2 grooming items without seated rest break and CGA OT Short Term Goal 2 (Week 1): Pt will complete toilet transfer wiht LRAD and CGA OT Short Term Goal 3 (Week 1): Pt will thread BLE into pants wiht AE PRN and supervision OT Short Term Goal 4 (Week 1): Pt will complete clothing managment wiht touching A  Skilled Therapeutic Interventions/Progress Updates:    1;1. Pt agreeable, however not remembering this OT from evaluation yesterday. Pt agreeable to dressing and grooming at sink today. Pt stands to complete oral care and shaving at sink with seated rest break d/t fatigue. Pt able to sequence and intiate all tasks appropriatelly, however has difficulty arranging items on sink and often bumps into items. Pt completes dressing at sit to stand level at sink with CGA threading BLE into pants crossing RLE into seated figure 4. Pt dons button up shirt seated with supervision. Pt unable to don socks in seated figure 4 and OT demos use of sock aide. Pt requires MAX fading to MOD cues to use sock aide to don B socks. Exited session with pt seated in bed, exit alarm on and call light in reach.  Therapy Documentation Precautions:  Precautions Precautions: Fall Precaution Comments: up with assist only Restrictions Weight Bearing Restrictions: No  See Function Navigator for Current Functional Status.   Therapy/Group: Individual Therapy  Tonny Branch 08/20/2017, 9:58 AM

## 2017-08-21 ENCOUNTER — Inpatient Hospital Stay (HOSPITAL_COMMUNITY): Payer: Medicare Other

## 2017-08-21 ENCOUNTER — Inpatient Hospital Stay (HOSPITAL_COMMUNITY): Payer: Medicare Other | Admitting: Occupational Therapy

## 2017-08-21 ENCOUNTER — Inpatient Hospital Stay (HOSPITAL_COMMUNITY): Payer: Medicare Other | Admitting: Speech Pathology

## 2017-08-21 DIAGNOSIS — J690 Pneumonitis due to inhalation of food and vomit: Secondary | ICD-10-CM

## 2017-08-21 LAB — GLUCOSE, CAPILLARY
GLUCOSE-CAPILLARY: 90 mg/dL (ref 65–99)
Glucose-Capillary: 157 mg/dL — ABNORMAL HIGH (ref 65–99)
Glucose-Capillary: 129 mg/dL — ABNORMAL HIGH (ref 65–99)
Glucose-Capillary: 163 mg/dL — ABNORMAL HIGH (ref 65–99)

## 2017-08-21 LAB — BASIC METABOLIC PANEL
Anion gap: 9 (ref 5–15)
BUN: 5 mg/dL — AB (ref 6–20)
CHLORIDE: 106 mmol/L (ref 101–111)
CO2: 20 mmol/L — ABNORMAL LOW (ref 22–32)
CREATININE: 0.82 mg/dL (ref 0.61–1.24)
Calcium: 8.5 mg/dL — ABNORMAL LOW (ref 8.9–10.3)
GFR calc Af Amer: >60 mL/min (ref >60)
GLUCOSE: 91 mg/dL (ref 65–99)
POTASSIUM: 3.4 mmol/L — AB (ref 3.5–5.1)
SODIUM: 135 mmol/L (ref 135–145)

## 2017-08-21 LAB — VITAMIN B1: Vitamin B1 (Thiamine): 213.3 nmol/L — ABNORMAL HIGH (ref 66.5–200.0)

## 2017-08-21 MED ORDER — POTASSIUM CHLORIDE CRYS ER 20 MEQ PO TBCR
20.0000 meq | EXTENDED_RELEASE_TABLET | Freq: Every day | ORAL | Status: AC
  Administered 2017-08-23 – 2017-08-26 (×4): 20 meq via ORAL
  Filled 2017-08-21 (×4): qty 1

## 2017-08-21 MED ORDER — POTASSIUM CHLORIDE CRYS ER 20 MEQ PO TBCR
20.0000 meq | EXTENDED_RELEASE_TABLET | Freq: Two times a day (BID) | ORAL | Status: AC
  Administered 2017-08-21 – 2017-08-22 (×4): 20 meq via ORAL
  Filled 2017-08-21 (×4): qty 1

## 2017-08-21 NOTE — Progress Notes (Signed)
Occupational Therapy Session Note  Patient Details  Name: [name] MRN: [id number] Date of Birth: [birth year]  Today's Date: 08/21/2017 OT Individual Time: 1130-1155 OT Individual Time Calculation (min): 25 min    Short Term Goals: Week 1:  OT Short Term Goal 1 (Week 1): Pt will stand at sink for 2 grooming items without seated rest break and CGA OT Short Term Goal 2 (Week 1): Pt will complete toilet transfer wiht LRAD and CGA OT Short Term Goal 3 (Week 1): Pt will thread BLE into pants wiht AE PRN and supervision OT Short Term Goal 4 (Week 1): Pt will complete clothing managment wiht touching A  Skilled Therapeutic Interventions/Progress Updates:    Upon entering the room, pt sitting on EOB with RN attempting to return to bed secondary to fatigue. Pt declined remaining up for therapy session. Pt doffed B shoes with steady A for balance and performed sit >supine with steady assistance. Pt oriented x4 with min verbal guidance cues this session. OT continues to educate pt on OT purpose, POC, and goals with pt verbalizing understanding. Pt with limited insight regarding current deficits and focus of therapy. His caregiver entered the room with a few questions as well. Pt remained supine with call bell and caregiver present in room. All needs within reach.   Therapy Documentation Precautions:  Precautions Precautions: Fall Precaution Comments: up with assist only Restrictions Weight Bearing Restrictions: No     See Function Navigator for Current Functional Status.   Therapy/Group: Individual Therapy  Gypsy Decant 08/21/2017, 12:31 PM

## 2017-08-21 NOTE — Progress Notes (Signed)
Speech Language Pathology Daily Session Note  Patient Details  Name: [name] MRN: [id number] Date of Birth: [birth year]  Today's Date: 08/21/2017 SLP Individual Time: 0730-0830 SLP Individual Time Calculation (min): 60 min  Short Term Goals: Week 1: SLP Short Term Goal 1 (Week 1): Pt will utilize external memory aids to facilitate recall of daily information with mod assist verbal cues.   SLP Short Term Goal 2 (Week 1): Pt will recognize and correct errors in the moment during basic, famiiliar tasks with mod assist verbal cues.  SLP Short Term Goal 3 (Week 1): Pt will complete basic, familiar tasks with mod assist verbal cues for functional problem solving.   SLP Short Term Goal 4 (Week 1): Pt will selectively attend to tasks in a mildly distracting environment for 15 minutes with min verbal cues for redirection.    Skilled Therapeutic Interventions: Skilled treatment session focused on cognition goals. SLP facilitated session by providing education to wife on allowing pt to set-up/problem his own breakfast tray. Wife states that pt is independent but doesn't allow him to be within the functional task. She appeared receptive to education. Although pt was oriented, he frequently perseverated on confused thought processes and was difficult to redirect. Pt required Min A cues to complete basic money management tasks. Pt was returned to room,left upright in wheelchair, safety belt donned, chair alarm on and all needs within reach. Continue per current plan of care.      Function:  Eating Eating   Modified Consistency Diet: No Eating Assist Level: More than reasonable amount of time;Set up assist for   Eating Set Up Assist For: Opening containers       Cognition Comprehension Comprehension assist level: Follows basic conversation/direction with extra time/assistive device  Expression   Expression assist level: Expresses basic needs/ideas: With extra time/assistive device  Social  Interaction Social Interaction assist level: Interacts appropriately 50 - 74% of the time - May be physically or verbally inappropriate.;Interacts appropriately 75 - 89% of the time - Needs redirection for appropriate language or to initiate interaction.  Problem Solving Problem solving assist level: Solves basic 75 - 89% of the time/requires cueing 10 - 24% of the time  Memory Memory assist level: Recognizes or recalls 25 - 49% of the time/requires cueing 50 - 75% of the time;Recognizes or recalls 50 - 74% of the time/requires cueing 25 - 49% of the time    Pain Pain Assessment Pain Scale: 0-10 Pain Score: 0-No pain  Therapy/Group: Individual Therapy  Ifrah Vest 08/21/2017, 9:03 AM

## 2017-08-21 NOTE — IPOC Note (Signed)
Overall Plan of Care [facility]) Patient Details Name: [name] MRN: [id number] DOB: [birth year]  Admitting Diagnosis: Encephalopathy  Hospital Problems: Principal Problem:   Encephalopathy Active Problems:   Gait disorder   Type 2 diabetes mellitus with other specified complication (HCC)   PAF (paroxysmal atrial fibrillation) ([place])     Functional Problem List: Nursing Skin Integrity, Safety, Endurance, Medication Management, Motor  PT Balance, Behavior, Endurance, Motor, Safety, Perception, Sensory  OT Balance, Cognition, Endurance, Motor, Pain, Safety, Skin Integrity  SLP Cognition  TR         Basic ADL's: OT Grooming, Bathing, Dressing, Toileting     Advanced  ADL's: OT       Transfers: PT Bed Mobility, Bed to Chair, Car, [employer], Futures trader, Tub/Shower     Locomotion: PT Stairs, Emergency planning/management officer, Ambulation     Additional Impairments: OT None  SLP Social Cognition   Problem Solving, Memory, Attention, Awareness  TR      Anticipated Outcomes Item Anticipated Outcome  Self Feeding    Swallowing      Basic self-care  S  Toileting  S   Bathroom Transfers S  Bowel/Bladder  Pt will manage bowel and bladder with supervision assist at discharge.   Transfers  supervision  Locomotion  supervision gait  Communication     Cognition  min assist   Pain  Pt will manage pain at 3 or less on a scale of 0-10.   Safety/Judgment  Pt will remain free of falls and injury while in rehab with min assist/cues.    Therapy Plan: PT Intensity: Minimum of 1-2 x/day ,45 to 90 minutes PT Frequency: 5 out of 7 days PT Duration Estimated Length of Stay: 12-14 days OT Intensity: Minimum of 1-2 x/day, 45 to 90 minutes OT Frequency: 5 out of 7 days OT Duration/Estimated Length of Stay: 14-16 SLP Intensity: Minumum of 1-2 x/day, 30 to 90 minutes SLP Frequency: 3 to 5 out of 7 days SLP Duration/Estimated Length of Stay: 14-16 days     Team  Interventions: Nursing Interventions Patient/Family Education, Medication Management, Pain Management, Skin Care/Wound Management, Cognitive Remediation/Compensation  PT interventions Ambulation/gait training, Disease management/prevention, Pain management, Stair training, Visual/perceptual remediation/compensation, Therapeutic Activities, Wheelchair propulsion/positioning, DME/adaptive equipment instruction, Training and development officer, Patient/family education, Cognitive remediation/compensation, Functional electrical stimulation, Psychosocial support, Therapeutic Exercise, UE/LE Strength taining/ROM, Skin care/wound management, Functional mobility training, Community reintegration, Discharge planning, Neuromuscular re-education, Splinting/orthotics, UE/LE Coordination activities  OT Interventions Balance/vestibular training, Discharge planning, Pain management, Self Care/advanced ADL retraining, Therapeutic Activities, UE/LE Coordination activities, Cognitive remediation/compensation, Disease mangement/prevention, Functional mobility training, Patient/family education, Skin care/wound managment, Therapeutic Exercise, Visual/perceptual remediation/compensation, Academic librarian, Engineer, drilling, Neuromuscular re-education, Psychosocial support, UE/LE Strength taining/ROM  SLP Interventions Cognitive remediation/compensation, English as a second language teacher, Functional tasks, Patient/family education, Internal/external aids, Environmental controls  TR Interventions    SW/CM Interventions Discharge Planning, Psychosocial Support, Patient/Family Education   Barriers to Discharge MD  Medical stability  Nursing      PT Behavior cognitive deficits  OT      SLP      SW       Team Discharge Planning: Destination: PT-Home ,OT- Home , SLP-Home Projected Follow-up: PT-Home health PT, OT-  Home health OT, SLP-24 hour supervision/assistance, Home Health SLP, Outpatient SLP Projected Equipment  Needs: PT-To be determined, OT- To be determined, SLP-None recommended by SLP Equipment Details: PT- , OT-  Patient/family involved in discharge planning: PT- Patient,  OT-Patient, Family member/caregiver, SLP-Patient, Family member/caregiver  MD ELOS: 3  days Medical Rehab Prognosis:  Excellent Assessment: The patient has been admitted for CIR therapies with the diagnosis of encephalopathy/gait disorder. The team will be addressing functional mobility, strength, stamina, balance, safety, adaptive techniques and equipment, self-care, bowel and bladder mgt, patient and caregiver education, NMR, cognition, family education. Goals have been set at supervision for mobility, self-care and cognition.    Meredith Staggers, MD, FAAPMR      See Team Conference Notes for weekly updates to the plan of care

## 2017-08-21 NOTE — Progress Notes (Signed)
Physical Therapy Note  Patient Details  Name: [name] MRN: [id number] Date of Birth: 07-Dec-1940 Today's Date: 08/21/2017  0900-1015, 75 min individual tx Pain: none per pt  W/c propulsion using bil UEs on level tile x 50' before fatigueing.  neuromuscular re-education via multimodal cues for alternating reciprocal movement x 2 minutes x 2. Core strengthening in sitting with trunk flexion/extension, and bil shoulder adduction , both with multimodal cues.  Seated reaching activity to promote trunk shortening/lenthening/rotating to improve trunk righting/balance.  Reaching and rotation limited by pt's back pain, due to spinal stenosis, per pt.  Sit>< partial stand with bil hands on chair seat in front of him, blocked practice for retraining forward wt shift for sit> stand, and eccentric control for stand> sit.  Stand pivot transfers throughout session with min assist.  Toilet transfer for continent B and B.    Gait training with grocery cart x 75' on level tile, min assist and mod cues for upright trunk, forward gaze.  Gait training with RW in home setting, RW,  on level tile with min assist.   Pt left resting in w/c with quick release belt applied, alarm set and all needs within reach.  See function navigator for current status.  [name] 08/21/2017, 7:52 AM

## 2017-08-21 NOTE — Progress Notes (Signed)
Occupational Therapy Session Note  Patient Details  Name: [name] MRN: [id number] Date of Birth: 03-24-1941  Today's Date: 08/21/2017 OT Individual Time: 1350-1430 OT Individual Time Calculation (min): 40 min    Short Term Goals: Week 1:  OT Short Term Goal 1 (Week 1): Pt will stand at sink for 2 grooming items without seated rest break and CGA OT Short Term Goal 2 (Week 1): Pt will complete toilet transfer wiht LRAD and CGA OT Short Term Goal 3 (Week 1): Pt will thread BLE into pants wiht AE PRN and supervision OT Short Term Goal 4 (Week 1): Pt will complete clothing managment wiht touching A  Skilled Therapeutic Interventions/Progress Updates:    Treatment session with focus on LB dressing and dynamic standing balance.  Pt received supine in bed having just returned from x-ray.  Engaged in sit > stand with min guard and use of RW for UE support during standing balance.  Engaged in reaching activity incorporating crossing midline and outside BOS with mod cues to cross midline.  Min guard for standing balance.  Educated on use of sock aid for donning socks.  Pt able to verbalize how to use sock aid with 60% accuracy, however when attempting to utilize required max cues for orientation and sequencing of use of sock aid.  Pt reporting "defective" socks when having difficulty, demonstrating decreased awareness and problem solving.  Engaged in sidestepping with min cues for sequencing prior to returning to bed.  Left semi-reclined in bed with all needs in reach.  Therapy Documentation Precautions:  Precautions Precautions: Fall Precaution Comments: up with assist only Restrictions Weight Bearing Restrictions: No General:   Vital Signs: Therapy Vitals Temp: 98.3 F (36.8 C) Temp Source: Oral Pulse Rate: 70 Resp: 16 BP: 98/60 Patient Position (if appropriate): Lying Oxygen Therapy SpO2: 99 % O2 Device: Room Air Pain: Pain Assessment Pain Scale: 0-10 Pain Score: 0-No  pain  See Function Navigator for Current Functional Status.   Therapy/Group: Individual Therapy  Simonne Come 08/21/2017, 4:00 PM

## 2017-08-21 NOTE — Progress Notes (Signed)
Subjective/Complaints: Patient is up at sink.  Ready for therapy today.  He would like a shave.  Wife is present Confusion noted over the weekend  ROS: Patient denies fever, rash, sore throat, blurred vision, nausea, vomiting, diarrhea, cough, shortness of breath or chest pain, joint or back pain, headache, or mood change. .  Objective: Vital Signs: Blood pressure 130/76, pulse 62, temperature 98.3 F (36.8 C), temperature source Oral, resp. rate 16, height 5' 7"  (1.702 m), weight 70.7 kg (155 lb 14.4 oz), SpO2 100 %. No results found. Results for orders placed or performed during the hospital encounter of 08/18/17 (from the past 72 hour(s))  Glucose, capillary     Status: Abnormal   Collection Time: 08/18/17  6:07 PM  Result Value Ref Range   Glucose-Capillary 148 (H) 65 - 99 mg/dL  Glucose, capillary     Status: Abnormal   Collection Time: 08/18/17  9:24 PM  Result Value Ref Range   Glucose-Capillary 164 (H) 65 - 99 mg/dL  CBC WITH DIFFERENTIAL     Status: Abnormal   Collection Time: 08/19/17  6:40 AM  Result Value Ref Range   WBC 8.8 4.0 - 10.5 K/uL    Comment: WHITE COUNT CONFIRMED ON SMEAR   RBC 3.58 (L) 4.22 - 5.81 MIL/uL   Hemoglobin 11.9 (L) 13.0 - 17.0 g/dL   HCT 33.2 (L) 39.0 - 52.0 %   MCV 92.7 78.0 - 100.0 fL   MCH 33.2 26.0 - 34.0 pg   MCHC 35.8 30.0 - 36.0 g/dL   RDW 14.2 11.5 - 15.5 %   Platelets 228 150 - 400 K/uL    Comment: PLATELET COUNT CONFIRMED BY SMEAR SPECIMEN CHECKED FOR CLOTS    Neutrophils Relative % 51 %   Lymphocytes Relative 28 %   Monocytes Relative 15 %   Eosinophils Relative 5 %   Basophils Relative 1 %   Neutro Abs 4.5 1.7 - 7.7 K/uL   Lymphs Abs 2.5 0.7 - 4.0 K/uL   Monocytes Absolute 1.3 (H) 0.1 - 1.0 K/uL   Eosinophils Absolute 0.4 0.0 - 0.7 K/uL   Basophils Absolute 0.1 0.0 - 0.1 K/uL   WBC Morphology ATYPICAL LYMPHOCYTES     Comment: Performed at [facility], [address] [place], [place] [address]  Comprehensive  metabolic panel     Status: Abnormal   Collection Time: 08/19/17  6:40 AM  Result Value Ref Range   Sodium 134 (L) 135 - 145 mmol/L   Potassium 4.0 3.5 - 5.1 mmol/L   Chloride 106 101 - 111 mmol/L   CO2 18 (L) 22 - 32 mmol/L   Glucose, Bld 100 (H) 65 - 99 mg/dL   BUN 5 (L) 6 - 20 mg/dL   Creatinine, Ser 0.79 0.61 - 1.24 mg/dL   Calcium 8.3 (L) 8.9 - 10.3 mg/dL   Total Protein 5.1 (L) 6.5 - 8.1 g/dL   Albumin 2.1 (L) 3.5 - 5.0 g/dL   AST 49 (H) 15 - 41 U/L   ALT 21 17 - 63 U/L   Alkaline Phosphatase 60 38 - 126 U/L   Total Bilirubin 1.7 (H) 0.3 - 1.2 mg/dL   GFR calc non Af Amer >60 >60 mL/min   GFR calc Af Amer >60 >60 mL/min    Comment: (NOTE) The eGFR has been calculated using the CKD EPI equation. This calculation has not been validated in all clinical situations. eGFR's persistently <60 mL/min signify possible Chronic Kidney Disease.  Anion gap 10 5 - 15    Comment: Performed at [facility] [address] [place], [place] [address]  Glucose, capillary     Status: Abnormal   Collection Time: 08/19/17  7:30 AM  Result Value Ref Range   Glucose-Capillary 108 (H) 65 - 99 mg/dL  Glucose, capillary     Status: Abnormal   Collection Time: 08/19/17 11:38 AM  Result Value Ref Range   Glucose-Capillary 214 (H) 65 - 99 mg/dL  Glucose, capillary     Status: None   Collection Time: 08/19/17  4:43 PM  Result Value Ref Range   Glucose-Capillary 97 65 - 99 mg/dL  Glucose, capillary     Status: Abnormal   Collection Time: 08/19/17  9:40 PM  Result Value Ref Range   Glucose-Capillary 116 (H) 65 - 99 mg/dL  Glucose, capillary     Status: None   Collection Time: 08/20/17  7:13 AM  Result Value Ref Range   Glucose-Capillary 96 65 - 99 mg/dL  Glucose, capillary     Status: Abnormal   Collection Time: 08/20/17 12:04 PM  Result Value Ref Range   Glucose-Capillary 211 (H) 65 - 99 mg/dL  Glucose, capillary     Status: None   Collection Time: 08/20/17  4:37 PM  Result Value  Ref Range   Glucose-Capillary 80 65 - 99 mg/dL  Glucose, capillary     Status: Abnormal   Collection Time: 08/20/17  9:23 PM  Result Value Ref Range   Glucose-Capillary 130 (H) 65 - 99 mg/dL   Comment 1 Notify RN   Glucose, capillary     Status: None   Collection Time: 08/21/17  6:53 AM  Result Value Ref Range   Glucose-Capillary 90 65 - 99 mg/dL   Comment 1 Notify RN      Constitutional: No distress . Vital signs reviewed. HEENT: EOMI, oral membranes moist Cardiovascular: RRR without murmur. No JVD    Respiratory: CTA Bilaterally without wheezes or rales. Normal effort    GI: BS +, non-tender, non-distended  Extremity:  Pulses positive and No Edema Skin:   Intact Neuro: Oriented to person and place.  Told me the month and year but cannot remember the day, attention fair today.  Abnormal Motor 4/5 bilateral deltoid bicep tricep grip hip flexor knee extensor ankle dorsiflexor.  Inconsistent sensory exam  Musc/Skel:  Other No pain with upper extremity or lower extremity range of motion General no acute distress   Assessment/Plan: 1. Functional deficits secondary to metabolic encephalopathy and deconditioning which require 3+ hours per day of interdisciplinary therapy in a comprehensive inpatient rehab setting. Physiatrist is providing close team supervision and 24 hour management of active medical problems listed below. Physiatrist and rehab team continue to assess barriers to discharge/monitor patient progress toward functional and medical goals. FIM: Function - Bathing Body parts bathed by patient: Right arm, Left arm, Chest, Abdomen, Front perineal area, Right upper leg, Left upper leg Body parts bathed by helper: Buttocks, Back, Left lower leg, Right lower leg Assist Level: Touching or steadying assistance(Pt > 75%)  Function- Upper Body Dressing/Undressing What is the patient wearing?: Pull over shirt/dress, Button up shirt Pull over shirt/dress - Perfomed by patient:  Thread/unthread right sleeve, Thread/unthread left sleeve, Put head through opening, Pull shirt over trunk Button up shirt - Perfomed by patient: Thread/unthread right sleeve, Thread/unthread left sleeve, Pull shirt around back, Button/unbutton shirt Assist Level: Touching or steadying assistance(Pt > 75%)(standing for button up shirt) Function -  Lower Body Dressing/Undressing What is the patient wearing?: Non-skid slipper socks, Pants, Underwear Underwear - Performed by helper: Thread/unthread right underwear leg, Thread/unthread left underwear leg, Pull underwear up/down Pants- Performed by helper: Thread/unthread right pants leg, Thread/unthread left pants leg, Pull pants up/down Non-skid slipper socks- Performed by helper: Don/doff right sock, Don/doff left sock Assist for footwear: Dependant Assist for lower body dressing: (total)  Function - Toileting Toileting steps completed by patient: Adjust clothing prior to toileting Toileting steps completed by helper: Performs perineal hygiene, Adjust clothing after toileting Assist level: (MAX A)  Function - Toilet Transfers Toilet transfer assistive device: Elevated toilet seat/BSC over toilet Assist level to toilet: Moderate assist (Pt 50 - 74%/lift or lower) Assist level from toilet: Touching or steadying assistance (Pt > 75%)  Function - Chair/bed transfer Chair/bed transfer method: Stand pivot Chair/bed transfer assist level: Touching or steadying assistance (Pt > 75%) Chair/bed transfer assistive device: Armrests, Bedrails Chair/bed transfer details: Verbal cues for technique, Verbal cues for precautions/safety, Verbal cues for sequencing  Function - Locomotion: Wheelchair Will patient use wheelchair at discharge?: (TBD) Type: Manual Max wheelchair distance: 58' Assist Level: Supervision or verbal cues Assist Level: Supervision or verbal cues Wheel 150 feet activity did not occur: Safety/medical concerns Turns around,maneuvers  to table,bed, and toilet,negotiates 3% grade,maneuvers on rugs and over doorsills: No Function - Locomotion: Ambulation Assistive device: Walker-rolling Max distance: 73' Assist level: Touching or steadying assistance (Pt > 75%) Assist level: Touching or steadying assistance (Pt > 75%) Assist level: Touching or steadying assistance (Pt > 75%) Walk 150 feet activity did not occur: Safety/medical concerns Walk 10 feet on uneven surfaces activity did not occur: Safety/medical concerns  Function - Comprehension Comprehension: Auditory Comprehension assist level: Follows basic conversation/direction with extra time/assistive device  Function - Expression Expression: Verbal Expression assist level: Expresses basic needs/ideas: With extra time/assistive device  Function - Social Interaction Social Interaction assist level: Interacts appropriately 50 - 74% of the time - May be physically or verbally inappropriate.  Function - Problem Solving Problem solving assist level: Solves basic 50 - 74% of the time/requires cueing 25 - 49% of the time  Function - Memory Memory assist level: Recognizes or recalls 25 - 49% of the time/requires cueing 50 - 75% of the time Patient normally able to recall (first 3 days only): That he or she is in a hospital, Staff names and faces  Medical Problem List and Plan:  1. 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  By the chart, has had some fluctuation in his mental status.  We will continue to observe. 2. DVT Prophylaxis/Anticoagulation: Pharmaceutical: Other (comment) Eliquis no evidence of bleeding 3. Pain Management: pt with hx of RA and lumbar spondylosis/ radiculopathy, OA knees  -tylenol prn  -voltaren gel prn  -kpad for back  4. Mood: LCSW to follow for evaluation and support.  5. Neuropsych: This patient is not fully capable of making decisions on his own behalf.   6. Skin/Wound Care: routine pressure relief measures.  7. Fluids/Electrolytes/Nutrition: Continue to encourage p.o. intake.  Today's labs are pending.  Labs from March 30 were nearly within normal range 8. Aspiration PNA: Patient remains on Zosyn.  Has been on since March 25.  Pending labs today will discontinue as this is day 8 9. Hyponatremia: Felt to be due to PNA--resolving.  Today's labs pending 10 PAF: On Eliquis, metoprolol and tambocor (lower dose per cards). Monitor HR bid.  Heart  rate in the 60s today 11. BPH: off Flomax due to orthostasis? Monitor for voiding for now.  12.  Transaminasemia mild elevation question EtOH related 13.  Hypoalbuminemia-being supplemented   LOS (Days) 3 A FACE TO FACE EVALUATION WAS PERFORMED  Meredith Staggers 08/21/2017, 8:32 AM

## 2017-08-21 NOTE — Progress Notes (Signed)
Patient information reviewed and entered into eRehab system by Blue Winther, RN, CRRN, PPS Coordinator.  Information including medical coding and functional independence measure will be reviewed and updated through discharge.     Per nursing patient was given "Data Collection Information Summary for Patients in Inpatient Rehabilitation Facilities with attached "Privacy Act Statement-Health Care Records" upon admission.  

## 2017-08-21 NOTE — Plan of Care (Signed)
  Problem: Consults Goal: RH BRAIN INJURY PATIENT EDUCATION Description Description: See Patient Education module for eduction specifics Outcome: Progressing   Problem: RH SKIN INTEGRITY Goal: RH STG SKIN FREE OF INFECTION/BREAKDOWN Description No new breakdown with supervision assist   Outcome: Progressing   Problem: RH SAFETY Goal: RH STG ADHERE TO SAFETY PRECAUTIONS W/ASSISTANCE/DEVICE Description STG Adhere to Safety Precautions With Supervision Assistance/Device.  Outcome: Progressing   Problem: RH COGNITION-NURSING Goal: RH STG ANTICIPATES NEEDS/CALLS FOR ASSIST W/ASSIST/CUES Description STG Anticipates Needs/Calls for Assist With Mod I Assistance/Cues.  Outcome: Progressing

## 2017-08-22 ENCOUNTER — Inpatient Hospital Stay (HOSPITAL_COMMUNITY): Payer: Medicare Other | Admitting: Speech Pathology

## 2017-08-22 ENCOUNTER — Inpatient Hospital Stay (HOSPITAL_COMMUNITY): Payer: Medicare Other | Admitting: Occupational Therapy

## 2017-08-22 ENCOUNTER — Inpatient Hospital Stay (HOSPITAL_COMMUNITY): Payer: Medicare Other | Admitting: Physical Therapy

## 2017-08-22 LAB — GLUCOSE, CAPILLARY
Glucose-Capillary: 90 mg/dL (ref 65–99)
Glucose-Capillary: 108 mg/dL — ABNORMAL HIGH (ref 65–99)
Glucose-Capillary: 157 mg/dL — ABNORMAL HIGH (ref 65–99)
Glucose-Capillary: 206 mg/dL — ABNORMAL HIGH (ref 65–99)

## 2017-08-22 NOTE — Progress Notes (Signed)
Subjective/Complaints: No major issues overnight.  Nurse did report episode of bowel incontinence.  Patient in good spirits this morning  ROS: Patient denies fever, rash, sore throat, blurred vision, nausea, vomiting, diarrhea, cough, shortness of breath or chest pain, joint or back pain, headache, or mood change.   Objective: Vital Signs: Blood pressure 105/70, pulse 70, temperature 98.1 F (36.7 C), temperature source Oral, resp. rate 18, height _0  (1.702 m), weight 70.7 kg (155 lb 14.4 oz), SpO2 100 %. Dg Chest 2 View  Result Date: 08/21/2017 CLINICAL DATA:  History of left lower lobe pneumonia.  Follow-up. EXAM: CHEST - 2 VIEW COMPARISON:  08/14/2017 FINDINGS: Cardiomediastinal silhouette is normal. Mediastinal contours appear intact. There is no evidence of pneumothorax. Mild peribronchial airspace consolidation the left lower lobe. Probable small left pleural effusion. Osseous structures are without acute abnormality. Soft tissues are grossly normal. IMPRESSION: Persistent peribronchial airspace opacities in the left lower lobe, and small left pleural effusion. Evaluation with CT of the chest without contrast may be considered, if further imaging evaluation is desired. Electronically Signed   By: Fidela Salisbury M.D.   On: 08/21/2017 13:42   Results for orders placed or performed during the hospital encounter of 08/18/17 (from the past 72 hour(s))  Glucose, capillary     Status: Abnormal   Collection Time: 08/19/17 11:38 AM  Result Value Ref Range   Glucose-Capillary 214 (H) 65 - 99 mg/dL  Glucose, capillary     Status: None   Collection Time: 08/19/17  4:43 PM  Result Value Ref Range   Glucose-Capillary 97 65 - 99 mg/dL  Glucose, capillary     Status: Abnormal   Collection Time: 08/19/17  9:40 PM  Result Value Ref Range   Glucose-Capillary 116 (H) 65 - 99 mg/dL  Glucose, capillary     Status: None   Collection Time: 08/20/17  7:13 AM  Result Value Ref Range   Glucose-Capillary 96 65 - 99 mg/dL  Glucose, capillary     Status: Abnormal   Collection Time: 08/20/17 12:04 PM  Result Value Ref Range   Glucose-Capillary 211 (H) 65 - 99 mg/dL  Glucose, capillary     Status: None   Collection Time: 08/20/17  4:37 PM  Result Value Ref Range   Glucose-Capillary 80 65 - 99 mg/dL  Glucose, capillary     Status: Abnormal   Collection Time: 08/20/17  9:23 PM  Result Value Ref Range   Glucose-Capillary 130 (H) 65 - 99 mg/dL   Comment 1 Notify RN   Basic metabolic panel     Status: Abnormal   Collection Time: 08/21/17  6:28 AM  Result Value Ref Range   Sodium 135 135 - 145 mmol/L   Potassium 3.4 (L) 3.5 - 5.1 mmol/L    Comment: SLIGHT HEMOLYSIS   Chloride 106 101 - 111 mmol/L   CO2 20 (L) 22 - 32 mmol/L   Glucose, Bld 91 65 - 99 mg/dL   BUN 5 (L) 6 - 20 mg/dL   Creatinine, Ser 0.82 0.61 - 1.24 mg/dL   Calcium 8.5 (L) 8.9 - 10.3 mg/dL   GFR calc non Af Amer >60 >60 mL/min   GFR calc Af Amer >60 >60 mL/min    Comment: (NOTE) The eGFR has been calculated using the CKD EPI equation. This calculation has not been validated in all clinical situations. eGFR's persistently <60 mL/min signify possible Chronic Kidney Disease.    Anion gap 9 5 - 15    Comment: Performed  at [facility], [place] [address] [place], [place] [address]  Glucose, capillary     Status: None   Collection Time: 08/21/17  6:53 AM  Result Value Ref Range   Glucose-Capillary 90 65 - 99 mg/dL   Comment 1 Notify RN   Glucose, capillary     Status: Abnormal   Collection Time: 08/21/17 12:49 PM  Result Value Ref Range   Glucose-Capillary 157 (H) 65 - 99 mg/dL  Glucose, capillary     Status: Abnormal   Collection Time: 08/21/17  4:59 PM  Result Value Ref Range   Glucose-Capillary 129 (H) 65 - 99 mg/dL  Glucose, capillary     Status: Abnormal   Collection Time: 08/21/17  8:55 PM  Result Value Ref Range   Glucose-Capillary 163 (H) 65 - 99 mg/dL  Glucose, capillary     Status:  None   Collection Time: 08/22/17  6:43 AM  Result Value Ref Range   Glucose-Capillary 90 65 - 99 mg/dL     Constitutional: No distress . Vital signs reviewed. HEENT: EOMI, oral membranes moist Cardiovascular: RRR without murmur. No JVD    Respiratory: CTA Bilaterally without wheezes or rales. Normal effort    GI: BS +, non-tender, non-distended  Extremity:  Pulses positive and No Edema Skin:   Intact Neuro: Oriented to person and place.  Knows month and year as well.  Follows all basic commands..  Abnormal Motor 4/5 bilateral deltoid bicep tricep grip hip flexor knee extensor ankle dorsiflexor.  Inconsistent sensory exam  Musc/Skel: Mild low back pain Psych: Pleasant, non-agitated   Assessment/Plan: 1. Functional deficits secondary to metabolic encephalopathy and deconditioning which require 3+ hours per day of interdisciplinary therapy in a comprehensive inpatient rehab setting. Physiatrist is providing close team supervision and 24 hour management of active medical problems listed below. Physiatrist and rehab team continue to assess barriers to discharge/monitor patient progress toward functional and medical goals. FIM: Function - Bathing Body parts bathed by patient: Right arm, Left arm, Chest, Abdomen, Front perineal area, Right upper leg, Left upper leg Body parts bathed by helper: Buttocks, Back, Left lower leg, Right lower leg Assist Level: Touching or steadying assistance(Pt > 75%)  Function- Upper Body Dressing/Undressing What is the patient wearing?: Pull over shirt/dress, Button up shirt Pull over shirt/dress - Perfomed by patient: Thread/unthread right sleeve, Thread/unthread left sleeve, Put head through opening, Pull shirt over trunk Button up shirt - Perfomed by patient: Thread/unthread right sleeve, Thread/unthread left sleeve, Pull shirt around back, Button/unbutton shirt Assist Level: Touching or steadying assistance(Pt > 75%)(standing for button up shirt) Function  - Lower Body Dressing/Undressing What is the patient wearing?: Non-skid slipper socks, Pants, Underwear Underwear - Performed by helper: Thread/unthread right underwear leg, Thread/unthread left underwear leg, Pull underwear up/down Pants- Performed by helper: Thread/unthread right pants leg, Thread/unthread left pants leg, Pull pants up/down Non-skid slipper socks- Performed by helper: Don/doff right sock, Don/doff left sock Assist for footwear: Dependant Assist for lower body dressing: (total)  Function - Toileting Toileting steps completed by patient: Adjust clothing prior to toileting, Performs perineal hygiene, Adjust clothing after toileting(Patient wiped himself inadequatly but would not allow assistance.) Toileting steps completed by helper: Adjust clothing after toileting Toileting Assistive Devices: Grab bar or rail Assist level: Touching or steadying assistance (Pt.75%)  Function - Air cabin crew transfer assistive device: Elevated toilet seat/BSC over toilet Assist level to toilet: Supervision or verbal cues Assist level from toilet: Supervision or verbal cues  Function - Chair/bed  transfer Chair/bed transfer method: Ambulatory Chair/bed transfer assist level: Supervision or verbal cues Chair/bed transfer assistive device: Bedrails, Armrests Chair/bed transfer details: Verbal cues for technique, Verbal cues for precautions/safety, Verbal cues for sequencing  Function - Locomotion: Wheelchair Will patient use wheelchair at discharge?: (TBD) Type: Manual Max wheelchair distance: 44' Assist Level: Supervision or verbal cues Assist Level: Supervision or verbal cues Wheel 150 feet activity did not occur: Safety/medical concerns Turns around,maneuvers to table,bed, and toilet,negotiates 3% grade,maneuvers on rugs and over doorsills: No Function - Locomotion: Ambulation Assistive device: Walker-rolling Max distance: 59' Assist level: Touching or steadying assistance  (Pt > 75%) Assist level: Touching or steadying assistance (Pt > 75%) Assist level: Touching or steadying assistance (Pt > 75%) Walk 150 feet activity did not occur: Safety/medical concerns Walk 10 feet on uneven surfaces activity did not occur: Safety/medical concerns  Function - Comprehension Comprehension: Auditory Comprehension assist level: Follows basic conversation/direction with no assist  Function - Expression Expression: Verbal Expression assist level: Expresses basic needs/ideas: With extra time/assistive device  Function - Social Interaction Social Interaction assist level: Interacts appropriately 75 - 89% of the time - Needs redirection for appropriate language or to initiate interaction.  Function - Problem Solving Problem solving assist level: Solves basic 75 - 89% of the time/requires cueing 10 - 24% of the time  Function - Memory Memory assist level: Recognizes or recalls 25 - 49% of the time/requires cueing 50 - 75% of the time, Recognizes or recalls 50 - 74% of the time/requires cueing 25 - 49% of the time Patient normally able to recall (first 3 days only): Staff names and faces, That he or she is in a hospital  Medical Problem List and Plan:  1. 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   -Team conference today 2. DVT Prophylaxis/Anticoagulation: Pharmaceutical: Other (comment) Eliquis no evidence of bleeding 3. Pain Management: pt with hx of RA and lumbar spondylosis/ radiculopathy, OA knees  -tylenol prn  -voltaren gel prn  -kpad for back  4. Mood: LCSW to follow for evaluation and support.  5. Neuropsych: This patient is not fully capable of making decisions on his own behalf.  6. Skin/Wound Care: routine pressure relief measures.  7. Fluids/Electrolytes/Nutrition: Continue to encourage p.o. intake.  Today's labs are pending.  Labs from March 30 were nearly within normal  range 8. Aspiration PNA: -Chest x-ray reviewed: Left lower lobe ?opacities but generally unimpressive.    -Zosyn stopped yesterday   -Continue to monitor clinically.   -Follow-up lab work tomorrow 9. Hyponatremia: Sodium up to 135 on 08/21/2017 10 PAF: On Eliquis, metoprolol and tambocor (lower dose per cards). Monitor HR bid.  Heart rate in the 60s +  11. BPH: off Flomax due to orthostasis? Monitor for voiding for now.  12.  Transaminasemia mild elevation question EtOH related 13.  Hypoalbuminemia-being supplemented   LOS (Days) 4 A FACE TO FACE EVALUATION WAS PERFORMED  Meredith Staggers 08/22/2017, 8:20 AM

## 2017-08-22 NOTE — Progress Notes (Signed)
Physical Therapy Note  Patient Details  Name: [name] MRN: [id number] Date of Birth: 09-03-1940 Today's Date: 08/22/2017    Time: 1030-1125 55 minutes  1:1 No c/o pain.  Pt requires supervision for bed mobility, cues for safety and positioning.  Gait throughout unit with RW with supervision up to 200'.  Gait in home environment and bathroom with supervision, cues for safety and use of RW in tight spaces.  Pt able to stand to urinate with 1 UE support and supervision.  Gait without AD as pt tends to forget his RW, pt requires min guard/min A for gait without AD.  Obstacle negotiation with RW with supervision, cues for technique.  Stair negotiation x 8 stairs with bilat handrails with supervision.  Standing and seated therex with 1# wt bilat LEs for hip and knee strengthening.  nustep for continued LE strength and endurance x 10 minutes level 5 with UEs and LEs.  Pt left in room with needs at hand, alarm set.   Simya Tercero 08/22/2017, 11:25 AM

## 2017-08-22 NOTE — Progress Notes (Signed)
PMR Admission Coordinator Pre-Admission Assessment  Patient: [name] is an 77 y.o., male MRN: [id number] DOB: Mar 28, 1941 Height: 5' 7"  (170.2 cm) Weight: 69.4 kg (153 lb)                                                                                                                                                  Insurance Information HMO:     PPO:      PCP:      IPA:      80/20:      OTHER:  PRIMARY: UHC Medicare       Policy#: [id number]      Subscriber: Self CM Name: Vevelyn Royals       Phone#: [phone]     Fax#: [phone] Pre-Cert#: [id number] for initial 7 days 08/18/17- 08/24/17 and follow up updates will be requested via fax as needed    Employer:  Benefits:  Phone #: [phone]     Name: Verified online at [email or web address] Eff. Date: 05/23/17     Deduct: $0      Out of Pocket Max: $4000      Life Max: N/A CIR: $160 a day, days 1-10; $0 a day, days 11+      SNF: $0 a day, days 1-20; $50 a day, days 21-100 Outpatient: Necessity      Co-Pay: $20 per visit  Home Health: Necessity, 100%      Co-Pay: $0 DME: 80%     Co-Pay: 20% Providers: In-network   SECONDARY: None       Medicaid Application Date:       Case Manager:  Disability Application Date:       Case Worker:   Emergency Publishing copy Information    Name Relation Home Work Mobile   [place] Spouse [phone]  [phone]   Sharee [place] Son   [phone]     Current Medical History  Patient Admitting Diagnosis: Metabolic encephalopathy   History of Present Illness: [name] a 76 y.o.malewith history of PAF, T2DM with neuropathy, RA, lumbar spondylosiswith radiculopathys/p ESI 3/4 and OA B-knees, falls with BLE weakness, difficulty walking and had negative MRI brain/spine on ED visit 3/17. He was discharged to home but continued to worsen with onset of sundowning and was admitted 08/11/17 for work up. Family reported gradual memory decline which has  been worse in the past few weeks to months. CT head reviewed, unremarkable for acute intracranial process. Per report,chronic microangiopathic ischemic changes of periventricular and subcortical white matter with cerebral atrophy. He was noted to be hyponatremic with Na-126. CXR negative. UDS negative. Respiratory panel negative. Patient noted to be somnolent with significant cognitive deficits.   Neurology consulted and felt that patient with metabolic encephalopathy due to acute hyponatremia,possible ETOH withdrawal v/s Wernicke's encephalopathy v/s underlying dementia  or sensory ataxia. He has history of daily alcohol use and was started on IV thiamine. He developed fever of 102 on 3/25, was pan cultured and was started on IV Zosyn for likely aspiration LLL PNA. Foley placed for urinary retention. MRI thoracic spine showed mild degenerative spondylosis but no acute abnormality.Therapy ongoing and CIR recommended due to functional decline and patient admitted 08/18/17.    Past Medical History      Past Medical History:  Diagnosis Date  . DM (diabetes mellitus) ([place])   . GERD (gastroesophageal reflux disease)   . Hyperlipidemia   . Hypertension   . Legionella pneumonia ([place])   . Osteoarthritis   . Osteopenia   . Rheumatoid arthritis ([place])   . Thyroid disease     Family History  family history includes CAD in his brother; Diabetes Mellitus I in his father and mother.  Prior Rehab/Hospitalizations:  Has the patient had major surgery during 100 days prior to admission? Yes  Current Medications   Current Facility-Administered Medications:  .  acetaminophen (TYLENOL) tablet 650 mg, 650 mg, Oral, Q6H PRN, 650 mg at 08/18/17 0910 **OR** acetaminophen (TYLENOL) suppository 650 mg, 650 mg, Rectal, Q6H PRN, Domenic Polite, MD, 650 mg at 08/14/17 1601 .  apixaban (ELIQUIS) tablet 5 mg, 5 mg, Oral, BID, Domenic Polite, MD, 5 mg at 08/18/17 0909 .  diclofenac sodium  (VOLTAREN) 1 % transdermal gel 4 g, 4 g, Topical, QID PRN, Domenic Polite, MD .  flecainide [facility]) tablet 50 mg, 50 mg, Oral, Q12H, Domenic Polite, MD, 50 mg at 08/18/17 0910 .  folic acid (FOLVITE) tablet 1 mg, 1 mg, Oral, Daily, Domenic Polite, MD, 1 mg at 08/18/17 0909 .  ibuprofen (ADVIL,MOTRIN) tablet 200 mg, 200 mg, Oral, Q6H PRN, Cruzita Lederer, Costin M, MD .  insulin aspart (novoLOG) injection 0-9 Units, 0-9 Units, Subcutaneous, TID WC, Domenic Polite, MD, 1 Units at 08/18/17 1325 .  levothyroxine (SYNTHROID, LEVOTHROID) tablet 75 mcg, 75 mcg, Oral, QAC breakfast, Domenic Polite, MD, 75 mcg at 08/18/17 0711 .  metoprolol tartrate (LOPRESSOR) tablet 25 mg, 25 mg, Oral, BID, Domenic Polite, MD, 25 mg at 08/18/17 0910 .  ondansetron (ZOFRAN) tablet 4 mg, 4 mg, Oral, Q6H PRN **OR** ondansetron (ZOFRAN) injection 4 mg, 4 mg, Intravenous, Q6H PRN, Domenic Polite, MD .  pantoprazole (PROTONIX) EC tablet 40 mg, 40 mg, Oral, Daily, Domenic Polite, MD, 40 mg at 08/18/17 0909 .  piperacillin-tazobactam (ZOSYN) IVPB 3.375 g, 3.375 g, Intravenous, Q8H, Domenic Polite, MD, Last Rate: 12.5 mL/hr at 08/18/17 1324, 3.375 g at 08/18/17 1324 .  rosuvastatin (CRESTOR) tablet 10 mg, 10 mg, Oral, Daily, Domenic Polite, MD, 10 mg at 08/18/17 0910 .  saccharomyces boulardii (FLORASTOR) capsule 250 mg, 250 mg, Oral, BID, Domenic Polite, MD, 250 mg at 08/18/17 0909 .  thiamine (VITAMIN B-1) tablet 100 mg, 100 mg, Oral, Daily, Domenic Polite, MD, 100 mg at 08/18/17 0910 .  vitamin B-12 (CYANOCOBALAMIN) tablet 1,000 mcg, 1,000 mcg, Oral, Daily, Costello, Mary A, NP, 1,000 mcg at 08/18/17 0909  Patients Current Diet: Aspiration precautions Diet vegetarian Room service appropriate? Yes; Fluid consistency: Thin  Precautions / Restrictions Precautions Precautions: Fall Precaution Comments: up with assist only Restrictions Weight Bearing Restrictions: No   Has the patient had 2 or more falls or a fall  with injury in the past year?Yes  Prior Activity Level Community (5-7x/wk): Prior to 08/06/17 Dr.K was fully independent and active. He is a retired Network engineer, who taught at [employer], and taught  Theatre stage manager.  He has a history of knee problems in 2016 and would occasionally use a cane, but this did not impede him prior to admission.   Home Assistive Devices / Equipment Home Assistive Devices/Equipment: Eyeglasses Home Equipment: Walker - 2 wheels, cane   Prior Device Use: Indicate devices/aids used by the patient prior to current illness, exacerbation or injury? Intermittent use of single point cane   Prior Functional Level Prior Function Level of Independence: Independent Comments: Pt was fully independent including community activities, and driving up until ~7/68 at which time he started to have falls and confusion.  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 Independent  Indoor Mobility: Did the patient need assistance with walking from room to room (with or without device)? Independent  Stairs: Did the patient need assistance with internal or external stairs (with or without device)? Independent  Functional Cognition: Did the patient need help planning regular tasks such as shopping or remembering to take medications? Independent  Current Functional Level Cognition  Overall Cognitive Status: Impaired/Different from baseline Current Attention Level: Sustained Orientation Level: Oriented X4 Following Commands: Follows one step commands inconsistently, Follows one step commands with increased time Safety/Judgement: Decreased awareness of deficits, Decreased awareness of safety General Comments: increased time to respond to cues, fearful of falling and unsafe despite cues for safety    Extremity Assessment (includes Sensation/Coordination)  Upper  Extremity Assessment: Generalized weakness  Lower Extremity Assessment: Defer to PT evaluation    ADLs  Overall ADL's : Needs assistance/impaired Eating/Feeding: Minimal assistance, Bed level Grooming: Wash/dry hands, Wash/dry face, Minimal assistance, Sitting Grooming Details (indicate cue type and reason): assist for thoroughness  Upper Body Bathing: Maximal assistance, Sitting Lower Body Bathing: Maximal assistance, Sit to/from stand Lower Body Bathing Details (indicate cue type and reason): assist for thoroughness and sequencing  Upper Body Dressing : Maximal assistance, Sitting Lower Body Dressing: Total assistance, Sit to/from stand Lower Body Dressing Details (indicate cue type and reason): Pt unable to access feet to don socks.  Unable to problem solve through possible solutions or to conclude that he was unable to perform task.  He just sat wiggling his feet while holding his socks.   Toilet Transfer: Moderate assistance, +2 for safety/equipment, Stand-pivot, BSC Toilet Transfer Details (indicate cue type and reason): max verbal cues for sequencing and mod A for balance and to assist with weight shift  Toileting- Clothing Manipulation and Hygiene: Maximal assistance, Sit to/from stand Functional mobility during ADLs: Moderate assistance, +2 for safety/equipment General ADL Comments: Pt requires assist due to impaired cognition and balance.  He demonstrates significant motor planning deficits     Mobility  Overal bed mobility: Needs Assistance Bed Mobility: Supine to Sit, Sit to Supine Supine to sit: Max assist, HOB elevated Sit to supine: Mod assist General bed mobility comments: up in chair    Transfers  Overall transfer level: Needs assistance Equipment used: Rolling walker (2 wheeled) Transfers: Sit to/from Stand Sit to Stand: Mod assist, +2 safety/equipment Stand pivot transfers: Mod assist, +2 safety/equipment General transfer comment: initially too posterior to  stsand, pt cued to reach for feet several times to improve anterior weight shift and improved safety/less assist with sit to stand, needs lifting and lowering help    Ambulation / Gait / Stairs / Wheelchair Mobility  Ambulation/Gait Ambulation/Gait assistance: Mod assist, +2 safety/equipment Ambulation Distance (Feet): 50  Feet(& 2 x 8' in room) Assistive device: Rolling walker (2 wheeled) Gait Pattern/deviations: Step-to pattern, Decreased stride length, Drifts right/left, Shuffle, Staggering left General Gait Details: increased forward and L lateral flexion with ambulation in hallway, able to assist to improve upright if righting head, but reutrns to leaning L due to fear of falling and reaching for wall rail in hallway despite cues to keep hands on walker.  Needs assist to push walker to keep moving forward due to relutant to walk Gait velocity: slow Gait velocity interpretation: Below normal speed for age/gender    Posture / Balance Dynamic Sitting Balance Sitting balance - Comments: leans back, can sit with S if hands on chair Balance Overall balance assessment: Needs assistance Sitting-balance support: Feet supported Sitting balance-Leahy Scale: Poor Sitting balance - Comments: leans back, can sit with S if hands on chair Postural control: Posterior lean Standing balance support: Bilateral upper extremity supported Standing balance-Leahy Scale: Poor Standing balance comment: min to mod A in standing with UE support, stood to read white board with continued posterior lean and L lateral neck flexion    Special needs/care consideration BiPAP/CPAP: No CPM: No Continuous Drip IV: No Dialysis: No         Life Vest: No Oxygen: No Special Bed: No Trach Size: No Wound Vac (area): No       Skin: Dry, Abrasions to bilateral knees, Bruising to arms, chest, knees, and legs                         Bowel mgmt: Continent, last BM 08/18/17 Bladder mgmt: Continent, history of urinary  retention  Diabetic mgmt: Yes, and patient maintained with daily CBG checks and oral medication     Previous Home Environment Living Arrangements: Spouse/significant other Available Help at Discharge: Family, Friend(s), Available PRN/intermittently, Available 24 hours/day Type of Home: House Home Layout: Two level, Able to live on main level with bedroom/bathroom Alternate Level Stairs-Number of Steps: full flight  Home Access: Stairs to enter Technical brewer of Steps: 2-3 Bathroom Shower/Tub: Multimedia programmer: Handicapped height Home Care Services: No Additional Comments: Pt lives with wife.  Sons are able to assist some.  They have moved bed from upstairs bedroom onto the main level   Discharge Living Setting Plans for Discharge Living Setting: Patient's home, Lives with (comment)(Spouse ) Type of Home at Discharge: House Discharge Home Layout: Two level, Able to live on main level with bedroom/bathroom(1/2 bathroom and a shower, seperate ) Alternate Level Stairs-Rails: Left Alternate Level Stairs-Number of Steps: 1 flight, 13-14 steps Discharge Home Access: Stairs to enter Entrance Stairs-Rails: None Entrance Stairs-Number of Steps: 2-3 Discharge Bathroom Shower/Tub: Walk-in shower Discharge Bathroom Toilet: Handicapped height Discharge Bathroom Accessibility: No Does the patient have any problems obtaining your medications?: No  Social/Family/Support Systems Patient Roles: Spouse, Parent Contact Information: Spouse: Land Anticipated Caregiver: Spouse and hired assist as needed  Equities trader Information: see above  Ability/Limitations of Caregiver: Spouse likely able to do Min A, if limited in amount of physical assist she will look into hired assist  Caregiver Availability: 24/7 Discharge Plan Discussed with Primary Caregiver: Yes Is Caregiver In Agreement with Plan?: Yes Does Caregiver/Family have Issues with  Lodging/Transportation while Pt is in Rehab?: No  Goals/Additional Needs Patient/Family Goal for Rehab: PT/OT: Min A; SLP: Min-Mod A Expected length of stay: 19-24 days  Cultural Considerations: Hindu, spiritual, vegeterian with no eggs but dairy is ok Dietary Needs: Vegetarian  diet  Equipment Needs: TBD Pt/Family Agrees to Admission and willing to participate: Yes Program Orientation Provided & Reviewed with Pt/Caregiver Including Roles  & Responsibilities: Yes  Barriers to Discharge: Medical stability, Home environment access/layout   Decrease burden of Care through IP rehab admission: No  Possible need for SNF placement upon discharge: No  Patient Condition: This patient's medical and functional status has changed since the consult dated: 08/16/17 at 1352 in which the Rehabilitation Physician determined and documented that the patient's condition is appropriate for intensive rehabilitative care in an inpatient rehabilitation facility. See "History of Present Illness" (above) for medical update. Functional changes are: Mod A +2 for 50 feet. Patient's medical and functional status update has been discussed with the Rehabilitation physician and patient remains appropriate for inpatient rehabilitation. Will admit to inpatient rehab today.  Preadmission Screen Completed By:  Gunnar Fusi, 08/18/2017 4:13 PM ______________________________________________________________________   Discussed status with Dr. Naaman Plummer on 08/18/17 at 1620 and received telephone approval for admission today.  Admission Coordinator:  Gunnar Fusi, time 1620/Date 08/18/17             Cosigned by: Meredith Staggers, MD at 08/18/2017 4:53 PM  Revision History

## 2017-08-22 NOTE — Progress Notes (Signed)
Occupational Therapy Session Note  Patient Details  Name: [name] MRN: [id number] Date of Birth: 02/23/1941  Today's Date: 08/22/2017 OT Individual Time: 9476-5465 OT Individual Time Calculation (min): 75 min    Short Term Goals: Week 1:  OT Short Term Goal 1 (Week 1): Pt will stand at sink for 2 grooming items without seated rest break and CGA OT Short Term Goal 2 (Week 1): Pt will complete toilet transfer wiht LRAD and CGA OT Short Term Goal 3 (Week 1): Pt will thread BLE into pants wiht AE PRN and supervision OT Short Term Goal 4 (Week 1): Pt will complete clothing managment wiht touching A  Skilled Therapeutic Interventions/Progress Updates:    Upon entering the room, pt seated on EOB awaiting therapist and requesting to shower. OT covered IV and pt ambulating to bathroom with steady assistance and use of RW. Pt bathing from TTB with min A for balance when washing buttocks and peri area. Pt returning to sit on EOB to don clothing items with increased time and steady assistance. Pt impulsive with movements and needing mod multimodal cues for safety awareness throughout session. Pt remained seated in wheelchair with quick release belt and chair alarm activated. Call bell and all needs within reach upon exiting the room.   Therapy Documentation Precautions:  Precautions Precautions: Fall Precaution Comments: up with assist only Restrictions Weight Bearing Restrictions: No  See Function Navigator for Current Functional Status.   Therapy/Group: Individual Therapy  Gypsy Decant 08/22/2017, 1:12 PM

## 2017-08-22 NOTE — Progress Notes (Signed)
Patient episode of bowel incontinence earlier in shift. Patient cleaned self with toilet paper but would not allow anyone to help him.

## 2017-08-22 NOTE — Progress Notes (Signed)
Speech Language Pathology Daily Session Note  Patient Details  Name: [name] MRN: [id number] Date of Birth: 12-10-1940  Today's Date: 08/22/2017 SLP Individual Time: 1643-5391 SLP Individual Time Calculation (min): 55 min  Short Term Goals: Week 1: SLP Short Term Goal 1 (Week 1): Pt will utilize external memory aids to facilitate recall of daily information with mod assist verbal cues.   SLP Short Term Goal 2 (Week 1): Pt will recognize and correct errors in the moment during basic, famiiliar tasks with mod assist verbal cues.  SLP Short Term Goal 3 (Week 1): Pt will complete basic, familiar tasks with mod assist verbal cues for functional problem solving.   SLP Short Term Goal 4 (Week 1): Pt will selectively attend to tasks in a mildly distracting environment for 15 minutes with min verbal cues for redirection.    Skilled Therapeutic Interventions:   Pt was seen for skilled ST targeting cognitive goals.  SLP facilitated the session with medication management tasks to address problem solving, selective attention, and recall goals.  Pt completed task with mod-max assist for recall of function of medications when named as well as for task organization when organizing pills into a twice daily pill box.  Pt was returned to room and left in bed with call bell within reach and bed alarm set.  Pt requested to call his wife and needed mod-max assist verbal cues to recall her telephone number as he often gave therapist the wrong sequence of numbers and then would correct himself.   Continue per current plan of care.     Function:  Eating Eating                 Cognition Comprehension Comprehension assist level: Follows basic conversation/direction with no assist  Expression   Expression assist level: Expresses basic needs/ideas: With extra time/assistive device  Social Interaction Social Interaction assist level: Interacts appropriately 75 - 89% of the time - Needs redirection for  appropriate language or to initiate interaction.  Problem Solving Problem solving assist level: Solves basic 50 - 74% of the time/requires cueing 25 - 49% of the time  Memory Memory assist level: Recognizes or recalls 25 - 49% of the time/requires cueing 50 - 75% of the time    Pain Pain Assessment Pain Scale: 0-10 Pain Score: 0-No pain  Therapy/Group: Individual Therapy  Earlyne Feeser, Selinda Orion 08/22/2017, 4:17 PM
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
## 2017-08-22 NOTE — Progress Notes (Signed)
Social Work  Social Work Assessment and Plan  Patient Details  Name: [name] MRN: [id number] Date of Birth: [birth year]  Today's Date: 08/21/2017  Problem List:  Patient Active Problem List   Diagnosis Date Noted  . Encephalopathy 08/18/2017  . Fever   . SIADH (syndrome of inappropriate ADH production) ([place])   . PAF (paroxysmal atrial fibrillation) ([place])   . Type 2 diabetes mellitus with peripheral neuropathy (HCC)   . Hypokalemia   . Urinary retention   . Acute blood loss anemia   . Hypothyroidism   . Spinal stenosis 08/12/2017  . 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 Type 2 diabetes mellitus with other specified complication ([place]) 11/91/4782  . Hyponatremia 08/12/2017  . Atrial fibrillation ([place]) 01/16/2016  . Rheumatoid arthritis ([place]) 01/16/2016  . History of artificial joint 12/19/2014  . H/O knee surgery 12/19/2014  . Primary osteoarthritis of both knees 10/27/2014  . Osteopenia 03/31/2014  . HEARTBURN 09/04/2009   Past Medical History:  Past Medical History:  Diagnosis Date  . DM (diabetes mellitus) ([place])   . GERD (gastroesophageal reflux disease)   . Hyperlipidemia   . Hypertension   . Legionella pneumonia ([place])   . Osteoarthritis   . Osteopenia   . Rheumatoid arthritis ([place])   . Thyroid disease    Past Surgical History:  Past Surgical History:  Procedure Laterality Date  . COLONOSCOPY     Social History:  reports that he has quit smoking. His smoking use included cigarettes. He has never used smokeless tobacco. He reports that he drinks alcohol. He reports that he does not use drugs.  Family / Support Systems Marital Status: Married Patient Roles: Spouse, Parent Spouse/Significant Other: wife, [name] @ (H) [phone] or (C) [phone] Children: son, [name]) @ (C) [phone] Anticipated Caregiver: Spouse and hired assist as needed  Ability/Limitations of Caregiver: Spouse likely able to do Min A, if limited in amount of  physical assist she will look into hired assist  Caregiver Availability: 24/7 Family Dynamics: Wife very supportive and able to provide any needed support.  Social History Preferred language: English Religion: Hindu Cultural Background: Pt originally from [place] Education: PhD Read: Yes Write: Yes Employment Status: Retired Freight forwarder Issues: None Guardian/Conservator: None - per MD, patient not fully capable of making decisions on his own behalf-defer to spouse.   Abuse/Neglect Abuse/Neglect Assessment Can Be Completed: Yes Physical Abuse: Denies Verbal Abuse: Denies Sexual Abuse: Denies Exploitation of patient/patient's resources: Denies Self-Neglect: Denies  Emotional Status Pt's affect, behavior adn adjustment status: Patient sitting up in wheelchair and very pleasant, completing assessment interview without much difficulty.  Answers confirmed correct with patient's spouse.  Patient denies any significant emotional distress, however, will monitor and refer for neuropsychology if indicated. Recent Psychosocial Issues: None Pyschiatric History: None Substance Abuse History: Patient and wife confirmed patient drinks alcohol daily but they do not consider it to be excessive.  Patient / Family Perceptions, Expectations & Goals Pt/Family understanding of illness & functional limitations: Patient states that he admitted to the hospital "because my balance was getting bad."  Both patient and wife with limited understanding about underlying medical reasons for his functional decline. Premorbid pt/family roles/activities: Patient was independent PTA Anticipated changes in roles/activities/participation: Anticipate patient will need closer supervision at home for fall prevention. Pt/family expectations/goals: "I hope I am home soon."  [employer]: None Premorbid Home Care/DME Agencies: None Transportation available at discharge: Yes Resource  referrals recommended: Neuropsychology  Discharge Planning Living Arrangements:  Spouse/significant other Support Systems: Spouse/significant other, Children Type of Residence: Private residence Insurance Resources: Medicare Financial Screen Referred: No Living Expenses: Own Money Management: Spouse Does the patient have any problems obtaining your medications?: No Home Management: Mostly spouse Patient/Family Preliminary Plans: Patient to discharge home with wife as primary caregiver.  She is able to provide 24/7 supervision. Expected length of stay: 12-16 days  Clinical Impression Elderly gentleman here on CIR to metabolic encephalopathy likely related to comorbidities and alcohol use.  Cognition and physical deficits are improving with short length of stay anticipated.  Patient denies any significant emotional distress.  Wife is able to provide 24/7 supervision to light physical assistance.  Social work to follow for discharge planning and support needs.  [name] 08/21/2017, 12:28 PM

## 2017-08-22 NOTE — Progress Notes (Signed)
Physical Medicine and Rehabilitation Consult   Reason for Consult: Encephalopathy Referring Physician: Dr. Tana Coast    HPI: [name] is a 77 y.o. 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 He was discharged to home but continued to worsen with onset of sundowning and was admitted 08/11/17 for work up.  Family reported gradual memory decline which has been worse in the past few weeks to months. CT head reviewed, unremarkable for acute intracranial process. Per report, chronic microangiopathic ischemic changes of periventricular and subcortical white matter with cerebral atrophy.  He was noted to be hyponatremic with Na-126. CXR negative.  UDS negative. Respiratory panel negative. Patient noted to be somnolent with significant cognitive deficits.   Neurology consulted and felt that patient with metabolic encephalopathy due to acute hyponatremia, possible ETOH withdrawal v/s Wernicke's encephalopathy v/s underlying dementia or sensory ataxia. He has history of daily alcohol use and was started on IV thiamine. He developed fever of 102 on 3/25, was pan cultured and was started on IV Zosyn for likely aspiration LLL PNA. Foley placed for urinary retention. MRI thoracic spine showed mild degenerative spondylosis but no acute abnormality. Therapy ongoing and CIR recommended due to functional decline. Discussed case with rehab team and Cards.  Will discuss with hospitalist.   Review of Systems  Unable to perform ROS: Mental acuity         Past Medical History:  Diagnosis Date  . DM (diabetes mellitus) ([place])   . GERD (gastroesophageal reflux disease)   . Hyperlipidemia   . Hypertension   . Legionella pneumonia ([place])   . Osteoarthritis   . Osteopenia   . Rheumatoid arthritis ([place])   . Thyroid disease       Past Surgical History:  Procedure Laterality Date  . COLONOSCOPY           Family History  Problem Relation Age of Onset  . Diabetes Mellitus I Father   . Diabetes Mellitus I Mother   . CAD Brother     Social History: Married. Retired Network engineer. Independent till a few weeks PTA?  Per reports that he has quit smoking 2016.  His smoking use included cigarettes. He has never used smokeless tobacco.  Per reports that he drinks alcohol. He reports that he does not use drugs.        Allergies  Allergen Reactions  . Hydrocodone Nausea Only          Medications Prior to Admission  Medication Sig Dispense Refill  . apixaban (ELIQUIS) 5 MG TABS tablet Take 5 mg by mouth 2 (two) times daily.    . diclofenac sodium (VOLTAREN) 1 % GEL Apply topically 4 (four) times daily.    Marland Kitchen esomeprazole (NEXIUM) 40 MG capsule Take 40 mg by mouth daily after supper.   1  . folic acid (FOLVITE) 1 MG tablet Take 1 mg by mouth daily.    . irbesartan (AVAPRO) 150 MG tablet Take 150 mg by mouth daily.     Marland Kitchen levothyroxine (SYNTHROID, LEVOTHROID) 75 MCG tablet Take 75 mcg by mouth daily before breakfast.    . metFORMIN (GLUCOPHAGE-XR) 500 MG 24 hr tablet Take 500 mg by mouth 2 (two) times daily.   0  . methotrexate (50 MG/ML) 1 g injection Inject into the vein once.    . metoprolol tartrate (LOPRESSOR) 25 MG tablet  Take 1 tablet (25 mg total) by mouth 2 (two) times daily. 180 tablet 2  . rosuvastatin (CRESTOR) 10 MG tablet Take 10 mg by mouth daily.  0    Home: Home Living Family/patient expects to be discharged to:: Private residence Living Arrangements: Spouse/significant other Available Help at Discharge: Family, Friend(s), Available PRN/intermittently, Available 24 hours/day Type of Home: House Home Access: Stairs to enter [employer] of Steps: 2-3 Home Layout: Two level, Able to live on main level with bedroom/bathroom Alternate Level Stairs-Number of Steps:  full flight  Bathroom Shower/Tub: Multimedia programmer: Handicapped height Home Equipment: Environmental consultant - 2 wheels Additional Comments: Pt lives with wife.  Sons are able to assist some.  They have moved bed from upstairs bedroom onto the main level   Functional History: Prior Function Level of Independence: Independent Comments: Pt was fully independent including community activities, and driving up until ~4/28 at which time he started to have falls and confusion.  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 minA when pt pushing up from arm rests of chair Ambulation/Gait Ambulation/Gait assistance: Mod assist Ambulation Distance (Feet): 75 Feet(x1, 50'x1) Assistive device: Rolling walker (2 wheeled) Gait Pattern/deviations: Step-to pattern, Decreased stride length, Shuffle General Gait Details: modA to maintain forward momentum with walker due to impaired sequencing and being easily distracted. Pt required seated rest break  pt unable to manage walker without modA Gait velocity: slow Gait velocity interpretation: Below normal speed for age/gender  ADL: ADL Overall ADL's : Needs assistance/impaired Eating/Feeding: Minimal assistance, Bed level Grooming: Wash/dry hands, Wash/dry face, Brushing hair, Moderate assistance, Sitting Upper Body Bathing: Maximal assistance, Sitting Lower Body Bathing:  Maximal assistance, Sit to/from stand Upper Body Dressing : Maximal assistance, Sitting Lower Body Dressing: Total assistance, Sit to/from stand Lower Body Dressing Details (indicate cue type and reason): Pt unable to access feet to don socks.  Unable to problem solve through possible solutions or to conclude that he was unable to perform task.  He just sat wiggling his feet while holding his socks.   Toilet Transfer: Moderate assistance, Stand-pivot, BSC Toilet Transfer Details (indicate cue type and reason): max verbal cues for sequencing and mod A for balance and to assist with weight shift  Toileting- Clothing Manipulation and Hygiene: Maximal assistance, Sit to/from stand Functional mobility during ADLs: Moderate assistance  Cognition: Cognition Overall Cognitive Status: Impaired/Different from baseline Orientation Level: Disoriented to place, Disoriented to time, Oriented to person Cognition Arousal/Alertness: Awake/alert Behavior During Therapy: Flat affect Overall Cognitive Status: Impaired/Different from baseline Area of Impairment: Orientation, Attention, Memory, Following commands, Safety/judgement, Problem solving, Awareness Orientation Level: Disoriented to, Place, Situation, Time Current Attention Level: Sustained Memory: Decreased short-term memory Following Commands: Follows one step commands with increased time Safety/Judgement: Decreased awareness of safety, Decreased awareness of deficits Awareness: (none) Problem Solving: Slow processing, Decreased initiation, Difficulty sequencing, Requires verbal cues, Requires tactile cues General Comments: pt requires max verbal and tactile cues to participate in  PT, unable to navigate around obstacles   Blood pressure 121/63, pulse 66, temperature 99.9 F (37.7 C), temperature source Oral, resp. rate 18, height 5' 7"  (1.702 m), weight 69.4 kg (153 lb), SpO2 99 %. Physical Exam  Nursing note and vitals  reviewed. Constitutional: He appears well-developed and well-nourished. He appears listless. No distress.  HENT:  Head: Normocephalic and atraumatic.  Eyes: Pupils are equal, round, and reactive to light. Conjunctivae and EOM are normal.  Neck: Normal range of motion. Neck supple.  Cardiovascular: Normal rate and regular rhythm.  Respiratory: Effort normal. No stridor. No respiratory distress. He has decreased breath sounds. He exhibits no tenderness.  GI: Soft. Bowel sounds are normal. He exhibits no distension. There is no tenderness.  Musculoskeletal: He exhibits no edema or tenderness.  Healing abrasions bilateral knees  Neurological: He appears listless. He is disoriented.  Soft voice.  Oriented to self only.  Left lean Unable to follow simple motor consistently Motor exam limited by inability to follow commands, however, spontaneously moving LUE/LLE.  Skin: Skin is warm and dry. He is not diaphoretic.  Psychiatric: His affect is blunt. His speech is delayed. He is slowed. Cognition and memory are impaired. He is inattentive.  Assessment/Plan: Diagnosis: Metabolic encephalopathy Labs and images independently reviewed.  Records reviewed and summated above.  1. Does the need for close, 24 hr/day medical supervision in concert with the patient's rehab needs make it unreasonable for this patient to be served in a less intensive setting? Potentially  2. Co-Morbidities requiring supervision/potential complications: PAF (monitor HR with increased activity, cont meds), T2DM with neuropathy (Monitor in accordance with exercise and adjust meds as necessary), RA (consider resuming meds), lumbar spondylosis with radiculopathy (s/p ESI 3/4), OA B/l knees (ensure pain does not limit therapies), falls with BLE weakness, SIADH (cont to monitor, fluid restrict), fevers (on IV abx, cont to monitor for signs and symptoms of infection, further workup if indicated), hypokalemia (continue to monitor and  replete as necessary), urinary retention (I/O caths), ABLA (transfuse if necessary to ensure appropriate perfusion for increased activity tolerance), hypothyroidism (cont meds, ensure appropriate mood and energy level for therapies)  3. Due to safety, disease management and patient education, does the patient require 24 hr/day rehab nursing? Yes 4. Does the patient require coordinated care of a physician, rehab nurse, PT (1-2 hrs/day, 5 days/week), OT (1-2 hrs/day, 5 days/week) and SLP (1-2 hrs/day, 5 days/week) to address physical and functional deficits in the context of the above medical diagnosis(es)? Yes Addressing deficits in the following areas: balance, endurance, locomotion, strength, transferring, bathing, dressing, toileting, cognition, language and psychosocial support 5. Can the patient actively participate in an intensive therapy program of at least 3 hrs of therapy per day at least 5 days per week? Potentially 6. The potential for patient to make measurable gains while on inpatient rehab is excellent 7. Anticipated functional outcomes upon discharge from inpatient rehab are min assist  with PT, min assist with OT, min assist and mod assist with SLP. 8. Estimated rehab length of stay to reach the above functional goals is: 19-24 days. 9. Anticipated D/C setting: Home 10. Anticipated post D/C treatments: HH therapy and Home excercise program 11. Overall Rehab/Functional Prognosis: good and fair  RECOMMENDATIONS: This patient's condition is appropriate for continued rehabilitative care in the following setting: Will need to inquire further regarding previous level of functioning as there appear to be conflicting histories.  If deficits (cognitive and functional) are acute in nature, recommend CR after  completion of medical workup and medically stable if caregiver support available.  Patient has agreed to participate in recommended program. Potentially Note that insurance prior  authorization may be required for reimbursement for recommended care.  Comment: Rehab Admissions Coordinator to follow up.  Delice Lesch, MD, ABPMR Bary Leriche, PA-C 08/16/2017          Revision History                             Routing History

## 2017-08-23 ENCOUNTER — Inpatient Hospital Stay (HOSPITAL_COMMUNITY): Payer: Medicare Other | Admitting: Physical Therapy

## 2017-08-23 ENCOUNTER — Encounter (HOSPITAL_COMMUNITY): Payer: Medicare Other | Admitting: Psychology

## 2017-08-23 ENCOUNTER — Inpatient Hospital Stay (HOSPITAL_COMMUNITY): Payer: Medicare Other | Admitting: Occupational Therapy

## 2017-08-23 ENCOUNTER — Inpatient Hospital Stay (HOSPITAL_COMMUNITY): Payer: Medicare Other | Admitting: Speech Pathology

## 2017-08-23 DIAGNOSIS — F05 Delirium due to known physiological condition: Secondary | ICD-10-CM

## 2017-08-23 LAB — BASIC METABOLIC PANEL
Anion gap: 7 (ref 5–15)
BUN: <5 mg/dL — ABNORMAL LOW (ref 6–20)
CALCIUM: 8.9 mg/dL (ref 8.9–10.3)
CHLORIDE: 105 mmol/L (ref 101–111)
CO2: 22 mmol/L (ref 22–32)
CREATININE: 0.79 mg/dL (ref 0.61–1.24)
GFR calc Af Amer: >60 mL/min (ref >60)
GFR calc non Af Amer: >60 mL/min (ref >60)
Glucose, Bld: 100 mg/dL — ABNORMAL HIGH (ref 65–99)
Potassium: 4 mmol/L (ref 3.5–5.1)
Sodium: 134 mmol/L — ABNORMAL LOW (ref 135–145)

## 2017-08-23 LAB — CBC WITH DIFFERENTIAL/PLATELET
Basophils Absolute: 0.1 10*3/uL (ref 0.0–0.1)
Basophils Relative: 1 %
EOS ABS: 0.2 10*3/uL (ref 0.0–0.7)
EOS PCT: 3 %
HEMATOCRIT: 35.7 % — AB (ref 39.0–52.0)
Hemoglobin: 12.2 g/dL — ABNORMAL LOW (ref 13.0–17.0)
Lymphocytes Relative: 36 %
Lymphs Abs: 2.7 10*3/uL (ref 0.7–4.0)
MCH: 32.5 pg (ref 26.0–34.0)
MCHC: 34.2 g/dL (ref 30.0–36.0)
MCV: 95.2 fL (ref 78.0–100.0)
MONO ABS: 0.8 10*3/uL (ref 0.1–1.0)
Monocytes Relative: 11 %
NEUTROS PCT: 49 %
Neutro Abs: 3.6 10*3/uL (ref 1.7–7.7)
Platelets: 246 10*3/uL (ref 150–400)
RBC: 3.75 MIL/uL — ABNORMAL LOW (ref 4.22–5.81)
RDW: 14.6 % (ref 11.5–15.5)
WBC: 7.4 10*3/uL (ref 4.0–10.5)

## 2017-08-23 LAB — GLUCOSE, CAPILLARY
GLUCOSE-CAPILLARY: 160 mg/dL — AB (ref 65–99)
Glucose-Capillary: 95 mg/dL (ref 65–99)
Glucose-Capillary: 110 mg/dL — ABNORMAL HIGH (ref 65–99)
Glucose-Capillary: 170 mg/dL — ABNORMAL HIGH (ref 65–99)

## 2017-08-23 NOTE — Care Management (Signed)
[facility] Individual Statement of Services  Patient Name:  EFRAIM VANALLEN  Date:  08/21/2017  Welcome to the [facility].  Our goal is to provide you with an individualized program based on your diagnosis and situation, designed to meet your specific needs.  With this comprehensive rehabilitation program, you will be expected to participate in at least 3 hours of rehabilitation therapies Monday-Friday, with modified therapy programming on the weekends.  Your rehabilitation program will include the following services:  Physical Therapy (PT), Occupational Therapy (OT), Speech Therapy (ST), 24 hour per day rehabilitation nursing, Neuropsychology, Case Management (Social Worker), Rehabilitation Medicine, Nutrition Services and Pharmacy Services  Weekly team conferences will be held on Tuesdays to discuss your progress.  Your Social Worker will talk with you frequently to get your input and to update you on team discussions.  Team conferences with you and your family in attendance may also be held.  Expected length of stay: 12 days    Overall anticipated outcome: supervision  Depending on your progress and recovery, your program may change. Your Social Worker will coordinate services and will keep you informed of any changes. Your Social Worker's name and contact numbers are listed  below.  The following services may also be recommended but are not provided by the [facility] will be made to provide these services after discharge if needed.  Arrangements include referral to agencies that provide these services.  Your insurance has been verified to be:  [facility] Medicare Your primary doctor is:  Dr. Ashby Dawes  Pertinent information will be shared with your doctor and your insurance company.  Social Worker:  [facility], [place] or ([phone]   Information discussed with and copy given to patient by: Lennart Pall, 08/21/2017, 12:30 PM

## 2017-08-23 NOTE — Progress Notes (Signed)
Physical Therapy Session Note  Patient Details  Name: [name] MRN: [id number] Date of Birth: 1940-10-17  Today's Date: 08/23/2017 PT Individual Time: 1300-1356 PT Individual Time Calculation (min): 56 min    Skilled Therapeutic Interventions/Progress Updates:    pt performs gait throughout unit with RW and supervision.  Gait with HHA side stepping and backward stepping with focus on hip strengthening and balance with pt requiring min A for balance especially with backward gait.  Berg balance test performed, pt scored 24/56 and was educated on high fall risk.  Pt performed step ups to 4'' step 4 x 5 bilat LEs with min/mod A for strengthening and balance.  nustep x 8 minutes level 4 for strengthening and activity tolerance.  Pt left in room with needs at hand, alarm set.  Therapy Documentation Pain: Pain Assessment Pain Score: 0-No pain  Balance: Standardized Balance Assessment Standardized Balance Assessment: Berg Balance Test Berg Balance Test Sit to Stand: Able to stand using hands after several tries Standing Unsupported: Able to stand 2 minutes with supervision Sitting with Back Unsupported but Feet Supported on Floor or Stool: Able to sit safely and securely 2 minutes Stand to Sit: Uses backs of legs against chair to control descent Transfers: Able to transfer safely, definite need of hands Standing Unsupported with Eyes Closed: Able to stand 10 seconds with supervision Standing Ubsupported with Feet Together: Needs help to attain position but able to stand for 30 seconds with feet together From Standing, Reach Forward with Outstretched Arm: Reaches forward but needs supervision From Standing Position, Pick up Object from Floor: Able to pick up shoe, needs supervision From Standing Position, Turn to Look Behind Over each Shoulder: Turn sideways only but maintains balance Turn 360 Degrees: Needs assistance while turning Standing Unsupported, Alternately Place Feet on  Step/Stool: Needs assistance to keep from falling or unable to try Standing Unsupported, One Foot in Front: Loses balance while stepping or standing Standing on One Leg: Unable to try or needs assist to prevent fall Total Score: 24   See Function Navigator for Current Functional Status.   Therapy/Group: Individual Therapy  Malaysha Arlen 08/23/2017, 1:18 PM

## 2017-08-23 NOTE — Progress Notes (Signed)
Occupational Therapy Session Note  Patient Details  Name: [name] MRN: [id number] Date of Birth: 04-Jun-1940  Today's Date: 08/23/2017 OT Individual Time: 6190-1222 OT Individual Time Calculation (min): 60 min    Short Term Goals: Week 1:  OT Short Term Goal 1 (Week 1): Pt will stand at sink for 2 grooming items without seated rest break and CGA OT Short Term Goal 2 (Week 1): Pt will complete toilet transfer wiht LRAD and CGA OT Short Term Goal 3 (Week 1): Pt will thread BLE into pants wiht AE PRN and supervision OT Short Term Goal 4 (Week 1): Pt will complete clothing managment wiht touching A     Skilled Therapeutic Interventions/Progress Updates:    Pt seen this session for ADL training and mobility training with a focus on Safety awareness and sequencing.  Pt received in bed and was able to sit to EOB from flat bed and no rails with S.  Pt tends to reach for the walker before standing. Reviewed and practiced sit to stands several times by pushing up with B hands and pt demonstrated improved control and smoothness of movement. Pt used RW to ambulate to toilet with steadying A and then sat on toilet with S.  He did not need to toilet and declined taking a full bath or shower.  He doffed and donned his underwear and pants from toilet with S.  Then donned his socks and shoes from arm chair in room.  Pt ambulated to sink to brush teeth.  When he went to sit down, he pushed walker out of the way and attempted to step towards w/c without support of the walker. Pt needed mod A to steady his balance.  Pt then completed UB bathing and dressing.  Spent much of the session reviewing safety with transfers with RW.  He practiced walking from w/c to arm chair to recliner and back for a total of 6 transfers. Cues for forward lean for a controlled descent.   Addressed cognition by asking pt to explain what he had been working on in speech therapy.  He only understood very basic concepts, but said he  spent time taking tests.  To work on memory, processing had pt read an article from newspaper and then explain the message of the article to the OT.  Pt was able to do this task fairly well.  Pt resting in w/c with quick release belt on and all needs met.   Therapy Documentation Precautions:  Precautions Precautions: Fall Precaution Comments: up with assist only Restrictions Weight Bearing Restrictions: No   Pain: Pain Assessment Pain Score: 0-No pain ADL:      See Function Navigator for Current Functional Status.   Therapy/Group: Individual Therapy  Pimaco Two 08/23/2017, 8:49 AM

## 2017-08-23 NOTE — Progress Notes (Signed)
Social Work Patient ID: [name], male   DOB: 10/09/1940, 76 y.o.   MRN: [id number]   Met with pt and wife this morning to review team conference. Both aware and agreeable with targeted d/c date of 4/6 and supervision goals overall.  Wife confirms that she will be able to provide the care.  She also is agreed to complete education on Friday 9-12.  Continue to follow.  , , LCSW 

## 2017-08-23 NOTE — Progress Notes (Signed)
Physical Therapy Note  Patient Details  Name: KEJUAN BEKKER MRN: [id number] Date of Birth: 04-24-1941 Today's Date: 08/23/2017    Time: 1030-1057 27 minutes  1:1 No c/o pain.  Pt performs gait throughout unit with RW and supervision.  without RW in room pt requires furniture walking or min A. therex with 2# wt bilat LEs 2 x 10 standing hip abd, ext, HS curl, mini squats, heel raises, seated LAQ, sit to stand x 10.  Pt left in room with needs at hand.  Paulette Lynch 08/23/2017, 10:59 AM

## 2017-08-23 NOTE — Consult Note (Signed)
Neuropsychological Consultation   Patient:   [name]   DOB:   Oct 01, 1940  MR Number:  [id number]  Location:  [facility] [address] [id number] [place] [address] Dept: [phone] KNL: [phone]           Date of Service:   08/23/2017  Start Time:   2 PM End Time:   3 PM  Provider/Observer:  Ilean Skill, Psy.D.       Clinical Neuropsychologist       Billing Code/Service: 90240 4 Units  Chief Complaint:    [name] is a 77 year old male with history of PAF, T2DM and neuropathy, RA, lumbar spondylosis with radiculopathy s/p ESI 3/4 and OA B-knees, falls with BLE weakness, difficulty walking and had negative MRI brain/spine on ED visit 3/17.  Symptoms continued to worsen with onset of sundowning and was admitted on 08/11/2017 for work up.  Family reported gradual steady memory decline with obserbable worsening I past few weeks to months.  Imaging suggested not acute or subacute processes but chronic SVD and subcortical white matter changes with cerebral atrophy.  Neurology felt subacute changes over past few months may be due to metabolic encephalopathy due to acute hyponatremia, possible ETOH withdrawal vs Wernicke's encephalopathy vs underlying dementia or sensory dementia.  Medical chart indicates daily alcohol use and he was started on IV thiamine.  During clinical interview today patient denied daily ETOH use and only a few days with drinking wine.    Reason for Service:  HPI: [name] is a 77 y.o. 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 He was discharged to home but continued to worsen with onset of sundowning and was admitted 08/11/17 for work up. Family reported gradual memory decline which has  been worse in the past few weeks to months. CT head reviewed, unremarkable for acute intracranial process. Per report, chronic microangiopathic ischemic changes of periventricular and subcortical white matter with cerebral atrophy. He was noted to be hyponatremic with Na-126. CXR negative. UDS negative. Respiratory panel negative. Patient noted to be somnolent with significant cognitive deficits.  Neurology consulted and felt that patient with metabolic encephalopathy due to acute hyponatremia, possible ETOH withdrawal v/s Wernicke's encephalopathy v/s underlying dementia or sensory ataxia. He has history of daily alcohol use and was started on IV thiamine. He developed fever of 102 on 3/25, was pan cultured and was started on IV Zosyn for likely aspiration LLL PNA. Foley placed briefly for urinary retention. MRI thoracic spine showed mild degenerative spondylosis but no acute abnormality. mentation improving and urinary retention has resolved. Therapy ongoing and CIR recommended due to functional decline.  Current Status:  While the patient has improved some cognitive functioning during hospital course the continue to be stable and continuing cogntive deficits.  These are related to overall speed of mental operations and free recall and some cued recall memory deficits.  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  The patient does not attempt to confabulate to adjust to memory deficits.  He does stay on topics that are well learned and deficits appear to be assoiated with new learning events.  Balance and gait changes are also likely related to the underlying SVD and any metabolic issues exacerbating symptoms.   Behavioral Observation: [name]  presents as a 76 y.o.-year-old [place] asian  Male who appeared his stated age. his  dress was Appropriate and he was Well Groomed and his manners were Appropriate to the situation.  his participation was indicative of Appropriate behaviors.  There were any physical disabilities noted.  he displayed an appropriate level of cooperation and motivation.     Interactions:    Active Appropriate and Redirectable  Attention:   abnormal and attention span appeared shorter than expected for age  Memory:   abnormal; remote memory intact, recent memory impaired mostly recall of recently learned information  Visuo-spatial:  not examined  Speech (Volume):  low  Speech:   normal;   Thought Process:  Coherent and Relevant  Though Content:  WNL; not suicidal and not homicidal  Orientation:   person, place, time/date and situation  Judgment:   Fair  Planning:   Fair  Affect:    Lethargic  Mood:    Dysphoric  Insight:   Fair  Intelligence:   very high  Medical History:   Past Medical History:  Diagnosis Date  . DM (diabetes mellitus) ([facility])   . GERD (gastroesophageal reflux disease)   . Hyperlipidemia   . Hypertension   . Legionella pneumonia ([place])   . Osteoarthritis   . Osteopenia   . Rheumatoid arthritis ([facility])   . Thyroid disease    Psychiatric History:  No prior history of depression  Family Med/Psych History:  Family History  Problem Relation Age of Onset  . Diabetes Mellitus I Father   . Diabetes Mellitus I Mother   . CAD Brother     Risk of Suicide/Violence: virtually non-existent   Impression/DX:  [name] is a 77 year old male with history of PAF, T2DM and neuropathy, RA, lumbar spondylosis with radiculopathy s/p ESI 3/4 and OA B-knees, falls with BLE weakness, difficulty walking and had negative MRI brain/spine on ED visit 3/17.  Symptoms continued to worsen with onset of sundowning and was admitted on 08/11/2017 for work up.  Family reported gradual steady memory decline with obserbable worsening I past few weeks to months.  Imaging  suggested not acute or subacute processes but chronic SVD and subcortical white matter changes with cerebral atrophy.  Neurology felt subacute changes over past few months may be due to metabolic encephalopathy due to acute hyponatremia, possible ETOH withdrawal vs Wernicke's encephalopathy vs underlying dementia or sensory dementia.  Medical chart indicates daily alcohol use and he was started on IV thiamine.  During clinical interview today patient denied daily ETOH use and only a few days with drinking wine.    While the patient has improved some cognitive functioning during hospital course the continue to be stable and continuing cogntive deficits.  These are related to overall speed of mental operations and free recall and some cued recall memory deficits.  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  The patient does not attempt to confabulate to adjust to memory deficits.  He does stay on topics that are well learned and deficits appear to be assoiated with new learning events.  Balance and gait changes are also likely related to the underlying SVD and any metabolic issues exacerbating symptoms.  Diagnosis:   White matter SVD, microvascular disease        Electronically Signed   _______________________ Ilean Skill, Psy.D.

## 2017-08-23 NOTE — Progress Notes (Signed)
Subjective/Complaints: Patient states that he feels well.  He denies any pain in his back or knees.  He was able to describe some of the things he was doing in therapy yesterday.  No problems sleeping.  ROS: Patient denies fever, rash, sore throat, blurred vision, nausea, vomiting, diarrhea, cough, shortness of breath or chest pain, joint or back pain, headache, or mood change.   Objective: Vital Signs: Blood pressure 135/76, pulse 62, temperature 98.3 F (36.8 C), temperature source Oral, resp. rate 17, height _0  (1.702 m), weight 70.7 kg (155 lb 14.4 oz), SpO2 100 %. Dg Chest 2 View  Result Date: 08/21/2017 CLINICAL DATA:  History of left lower lobe pneumonia.  Follow-up. EXAM: CHEST - 2 VIEW COMPARISON:  08/14/2017 FINDINGS: Cardiomediastinal silhouette is normal. Mediastinal contours appear intact. There is no evidence of pneumothorax. Mild peribronchial airspace consolidation the left lower lobe. Probable small left pleural effusion. Osseous structures are without acute abnormality. Soft tissues are grossly normal. IMPRESSION: Persistent peribronchial airspace opacities in the left lower lobe, and small left pleural effusion. Evaluation with CT of the chest without contrast may be considered, if further imaging evaluation is desired. Electronically Signed   By: Fidela Salisbury M.D.   On: 08/21/2017 13:42   Results for orders placed or performed during the hospital encounter of 08/18/17 (from the past 72 hour(s))  Glucose, capillary     Status: Abnormal   Collection Time: 08/20/17 12:04 PM  Result Value Ref Range   Glucose-Capillary 211 (H) 65 - 99 mg/dL  Glucose, capillary     Status: None   Collection Time: 08/20/17  4:37 PM  Result Value Ref Range   Glucose-Capillary 80 65 - 99 mg/dL  Glucose, capillary     Status: Abnormal   Collection Time: 08/20/17  9:23 PM  Result Value Ref Range   Glucose-Capillary 130 (H) 65 - 99 mg/dL   Comment 1 Notify RN   Basic metabolic panel      Status: Abnormal   Collection Time: 08/21/17  6:28 AM  Result Value Ref Range   Sodium 135 135 - 145 mmol/L   Potassium 3.4 (L) 3.5 - 5.1 mmol/L    Comment: SLIGHT HEMOLYSIS   Chloride 106 101 - 111 mmol/L   CO2 20 (L) 22 - 32 mmol/L   Glucose, Bld 91 65 - 99 mg/dL   BUN 5 (L) 6 - 20 mg/dL   Creatinine, Ser 0.82 0.61 - 1.24 mg/dL   Calcium 8.5 (L) 8.9 - 10.3 mg/dL   GFR calc non Af Amer >60 >60 mL/min   GFR calc Af Amer >60 >60 mL/min    Comment: (NOTE) The eGFR has been calculated using the CKD EPI equation. This calculation has not been validated in all clinical situations. eGFR's persistently <60 mL/min signify possible Chronic Kidney Disease.    Anion gap 9 5 - 15    Comment: Performed at [facility] [address] [place], [place] [address]  Glucose, capillary     Status: None   Collection Time: 08/21/17  6:53 AM  Result Value Ref Range   Glucose-Capillary 90 65 - 99 mg/dL   Comment 1 Notify RN   Glucose, capillary     Status: Abnormal   Collection Time: 08/21/17 12:49 PM  Result Value Ref Range   Glucose-Capillary 157 (H) 65 - 99 mg/dL  Glucose, capillary     Status: Abnormal   Collection Time: 08/21/17  4:59 PM  Result Value Ref Range  Glucose-Capillary 129 (H) 65 - 99 mg/dL  Glucose, capillary     Status: Abnormal   Collection Time: 08/21/17  8:55 PM  Result Value Ref Range   Glucose-Capillary 163 (H) 65 - 99 mg/dL  Glucose, capillary     Status: None   Collection Time: 08/22/17  6:43 AM  Result Value Ref Range   Glucose-Capillary 90 65 - 99 mg/dL  Glucose, capillary     Status: Abnormal   Collection Time: 08/22/17 11:57 AM  Result Value Ref Range   Glucose-Capillary 108 (H) 65 - 99 mg/dL  Glucose, capillary     Status: Abnormal   Collection Time: 08/22/17  4:47 PM  Result Value Ref Range   Glucose-Capillary 157 (H) 65 - 99 mg/dL  Glucose, capillary     Status: Abnormal   Collection Time: 08/22/17  9:14 PM  Result Value Ref Range    Glucose-Capillary 206 (H) 65 - 99 mg/dL  CBC with Differential/Platelet     Status: Abnormal (Preliminary result)   Collection Time: 08/23/17  6:21 AM  Result Value Ref Range   WBC 7.4 4.0 - 10.5 K/uL   RBC 3.75 (L) 4.22 - 5.81 MIL/uL   Hemoglobin 12.2 (L) 13.0 - 17.0 g/dL    Comment: REPEATED TO VERIFY   HCT 35.7 (L) 39.0 - 52.0 %   MCV 95.2 78.0 - 100.0 fL   MCH 32.5 26.0 - 34.0 pg   MCHC 34.2 30.0 - 36.0 g/dL   RDW 14.6 11.5 - 15.5 %   Platelets 246 150 - 400 K/uL    Comment: Performed at [facility], [place] [address] [place], [place] [address]   Neutrophils Relative % PENDING %   Neutro Abs PENDING 1.7 - 7.7 K/uL   Band Neutrophils PENDING %   Lymphocytes Relative PENDING %   Lymphs Abs PENDING 0.7 - 4.0 K/uL   Monocytes Relative PENDING %   Monocytes Absolute PENDING 0.1 - 1.0 K/uL   Eosinophils Relative PENDING %   Eosinophils Absolute PENDING 0.0 - 0.7 K/uL   Basophils Relative PENDING %   Basophils Absolute PENDING 0.0 - 0.1 K/uL   WBC Morphology PENDING    RBC Morphology PENDING    Smear Review PENDING    nRBC PENDING 0 /100 WBC   Metamyelocytes Relative PENDING %   Myelocytes PENDING %   Promyelocytes Absolute PENDING %   Blasts PENDING %  Glucose, capillary     Status: None   Collection Time: 08/23/17  6:50 AM  Result Value Ref Range   Glucose-Capillary 95 65 - 99 mg/dL   Comment 1 Notify RN      Constitutional: No distress . Vital signs reviewed. HEENT: EOMI, oral membranes moist Cardiovascular: RRR without murmur. No JVD    Respiratory: CTA Bilaterally without wheezes or rales. Normal effort    GI: BS +, non-tender, non-distended   Extremity:  Pulses positive and No Edema Skin:   Intact Neuro: Oriented to person and place, month and year.  Recalls activities he performed with therapy yesterday.  Follows all basic commands..  Abnormal Motor 4/5 bilateral deltoid bicep tricep grip hip flexor knee extensor ankle dorsiflexor.  Inconsistent sensory exam   Musc/Skel: Minimal low back pain Psych: Pleasant, cooperative   Assessment/Plan: 1. Functional deficits secondary to metabolic encephalopathy and deconditioning which require 3+ hours per day of interdisciplinary therapy in a comprehensive inpatient rehab setting. Physiatrist is providing close team supervision and 24 hour management of active medical problems listed below.  Physiatrist and rehab team continue to assess barriers to discharge/monitor patient progress toward functional and medical goals. FIM: Function - Bathing Body parts bathed by patient: Right arm, Left arm, Chest, Abdomen, Front perineal area, Right upper leg, Left upper leg, Buttocks, Right lower leg, Left lower leg, Back Body parts bathed by helper: Buttocks, Back, Left lower leg, Right lower leg Assist Level: Touching or steadying assistance(Pt > 75%)  Function- Upper Body Dressing/Undressing What is the patient wearing?: Pull over shirt/dress, Button up shirt Pull over shirt/dress - Perfomed by patient: Thread/unthread right sleeve, Thread/unthread left sleeve, Put head through opening, Pull shirt over trunk Button up shirt - Perfomed by patient: Thread/unthread right sleeve, Thread/unthread left sleeve, Pull shirt around back, Button/unbutton shirt Assist Level: Supervision or verbal cues Function - Lower Body Dressing/Undressing What is the patient wearing?: Non-skid slipper socks, Pants, Underwear Underwear - Performed by patient: Thread/unthread right underwear leg, Thread/unthread left underwear leg, Pull underwear up/down Underwear - Performed by helper: Thread/unthread right underwear leg, Thread/unthread left underwear leg, Pull underwear up/down Pants- Performed by patient: Thread/unthread right pants leg, Thread/unthread left pants leg, Pull pants up/down Pants- Performed by helper: Thread/unthread right pants leg, Thread/unthread left pants leg, Pull pants up/down Non-skid slipper socks- Performed by  patient: Don/doff right sock, Don/doff left sock Non-skid slipper socks- Performed by helper: Don/doff right sock, Don/doff left sock Assist for footwear: Supervision/touching assist Assist for lower body dressing: Touching or steadying assistance (Pt > 75%)  Function - Toileting Toileting steps completed by patient: Adjust clothing prior to toileting, Performs perineal hygiene, Adjust clothing after toileting(Patient wiped himself inadequatly but would not allow assistance.) Toileting steps completed by helper: Adjust clothing after toileting Toileting Assistive Devices: Grab bar or rail Assist level: Touching or steadying assistance (Pt.75%)  Function - Air cabin crew transfer assistive device: Elevated toilet seat/BSC over toilet Assist level to toilet: Supervision or verbal cues Assist level from toilet: Supervision or verbal cues  Function - Chair/bed transfer Chair/bed transfer method: Ambulatory Chair/bed transfer assist level: Supervision or verbal cues Chair/bed transfer assistive device: Bedrails, Armrests Chair/bed transfer details: Verbal cues for technique, Verbal cues for precautions/safety, Verbal cues for sequencing  Function - Locomotion: Wheelchair Will patient use wheelchair at discharge?: (TBD) Type: Manual Max wheelchair distance: 75' Assist Level: Supervision or verbal cues Assist Level: Supervision or verbal cues Wheel 150 feet activity did not occur: Safety/medical concerns Turns around,maneuvers to table,bed, and toilet,negotiates 3% grade,maneuvers on rugs and over doorsills: No Function - Locomotion: Ambulation Assistive device: Walker-rolling Max distance: 55' Assist level: Touching or steadying assistance (Pt > 75%) Assist level: Touching or steadying assistance (Pt > 75%) Assist level: Touching or steadying assistance (Pt > 75%) Walk 150 feet activity did not occur: Safety/medical concerns Walk 10 feet on uneven surfaces activity did not  occur: Safety/medical concerns  Function - Comprehension Comprehension: Auditory Comprehension assist level: Follows basic conversation/direction with no assist  Function - Expression Expression: Verbal Expression assist level: Expresses basic needs/ideas: With extra time/assistive device  Function - Social Interaction Social Interaction assist level: Interacts appropriately 75 - 89% of the time - Needs redirection for appropriate language or to initiate interaction.  Function - Problem Solving Problem solving assist level: Solves basic 50 - 74% of the time/requires cueing 25 - 49% of the time  Function - Memory Memory assist level: Recognizes or recalls 25 - 49% of the time/requires cueing 50 - 75% of the time Patient normally able to recall (first 3 days only): Staff names and  faces, That he or she is in a hospital  Medical Problem List and Plan:  1. 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   -Continue to work on safety awareness and cognition as well as family education and training. 2. DVT Prophylaxis/Anticoagulation: Pharmaceutical: Other (comment) Eliquis no evidence of bleeding  3. Pain Management: pt with hx of RA and lumbar spondylosis/ radiculopathy, OA knees  -tylenol prn  -voltaren gel prn  -kpad for back -Pain appears well-controlled at present 4. Mood: LCSW to follow for evaluation and support.  5. Neuropsych: This patient is not fully capable of making decisions on his own behalf.    -Wife indicates that he was a daily drinker starting at 4 PM and into the evening.  She states that typically when he would wake up the next day "he would be fine".  I indicated to her that some of the cognitive deficits that we are seeing are likely related to his chronic alcohol abuse in the context of some of the metabolic and infectious processes going on.  MRI reveals chronic ischemic  microangiopathy as well. 6. Skin/Wound Care: routine pressure relief measures.  7. Fluids/Electrolytes/Nutrition: Continue to encourage p.o. intake.   8. Aspiration PNA: -Chest x-ray reviewed: Left lower lobe ?opacities but generally unimpressive.    -Zosyn stopped     -Continue to monitor clinically.   -Follow-up CBC reviewed and white blood cell count 7.4 today 4/3 9. Hyponatremia: Sodium up to 135 on 08/21/2017 10 PAF: On Eliquis, metoprolol and tambocor (lower dose per cards). Monitor HR bid.  Heart rate in the 60s +  11. BPH: off Flomax due to orthostasis? Monitor for voiding for now.  12.  Transaminasemia mild elevation question EtOH related 13.  Hypoalbuminemia-being supplemented  Greater than 50% of time during this encounter was spent counseling patient/family in regard to patient's cognitive status and prognosis as well as his history of substance abuse with wife.     LOS (Days) 5 A FACE TO FACE EVALUATION WAS PERFORMED  Meredith Staggers 08/23/2017, 8:26 AM

## 2017-08-23 NOTE — Progress Notes (Signed)
Speech Language Pathology Daily Session Note  Patient Details  Name: [name] MRN: [id number] Date of Birth: 28-Jan-1941  Today's Date: 08/23/2017 SLP Individual Time: 1000-1030 SLP Individual Time Calculation (min): 30 min  Short Term Goals: Week 1: SLP Short Term Goal 1 (Week 1): Pt will utilize external memory aids to facilitate recall of daily information with mod assist verbal cues.   SLP Short Term Goal 2 (Week 1): Pt will recognize and correct errors in the moment during basic, famiiliar tasks with mod assist verbal cues.  SLP Short Term Goal 3 (Week 1): Pt will complete basic, familiar tasks with mod assist verbal cues for functional problem solving.   SLP Short Term Goal 4 (Week 1): Pt will selectively attend to tasks in a mildly distracting environment for 15 minutes with min verbal cues for redirection.    Skilled Therapeutic Interventions: Skilled treatment session focused on cognitive goals. SLP facilitated session by providing Supervision verbal cues for problem solving during an unfinished complex medication management task. Pt required Mod A verbal cues to recall functions of current medications with utilization of the medication list he used yesterday. Pt was able to self-monitor and correct errors with Min A verbal cues during task. Pt demonstrated selective attention in a mildly distracting environment for ~20 minutes with Min A verbal cues for redirection. Pt left upright in wheelchair with quick release belt on and all needs within reach. Continue with current plan of care.      Function:  Cognition Comprehension Comprehension assist level: Follows basic conversation/direction with no assist  Expression   Expression assist level: Expresses basic needs/ideas: With extra time/assistive device  Social Interaction Social Interaction assist level: Interacts appropriately 75 - 89% of the time - Needs redirection for appropriate language or to initiate interaction.   Problem Solving Problem solving assist level: Solves basic 90% of the time/requires cueing < 10% of the time  Memory Memory assist level: Recognizes or recalls 50 - 74% of the time/requires cueing 25 - 49% of the time    Pain Pain Assessment Pain Score: 0-No pain  Therapy/Group: Individual Therapy  Meredeth Ide  SLP - Student 08/23/2017, 10:33 AM

## 2017-08-24 ENCOUNTER — Inpatient Hospital Stay (HOSPITAL_COMMUNITY): Payer: Medicare Other | Admitting: Occupational Therapy

## 2017-08-24 ENCOUNTER — Inpatient Hospital Stay (HOSPITAL_COMMUNITY): Payer: Medicare Other

## 2017-08-24 ENCOUNTER — Inpatient Hospital Stay (HOSPITAL_COMMUNITY): Payer: Medicare Other | Admitting: Speech Pathology

## 2017-08-24 ENCOUNTER — Other Ambulatory Visit: Payer: Self-pay

## 2017-08-24 LAB — GLUCOSE, CAPILLARY
GLUCOSE-CAPILLARY: 113 mg/dL — AB (ref 65–99)
GLUCOSE-CAPILLARY: 174 mg/dL — AB (ref 65–99)
Glucose-Capillary: 92 mg/dL (ref 65–99)
Glucose-Capillary: 94 mg/dL (ref 65–99)

## 2017-08-24 NOTE — Progress Notes (Signed)
Physical Therapy Note  Patient Details  Name: [name] MRN: [id number] Date of Birth: 22-Apr-1941 Today's Date: 08/24/2017  1445-1600, 75 min individual tx Pain:none per pt  Bed> w/c stand pivot with min guard assist.    neuromuscular re-education via forced use and miltimodal cues for alternating reciprocal movement x 4 extremoites pm NuStep at level 3 x 6 minutes. .  Balance retraining standing on compliant mat with min/mod assist due to continuous LOB backwards, which pt is faintly aware of.  With sustained standing on wedge for bil LE stretching , and external pertubations and practice, pt noted to have decreased posterior bias in standing. Pt demonstrates minimal bil ankle strategy, delayed hip strategy and absent stepping strategy.  Reciprocal scooting for pelvic dissociation in sitting, forward/backward.  Standing activity using 1 hand to manipulate small objects on table in from of him, without support from other extemity, x 3 minutes x 2, without LOB. .  Advanced gait training:  without AD, backwards with mod cues for turning head, x 25' with mod assist and 1 LOB backwards. stepping forward/backward through agility ladder on floor, with mod assist; with improved balance strategies when stepping backwards.  Son arrived and questioned whether pt would be ready for d/c Sat.  He observed pt ambulate on level tile toward his room, with RW, supervision. Pt left resting in w/c with quick release belt donned and alarm set.   See function navigator for current status.  [name] 08/24/2017, 12:30 PM

## 2017-08-24 NOTE — Progress Notes (Signed)
Recreational Therapy Session Note  Patient Details  Name: [name] MRN: [id number] Date of Birth: 18-Apr-1941 Today's Date: 08/24/2017   Due to upcoming discharge date 4/6 and team recommendations, TR eval deferred.  [place] 08/24/2017, 9:29 AM

## 2017-08-24 NOTE — Progress Notes (Signed)
Occupational Therapy Session Note  Patient Details  Name: RAFE MACKOWSKI MRN: [id number] Date of Birth: 1940-10-18  Today's Date: 08/24/2017 OT Individual Time: 8592-9244 OT Individual Time Calculation (min): 57 min    Short Term Goals: Week 1:  OT Short Term Goal 1 (Week 1): Pt will stand at sink for 2 grooming items without seated rest break and CGA OT Short Term Goal 2 (Week 1): Pt will complete toilet transfer wiht LRAD and CGA OT Short Term Goal 3 (Week 1): Pt will thread BLE into pants wiht AE PRN and supervision OT Short Term Goal 4 (Week 1): Pt will complete clothing managment wiht touching A  Skilled Therapeutic Interventions/Progress Updates:    Upon entering the room, pt supine in bed with no c/o pain. Pt reports performing bathing and dressing prior to OT arrival. Pt donning shoes with increased time and supervision for safety. Pt ambulating with RW and supervision to [employer]. Pt engaged in dynavision in standing for 4 minute intervals. Pt standing with RW and then on foam wedge to increase challenge. Pt consistently hit the same amount of targets within time period with no LOB. Pt taking seated rest break and ambulating back to room in same manner. Pt returning to bed with call bell and all needed items within reach. Bed alarm activated.   Therapy Documentation Precautions:  Precautions Precautions: Fall Precaution Comments: up with assist only Restrictions Weight Bearing Restrictions: No General:   Vital Signs: Therapy Vitals Pulse Rate: 61 BP: 109/67 Patient Position (if appropriate): Lying  See Function Navigator for Current Functional Status.   Therapy/Group: Individual Therapy  Gypsy Decant 08/24/2017, 10:29 AM

## 2017-08-24 NOTE — Progress Notes (Signed)
Speech Language Pathology Daily Session Note  Patient Details  Name: [name] MRN: [id number] Date of Birth: 11/06/1940  Today's Date: 08/24/2017 SLP Individual Time: 1030-1130 SLP Individual Time Calculation (min): 60 min  Short Term Goals: Week 1: SLP Short Term Goal 1 (Week 1): Pt will utilize external memory aids to facilitate recall of daily information with mod assist verbal cues.   SLP Short Term Goal 2 (Week 1): Pt will recognize and correct errors in the moment during basic, famiiliar tasks with mod assist verbal cues.  SLP Short Term Goal 3 (Week 1): Pt will complete basic, familiar tasks with mod assist verbal cues for functional problem solving.   SLP Short Term Goal 4 (Week 1): Pt will selectively attend to tasks in a mildly distracting environment for 15 minutes with min verbal cues for redirection.    Skilled Therapeutic Interventions: Skilled treatment session focused on cognitive goals. SLP facilitated session by re-administering CLQT. Although pt continues to demonstrate mild deficits in memory and problem solving, pt's performance improved significantly from last administration on 08/19/2017 when he demonstrated moderate to severe deficits in all areas. Pt's attention and visuospatial skills appeared to be WNL. Throughout task, pt able to self-monitor and correct errors with Min A verbal cues. Pt also demonstrated selective attention to task in a mildly distracting environment for ~45 minutes with Mod I. Pt left supine in bed with alarm on and wife present. Continue with current plan of care.      Function:  Cognition Comprehension Comprehension assist level: Follows basic conversation/direction with no assist  Expression   Expression assist level: Expresses basic needs/ideas: With extra time/assistive device  Social Interaction Social Interaction assist level: Interacts appropriately with others with medication or extra time (anti-anxiety, antidepressant).   Problem Solving Problem solving assist level: Solves basic 75 - 89% of the time/requires cueing 10 - 24% of the time  Memory Memory assist level: Recognizes or recalls 75 - 89% of the time/requires cueing 10 - 24% of the time    Pain Pain Assessment: No/denies pain   Therapy/Group: Individual Therapy  Meredeth Ide  SLP - Student 08/24/2017, 3:41 PM

## 2017-08-24 NOTE — Progress Notes (Signed)
Subjective/Complaints: Patient finishing up with breakfast.  Denies pain.  Wife states that he had a good night.  ROS: Patient denies fever, rash, sore throat, blurred vision, nausea, vomiting, diarrhea, cough, shortness of breath or chest pain, joint or back pain, headache, or mood change. .   Objective: Vital Signs: Blood pressure 108/69, pulse 72, temperature 99 F (37.2 C), temperature source Oral, resp. rate 16, height _0  (1.702 m), weight 70.7 kg (155 lb 14.4 oz), SpO2 100 %. No results found. Results for orders placed or performed during the hospital encounter of 08/18/17 (from the past 72 hour(s))  Glucose, capillary     Status: Abnormal   Collection Time: 08/21/17  4:59 PM  Result Value Ref Range   Glucose-Capillary 129 (H) 65 - 99 mg/dL  Glucose, capillary     Status: Abnormal   Collection Time: 08/21/17  8:55 PM  Result Value Ref Range   Glucose-Capillary 163 (H) 65 - 99 mg/dL  Glucose, capillary     Status: None   Collection Time: 08/22/17  6:43 AM  Result Value Ref Range   Glucose-Capillary 90 65 - 99 mg/dL  Glucose, capillary     Status: Abnormal   Collection Time: 08/22/17 11:57 AM  Result Value Ref Range   Glucose-Capillary 108 (H) 65 - 99 mg/dL  Glucose, capillary     Status: Abnormal   Collection Time: 08/22/17  4:47 PM  Result Value Ref Range   Glucose-Capillary 157 (H) 65 - 99 mg/dL  Glucose, capillary     Status: Abnormal   Collection Time: 08/22/17  9:14 PM  Result Value Ref Range   Glucose-Capillary 206 (H) 65 - 99 mg/dL  Basic metabolic panel     Status: Abnormal   Collection Time: 08/23/17  6:21 AM  Result Value Ref Range   Sodium 134 (L) 135 - 145 mmol/L   Potassium 4.0 3.5 - 5.1 mmol/L   Chloride 105 101 - 111 mmol/L   CO2 22 22 - 32 mmol/L   Glucose, Bld 100 (H) 65 - 99 mg/dL   BUN <5 (L) 6 - 20 mg/dL   Creatinine, Ser 0.79 0.61 - 1.24 mg/dL   Calcium 8.9 8.9 - 10.3 mg/dL   GFR calc non Af Amer >60 >60 mL/min   GFR calc Af Amer >60 >60  mL/min    Comment: (NOTE) The eGFR has been calculated using the CKD EPI equation. This calculation has not been validated in all clinical situations. eGFR's persistently <60 mL/min signify possible Chronic Kidney Disease.    Anion gap 7 5 - 15    Comment: Performed at [facility] [address], [place], [place] [address]  CBC with Differential/Platelet     Status: Abnormal   Collection Time: 08/23/17  6:21 AM  Result Value Ref Range   WBC 7.4 4.0 - 10.5 K/uL   RBC 3.75 (L) 4.22 - 5.81 MIL/uL   Hemoglobin 12.2 (L) 13.0 - 17.0 g/dL    Comment: REPEATED TO VERIFY   HCT 35.7 (L) 39.0 - 52.0 %   MCV 95.2 78.0 - 100.0 fL   MCH 32.5 26.0 - 34.0 pg   MCHC 34.2 30.0 - 36.0 g/dL   RDW 14.6 11.5 - 15.5 %   Platelets 246 150 - 400 K/uL   Neutrophils Relative % 49 %   Lymphocytes Relative 36 %   Monocytes Relative 11 %   Eosinophils Relative 3 %   Basophils Relative 1 %   Neutro Abs 3.6  1.7 - 7.7 K/uL   Lymphs Abs 2.7 0.7 - 4.0 K/uL   Monocytes Absolute 0.8 0.1 - 1.0 K/uL   Eosinophils Absolute 0.2 0.0 - 0.7 K/uL   Basophils Absolute 0.1 0.0 - 0.1 K/uL   RBC Morphology POLYCHROMASIA PRESENT    WBC Morphology ATYPICAL LYMPHOCYTES     Comment: Performed at [facility], [place] [address], [place],  [address]  Glucose, capillary     Status: None   Collection Time: 08/23/17  6:50 AM  Result Value Ref Range   Glucose-Capillary 95 65 - 99 mg/dL   Comment 1 Notify RN   Glucose, capillary     Status: Abnormal   Collection Time: 08/23/17 11:39 AM  Result Value Ref Range   Glucose-Capillary 160 (H) 65 - 99 mg/dL  Glucose, capillary     Status: Abnormal   Collection Time: 08/23/17  4:45 PM  Result Value Ref Range   Glucose-Capillary 110 (H) 65 - 99 mg/dL  Glucose, capillary     Status: Abnormal   Collection Time: 08/23/17  8:41 PM  Result Value Ref Range   Glucose-Capillary 170 (H) 65 - 99 mg/dL  Glucose, capillary     Status: None   Collection Time: 08/24/17  6:38 AM   Result Value Ref Range   Glucose-Capillary 92 65 - 99 mg/dL  Glucose, capillary     Status: None   Collection Time: 08/24/17 11:31 AM  Result Value Ref Range   Glucose-Capillary 94 65 - 99 mg/dL     Constitutional: No distress . Vital signs reviewed. HEENT: EOMI, oral membranes moist Cardiovascular: RRR without murmur. No JVD    Respiratory: CTA Bilaterally without wheezes or rales. Normal effort    GI: BS +, non-tender, non-distended  Extremity:  Pulses positive and No Edema Skin:   Intact Neuro: Oriented to place person month and year.  Recalled some biographical information.  Follows all commands.  Abnormal Motor 4/5 bilateral deltoid bicep tricep grip hip flexor knee extensor ankle dorsiflexor.  Inconsistent sensory exam  Musc/Skel: Minimal low back pain Psych: Pleasant, cooperative   Assessment/Plan: 1. Functional deficits secondary to metabolic encephalopathy and deconditioning which require 3+ hours per day of interdisciplinary therapy in a comprehensive inpatient rehab setting. Physiatrist is providing close team supervision and 24 hour management of active medical problems listed below. Physiatrist and rehab team continue to assess barriers to discharge/monitor patient progress toward functional and medical goals. FIM: Function - Bathing Position: Wheelchair/chair at sink Body parts bathed by patient: Right arm, Left arm, Chest(pt declined to wash other areas) Body parts bathed by helper: Buttocks, Back, Left lower leg, Right lower leg Assist Level: Touching or steadying assistance(Pt > 75%)  Function- Upper Body Dressing/Undressing What is the patient wearing?: Pull over shirt/dress Pull over shirt/dress - Perfomed by patient: Thread/unthread right sleeve, Thread/unthread left sleeve, Put head through opening, Pull shirt over trunk Button up shirt - Perfomed by patient: Thread/unthread right sleeve, Thread/unthread left sleeve, Pull shirt around back, Button/unbutton  shirt Assist Level: Set up Function - Lower Body Dressing/Undressing What is the patient wearing?: Non-skid slipper socks, Pants, Underwear, Shoes Position: Other (comment)(sitting on toilet) Underwear - Performed by patient: Thread/unthread right underwear leg, Thread/unthread left underwear leg, Pull underwear up/down Underwear - Performed by helper: Thread/unthread right underwear leg, Thread/unthread left underwear leg, Pull underwear up/down Pants- Performed by patient: Thread/unthread right pants leg, Thread/unthread left pants leg, Pull pants up/down Pants- Performed by helper: Thread/unthread right pants leg, Thread/unthread left pants  leg, Pull pants up/down Non-skid slipper socks- Performed by patient: Don/doff right sock, Don/doff left sock Non-skid slipper socks- Performed by helper: Don/doff right sock, Don/doff left sock Shoes - Performed by patient: Don/doff right shoe, Don/doff left shoe, Fasten right, Fasten left Assist for footwear: Supervision/touching assist Assist for lower body dressing: Supervision or verbal cues  Function - Toileting Toileting steps completed by patient: Adjust clothing prior to toileting Toileting steps completed by helper: Performs perineal hygiene, Adjust clothing after toileting(per report) Toileting Assistive Devices: Grab bar or rail Assist level: Touching or steadying assistance (Pt.75%)  Function - Air cabin crew transfer assistive device: Grab bar, Walker Assist level to toilet: Supervision or verbal cues Assist level from toilet: Supervision or verbal cues  Function - Chair/bed transfer Chair/bed transfer method: Ambulatory Chair/bed transfer assist level: Supervision or verbal cues Chair/bed transfer assistive device: Walker Chair/bed transfer details: Verbal cues for technique, Verbal cues for precautions/safety, Verbal cues for sequencing  Function - Locomotion: Wheelchair Will patient use wheelchair at discharge?:  (TBD) Type: Manual Max wheelchair distance: 20' Assist Level: Supervision or verbal cues Assist Level: Supervision or verbal cues Wheel 150 feet activity did not occur: Safety/medical concerns Turns around,maneuvers to table,bed, and toilet,negotiates 3% grade,maneuvers on rugs and over doorsills: No Function - Locomotion: Ambulation Assistive device: Walker-rolling Max distance: 70 Assist level: Supervision or verbal cues Assist level: Supervision or verbal cues Assist level: Supervision or verbal cues Walk 150 feet activity did not occur: Safety/medical concerns Walk 10 feet on uneven surfaces activity did not occur: Safety/medical concerns  Function - Comprehension Comprehension: Auditory Comprehension assist level: Follows basic conversation/direction with no assist  Function - Expression Expression: Verbal Expression assist level: Expresses basic needs/ideas: With extra time/assistive device  Function - Social Interaction Social Interaction assist level: Interacts appropriately with others with medication or extra time (anti-anxiety, antidepressant).  Function - Problem Solving Problem solving assist level: Solves basic 75 - 89% of the time/requires cueing 10 - 24% of the time  Function - Memory Memory assist level: Recognizes or recalls 75 - 89% of the time/requires cueing 10 - 24% of the time Patient normally able to recall (first 3 days only): Staff names and faces, That he or she is in a hospital  Medical Problem List and Plan:  1. 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   -Focus on safety and cognition g. 2. DVT Prophylaxis/Anticoagulation: Pharmaceutical: Other (comment) Eliquis no evidence of bleeding  3. Pain Management: pt with hx of RA and lumbar spondylosis/ radiculopathy, OA knees  -Continue Tylenol and Voltaren gel as needed as well as heat -Pain appears well-controlled  at present 4. Mood: LCSW to follow for evaluation and support.  5. Neuropsych: This patient is not fully capable of making decisions on his own behalf.    -Per our discussion on Wednesday: Wife indicated that he was a daily drinker starting at 4 PM and into the evening.  She states that typically when he would wake up the next day "he would be fine".  I indicated to her that some of the cognitive deficits that we are seeing are likely related to his chronic alcohol abuse in the context of some of the metabolic and infectious processes going on.  MRI reveals chronic ischemic microangiopathy as well. 6. Skin/Wound Care: routine pressure relief measures.  7. Fluids/Electrolytes/Nutrition: Continue to encourage p.o. intake.   8. Aspiration PNA: -Chest x-ray reviewed: Left lower lobe ?opacities but generally  unimpressive.    -Zosyn stopped     -Continue to monitor clinically.   -Follow-up CBC reviewed and white blood cell count 7.4   4/3 9. Hyponatremia: Sodium up to 135 on 08/21/2017 10 PAF: On Eliquis, metoprolol and tambocor (lower dose per cards). Monitor HR bid.  Heart rate in the 60s +  11. BPH: off Flomax due to orthostasis? Monitor for voiding for now.  12.  Transaminasemia mild elevation question EtOH related 13.  Hypoalbuminemia-being supplemented.  Eating well   LOS (Days) 6 A FACE TO [place] 08/24/2017, 4:30 PM

## 2017-08-24 NOTE — Plan of Care (Signed)
  Problem: Consults Goal: RH BRAIN INJURY PATIENT EDUCATION Description Description: See Patient Education module for eduction specifics Outcome: Progressing   Problem: RH SKIN INTEGRITY Goal: RH STG SKIN FREE OF INFECTION/BREAKDOWN Description No new breakdown with supervision assist   Outcome: Progressing   Problem: RH SAFETY Goal: RH STG ADHERE TO SAFETY PRECAUTIONS W/ASSISTANCE/DEVICE Description STG Adhere to Safety Precautions With Supervision Assistance/Device.  Outcome: Progressing   Problem: RH COGNITION-NURSING Goal: RH STG ANTICIPATES NEEDS/CALLS FOR ASSIST W/ASSIST/CUES Description STG Anticipates Needs/Calls for Assist With Mod I Assistance/Cues.  Outcome: Progressing

## 2017-08-25 ENCOUNTER — Encounter (HOSPITAL_COMMUNITY): Payer: Medicare Other | Admitting: Occupational Therapy

## 2017-08-25 ENCOUNTER — Ambulatory Visit (HOSPITAL_COMMUNITY): Payer: Medicare Other | Admitting: Physical Therapy

## 2017-08-25 ENCOUNTER — Inpatient Hospital Stay (HOSPITAL_COMMUNITY): Payer: Medicare Other

## 2017-08-25 ENCOUNTER — Inpatient Hospital Stay (HOSPITAL_COMMUNITY): Payer: Medicare Other | Admitting: Speech Pathology

## 2017-08-25 LAB — GLUCOSE, CAPILLARY
GLUCOSE-CAPILLARY: 128 mg/dL — AB (ref 65–99)
Glucose-Capillary: 115 mg/dL — ABNORMAL HIGH (ref 65–99)
Glucose-Capillary: 98 mg/dL (ref 65–99)
Glucose-Capillary: 126 mg/dL — ABNORMAL HIGH (ref 65–99)

## 2017-08-25 MED ORDER — SACCHAROMYCES BOULARDII 250 MG PO CAPS: 250.0000 mg | ORAL_CAPSULE | Freq: Two times a day (BID) | ORAL | Status: DC

## 2017-08-25 MED ORDER — METFORMIN HCL ER 500 MG PO TB24
500.0000 mg | ORAL_TABLET | Freq: Every day | ORAL | Status: DC
  Administered 2017-08-26: 500 mg via ORAL
  Filled 2017-08-25: qty 1

## 2017-08-25 MED ORDER — POTASSIUM CHLORIDE CRYS ER 20 MEQ PO TBCR: 20.0000 meq | EXTENDED_RELEASE_TABLET | Freq: Every day | ORAL | 0 refills | Status: DC

## 2017-08-25 MED ORDER — TRAZODONE HCL 50 MG PO TABS: 50.0000 mg | ORAL_TABLET | Freq: Every evening | ORAL | 0 refills | Status: DC | PRN

## 2017-08-25 MED ORDER — METFORMIN HCL ER 500 MG PO TB24: 500.0000 mg | ORAL_TABLET | Freq: Every day | ORAL | 0 refills | Status: DC

## 2017-08-25 MED ORDER — ACETAMINOPHEN 325 MG PO TABS: 325.0000 mg | ORAL_TABLET | ORAL | Status: DC | PRN

## 2017-08-25 MED ORDER — THIAMINE HCL 100 MG PO TABS: 100.0000 mg | ORAL_TABLET | Freq: Every day | ORAL | 0 refills | Status: DC

## 2017-08-25 MED ORDER — ROSUVASTATIN CALCIUM 10 MG PO TABS: 10.0000 mg | ORAL_TABLET | Freq: Every day | ORAL | 0 refills | Status: AC

## 2017-08-25 MED ORDER — METOPROLOL TARTRATE 25 MG PO TABS: 25.0000 mg | ORAL_TABLET | Freq: Two times a day (BID) | ORAL | 0 refills | Status: DC

## 2017-08-25 MED ORDER — FOLIC ACID 1 MG PO TABS: 1.0000 mg | ORAL_TABLET | Freq: Every day | ORAL | 0 refills | Status: AC

## 2017-08-25 MED ORDER — FLECAINIDE ACETATE 50 MG PO TABS
50.0000 mg | ORAL_TABLET | Freq: Two times a day (BID) | ORAL | 0 refills | Status: DC
Start: 2017-08-25 — End: 2018-09-24

## 2017-08-25 NOTE — Progress Notes (Signed)
Subjective/Complaints: Patient sitting at edge of bed.  Excited to be leaving tomorrow.  ROS: Patient denies fever, rash, sore throat, blurred vision, nausea, vomiting, diarrhea, cough, shortness of breath or chest pain, joint or back pain, headache, or mood change.   Objective: Vital Signs: Blood pressure 116/67, pulse 64, temperature (!) 97.4 F (36.3 C), temperature source Oral, resp. rate 18, height 5' 7"  (1.702 m), weight 70.7 kg (155 lb 14.4 oz), SpO2 99 %. No results found. Results for orders placed or performed during the hospital encounter of 08/18/17 (from the past 72 hour(s))  Glucose, capillary     Status: Abnormal   Collection Time: 08/22/17 11:57 AM  Result Value Ref Range   Glucose-Capillary 108 (H) 65 - 99 mg/dL  Glucose, capillary     Status: Abnormal   Collection Time: 08/22/17  4:47 PM  Result Value Ref Range   Glucose-Capillary 157 (H) 65 - 99 mg/dL  Glucose, capillary     Status: Abnormal   Collection Time: 08/22/17  9:14 PM  Result Value Ref Range   Glucose-Capillary 206 (H) 65 - 99 mg/dL  Basic metabolic panel     Status: Abnormal   Collection Time: 08/23/17  6:21 AM  Result Value Ref Range   Sodium 134 (L) 135 - 145 mmol/L   Potassium 4.0 3.5 - 5.1 mmol/L   Chloride 105 101 - 111 mmol/L   CO2 22 22 - 32 mmol/L   Glucose, Bld 100 (H) 65 - 99 mg/dL   BUN <5 (L) 6 - 20 mg/dL   Creatinine, Ser 0.79 0.61 - 1.24 mg/dL   Calcium 8.9 8.9 - 10.3 mg/dL   GFR calc non Af Amer >60 >60 mL/min   GFR calc Af Amer >60 >60 mL/min    Comment: (NOTE) The eGFR has been calculated using the CKD EPI equation. This calculation has not been validated in all clinical situations. eGFR's persistently <60 mL/min signify possible Chronic Kidney Disease.    Anion gap 7 5 - 15    Comment: Performed at [facility] [address], [place], [place] [address]  CBC with Differential/Platelet     Status: Abnormal   Collection Time: 08/23/17  6:21 AM  Result Value Ref Range   WBC 7.4 4.0 - 10.5 K/uL   RBC 3.75 (L) 4.22 - 5.81 MIL/uL   Hemoglobin 12.2 (L) 13.0 - 17.0 g/dL    Comment: REPEATED TO VERIFY   HCT 35.7 (L) 39.0 - 52.0 %   MCV 95.2 78.0 - 100.0 fL   MCH 32.5 26.0 - 34.0 pg   MCHC 34.2 30.0 - 36.0 g/dL   RDW 14.6 11.5 - 15.5 %   Platelets 246 150 - 400 K/uL   Neutrophils Relative % 49 %   Lymphocytes Relative 36 %   Monocytes Relative 11 %   Eosinophils Relative 3 %   Basophils Relative 1 %   Neutro Abs 3.6 1.7 - 7.7 K/uL   Lymphs Abs 2.7 0.7 - 4.0 K/uL   Monocytes Absolute 0.8 0.1 - 1.0 K/uL   Eosinophils Absolute 0.2 0.0 - 0.7 K/uL   Basophils Absolute 0.1 0.0 - 0.1 K/uL   RBC Morphology POLYCHROMASIA PRESENT    WBC Morphology ATYPICAL LYMPHOCYTES     Comment: Performed at [facility], [address], [place], [place] [address]  Glucose, capillary     Status: None   Collection Time: 08/23/17  6:50 AM  Result Value Ref Range   Glucose-Capillary 95 65 -  99 mg/dL   Comment 1 Notify RN   Glucose, capillary     Status: Abnormal   Collection Time: 08/23/17 11:39 AM  Result Value Ref Range   Glucose-Capillary 160 (H) 65 - 99 mg/dL  Glucose, capillary     Status: Abnormal   Collection Time: 08/23/17  4:45 PM  Result Value Ref Range   Glucose-Capillary 110 (H) 65 - 99 mg/dL  Glucose, capillary     Status: Abnormal   Collection Time: 08/23/17  8:41 PM  Result Value Ref Range   Glucose-Capillary 170 (H) 65 - 99 mg/dL  Glucose, capillary     Status: None   Collection Time: 08/24/17  6:38 AM  Result Value Ref Range   Glucose-Capillary 92 65 - 99 mg/dL  Glucose, capillary     Status: None   Collection Time: 08/24/17 11:31 AM  Result Value Ref Range   Glucose-Capillary 94 65 - 99 mg/dL  Glucose, capillary     Status: Abnormal   Collection Time: 08/24/17  4:31 PM  Result Value Ref Range   Glucose-Capillary 113 (H) 65 - 99 mg/dL  Glucose, capillary     Status: Abnormal   Collection Time: 08/24/17  9:24 PM  Result Value Ref Range    Glucose-Capillary 174 (H) 65 - 99 mg/dL  Glucose, capillary     Status: Abnormal   Collection Time: 08/25/17  6:40 AM  Result Value Ref Range   Glucose-Capillary 115 (H) 65 - 99 mg/dL     Constitutional: No distress . Vital signs reviewed. HEENT: EOMI, oral membranes moist Cardiovascular: RRR without murmur. No JVD    Respiratory: CTA Bilaterally without wheezes or rales. Normal effort    GI: BS +, non-tender, non-distended   Extremity:  Pulses positive and No Edema Skin:   Intact Neuro: Alert and oriented to person place month and year.  Reasonable insight and awareness today.  Abnormal Motor 4/5 bilateral deltoid bicep tricep grip hip flexor knee extensor ankle dorsiflexor.  Inconsistent sensory exam  Musc/Skel: No back pain  psych: Pleasant, cooperative   Assessment/Plan: 1. Functional deficits secondary to metabolic encephalopathy and deconditioning which require 3+ hours per day of interdisciplinary therapy in a comprehensive inpatient rehab setting. Physiatrist is providing close team supervision and 24 hour management of active medical problems listed below. Physiatrist and rehab team continue to assess barriers to discharge/monitor patient progress toward functional and medical goals. FIM: Function - Bathing Position: Wheelchair/chair at sink Body parts bathed by patient: Right arm, Left arm, Chest(pt declined to wash other areas) Body parts bathed by helper: Buttocks, Back, Left lower leg, Right lower leg Assist Level: Touching or steadying assistance(Pt > 75%)  Function- Upper Body Dressing/Undressing What is the patient wearing?: Pull over shirt/dress Pull over shirt/dress - Perfomed by patient: Thread/unthread right sleeve, Thread/unthread left sleeve, Put head through opening, Pull shirt over trunk Button up shirt - Perfomed by patient: Thread/unthread right sleeve, Thread/unthread left sleeve, Pull shirt around back, Button/unbutton shirt Assist Level: Set up Function  - Lower Body Dressing/Undressing What is the patient wearing?: Non-skid slipper socks, Pants, Underwear, Shoes Position: Other (comment)(sitting on toilet) Underwear - Performed by patient: Thread/unthread right underwear leg, Thread/unthread left underwear leg, Pull underwear up/down Underwear - Performed by helper: Thread/unthread right underwear leg, Thread/unthread left underwear leg, Pull underwear up/down Pants- Performed by patient: Thread/unthread right pants leg, Thread/unthread left pants leg, Pull pants up/down Pants- Performed by helper: Thread/unthread right pants leg, Thread/unthread left pants leg, Pull pants up/down Non-skid slipper  socks- Performed by patient: Don/doff right sock, Don/doff left sock Non-skid slipper socks- Performed by helper: Don/doff right sock, Don/doff left sock Shoes - Performed by patient: Don/doff right shoe, Don/doff left shoe, Fasten right, Fasten left Assist for footwear: Supervision/touching assist Assist for lower body dressing: Supervision or verbal cues  Function - Toileting Toileting steps completed by patient: Adjust clothing prior to toileting, Adjust clothing after toileting, Performs perineal hygiene Toileting steps completed by helper: Performs perineal hygiene, Adjust clothing after toileting(per report) Toileting Assistive Devices: Grab bar or rail Assist level: Touching or steadying assistance (Pt.75%)  Function - Air cabin crew transfer assistive device: Grab bar, Walker Assist level to toilet: Supervision or verbal cues Assist level from toilet: Supervision or verbal cues  Function - Chair/bed transfer Chair/bed transfer method: Ambulatory Chair/bed transfer assist level: Supervision or verbal cues Chair/bed transfer assistive device: Walker Chair/bed transfer details: Verbal cues for technique, Verbal cues for precautions/safety, Verbal cues for sequencing  Function - Locomotion: Wheelchair Will patient use wheelchair  at discharge?: (TBD) Type: Manual Max wheelchair distance: 10' Assist Level: Supervision or verbal cues Assist Level: Supervision or verbal cues Wheel 150 feet activity did not occur: Safety/medical concerns Turns around,maneuvers to table,bed, and toilet,negotiates 3% grade,maneuvers on rugs and over doorsills: No Function - Locomotion: Ambulation Assistive device: Walker-rolling Max distance: 70 Assist level: Supervision or verbal cues Assist level: Supervision or verbal cues Assist level: Supervision or verbal cues Walk 150 feet activity did not occur: Safety/medical concerns Walk 10 feet on uneven surfaces activity did not occur: Safety/medical concerns  Function - Comprehension Comprehension: Auditory Comprehension assist level: Follows basic conversation/direction with no assist  Function - Expression Expression: Verbal Expression assist level: Expresses basic needs/ideas: With extra time/assistive device  Function - Social Interaction Social Interaction assist level: Interacts appropriately with others with medication or extra time (anti-anxiety, antidepressant).  Function - Problem Solving Problem solving assist level: Solves basic 75 - 89% of the time/requires cueing 10 - 24% of the time  Function - Memory Memory assist level: Recognizes or recalls 75 - 89% of the time/requires cueing 10 - 24% of the time Patient normally able to recall (first 3 days only): Staff names and faces, That he or she is in a hospital  Medical Problem List and Plan:  1. Functional and cognitive/mobility deficits secondary to multi-factorial gait disorder, metabolic encephalopathy  CIR PT OT, SLP----ELOS 4/6   -Had discussion with patient regarding the effects of alcohol and potential involvement in his clinical picture.  Wife was present as well.  Patient seemed to express an understanding but did not seem overly pleased to be discussing the topic with me.. 2. DVT Prophylaxis/Anticoagulation:  Pharmaceutical: Other (comment) Eliquis no evidence of bleeding  3. Pain Management: pt with hx of RA and lumbar spondylosis/ radiculopathy, OA knees  -Continue Tylenol and Voltaren gel as needed as well as heat -Pain appears well-controlled at present 4. Mood: LCSW to follow for evaluation and support.  5. Neuropsych: This patient is not fully capable of making decisions on his own behalf.    -Per our discussion on Wednesday: Wife indicated that he was a daily drinker starting at 4 PM and into the evening.  She states that typically when he would wake up the next day "he would be fine".  I indicated to her that some of the cognitive deficits that we are seeing are likely related to his chronic alcohol abuse in the context of some of the metabolic and infectious processes going  on.  MRI reveals chronic ischemic microangiopathy as well. 6. Skin/Wound Care: routine pressure relief measures.  7. Fluids/Electrolytes/Nutrition: Continue to encourage p.o. intake.   8. Aspiration PNA: -Chest x-ray reviewed: Left lower lobe ?opacities but generally unimpressive.    -Zosyn stopped      -Follow-up CBC reviewed and white blood cell count 7.4   4/3   -Continue activity out of bed and observe 9. Hyponatremia: Sodium up to 135 on 08/21/2017 10 PAF: On Eliquis, metoprolol and tambocor (lower dose per cards). Monitor HR bid.  Heart rate in the 60s +  11. BPH: off Flomax due to orthostasis? Monitor for voiding for now.  12.  Transaminasemia mild elevation question EtOH related 13.  Hypoalbuminemia-being supplemented.  Eating well   LOS (Days) 7 A FACE TO FACE EVALUATION WAS PERFORMED  Meredith Staggers 08/25/2017, 10:23 AM

## 2017-08-25 NOTE — Progress Notes (Signed)
Physical Therapy Session Note  Patient Details  Name: [name] MRN: [id number] Date of Birth: 1941-04-28  Today's Date: 08/25/2017 PT Individual Time: 1345-1430 PT Individual Time Calculation (min): 45 min   Short Term Goals: Week 1:  PT Short Term Goal 1 (Week 1): Pt will demonstrate safety awareness w/ functional mobility w/o cues 50% of time  PT Short Term Goal 2 (Week 1): Pt will ambulate 150' w/ min assist using LRAD PT Short Term Goal 3 (Week 1): Pt will particpate in OOB activity for 30 min w/o increase in fatigue  Skilled Therapeutic Interventions/Progress Updates:    Pt requires encouragement from PT and from pt's wife to participate in therapy session as pt states he exhausted. Extra time to come to EOB with pt demonstrating posterior LOB using railing to self correct. Assist to don shoes for time management. Pt requires min assist for transfers throughout session without RW for balance. Focused on NMR for balance re-training on compliant surface (foam) while performing functional reaching and tossing task with min assist and cues to decrease use of UE support with intermittent 1 UE support. Then progressed to balance and weightshifting on Bosu ball with pt demonstrating tendency for posterior bias - practiced anterior/posterior and lateral weightshifting. Pt attempted to maintain balance and take 1 UE off but only for a 1-2 seconds at a time. Performed Nustep for general strengthening and cardiovascular endurance x 6 min on level 4 with BUE/BLE. Returned to bed at end of session as described above with all needs in reach.   Therapy Documentation Precautions:  Precautions Precautions: Fall Precaution Comments: up with assist only Restrictions Weight Bearing Restrictions: No Pain: Denies pain.     See Function Navigator for Current Functional Status.   Therapy/Group: Individual Therapy  Canary Brim Ivory Broad, PT, DPT  08/25/2017, 2:57 PM

## 2017-08-25 NOTE — Plan of Care (Signed)
10/12 LTGs achieved 08/25/17

## 2017-08-25 NOTE — Discharge Instructions (Signed)
Inpatient Rehab Discharge Instructions  LATHON ADAN Discharge date and time:  08/26/17  Activities/Precautions/ Functional Status: Activity: no lifting, driving, or strenuous exercise till cleared by MD Diet: cardiac diet and diabetic diet Wound Care: none needed   Functional status:  ___ No restrictions     ___ Walk up steps independently _X__ 24/7 supervision/assistance   ___ Walk up steps with assistance ___ Intermittent supervision/assistance  ___ Bathe/dress independently ___ Walk with walker     _X__ Bathe/dress with assistance ___ Walk Independently    ___ Shower independently _X__ Walk with supervision    ___ Shower with assistance _X__ No alcohol     ___ Return to work/school ________    COMMUNITY REFERRALS UPON DISCHARGE:    Home Health:   PT    OT     ST                     Agency:  [facility] Phone: [phone]  Medical Equipment/Items Ordered: rolling walker, 3n1 and tub bench                                                     Agency/Supplier:  [facility] @ [phone]  Special Instructions: 1. Absolutely no alcohol.  2. No driving or strenuous activity till cleared by MD.  3. Monitor blood sugars 2-3 times a day before meals and/or at bedtime. Note medication changes. If blood sugars start running over 120 resume evening dose of metformin.    My questions have been answered and I understand these instructions. I will adhere to these goals and the provided educational materials after my discharge from the hospital.  Patient/Caregiver Signature _______________________________ Date __________  Clinician Signature _______________________________________ Date __________  Please bring this form and your medication list with you to all your follow-up doctor's appointments.   ------------  Information on my medicine - ELIQUIS (apixaban)  This medication education was reviewed with me or my healthcare representative as part of my  discharge preparation.    Why was Eliquis prescribed for you? Eliquis was prescribed for you to reduce the risk of a blood clot forming that can cause a stroke if you have a medical condition called atrial fibrillation (a type of irregular heartbeat).  What do You need to know about Eliquis ? Take your Eliquis TWICE DAILY - one tablet in the morning and one tablet in the evening with or without food. If you have difficulty swallowing the tablet whole please discuss with your pharmacist how to take the medication safely.  Take Eliquis exactly as prescribed by your doctor and DO NOT stop taking Eliquis without talking to the doctor who prescribed the medication.  Stopping may increase your risk of developing a stroke.  Refill your prescription before you run out.  After discharge, you should have regular check-up appointments with your healthcare provider that is prescribing your Eliquis.  In the future your dose may need to be changed if your kidney function or weight changes by a significant amount or as you get older.  What do you do if you miss a dose? If you miss a dose, take it as soon as you remember on the same day and resume taking twice daily.  Do not take more than one dose of ELIQUIS at the same time to make  up a missed dose.  Important Safety Information A possible side effect of Eliquis is bleeding. You should call your healthcare provider right away if you experience any of the following: ? Bleeding from an injury or your nose that does not stop. ? Unusual colored urine (red or dark brown) or unusual colored stools (red or black). ? Unusual bruising for unknown reasons. ? A serious fall or if you hit your head (even if there is no bleeding).  Some medicines may interact with Eliquis and might increase your risk of bleeding or clotting while on Eliquis. To help avoid this, consult your healthcare provider or pharmacist prior to using any new prescription or  non-prescription medications, including herbals, vitamins, non-steroidal anti-inflammatory drugs (NSAIDs) and supplements.  This website has more information on Eliquis (apixaban): http://www.eliquis.com/eliquis/home

## 2017-08-25 NOTE — Progress Notes (Signed)
Social Work  Discharge Note  The overall goal for the admission was met for:   Discharge location: Yes - home with wife who can provide 24/7 supervision  Length of Stay: Yes - 8 days (with 4/6 discharge)  Discharge activity level: Yes - supervision to min assist  Home/community participation: Yes  Services provided included: MD, RD, PT, OT, SLP, RN, TR, Pharmacy, [place]: [facility] Medicare  Follow-up services arranged: Home Health: PT, OT, ST via [facility], DME: rolling walker, 3n1 commode and tub bench via [facility] and Patient/Family has no preference for HH/DME agencies  Comments (or additional information):  Patient/Family verbalized understanding of follow-up arrangements: Yes  Individual responsible for coordination of the follow-up plan: pt and souse  Confirmed correct DME delivered: Tyshan Enderle 08/25/2017    Danna Casella

## 2017-08-25 NOTE — Progress Notes (Signed)
Speech Language Pathology Daily Session Note  Patient Details  Name: [name] MRN: [id number] Date of Birth: 10-25-1940  Today's Date: 08/25/2017 SLP Individual Time: 1030-1100 SLP Individual Time Calculation (min): 30 min  Short Term Goals: Week 1: SLP Short Term Goal 1 (Week 1): Pt will utilize external memory aids to facilitate recall of daily information with mod assist verbal cues.   SLP Short Term Goal 2 (Week 1): Pt will recognize and correct errors in the moment during basic, famiiliar tasks with mod assist verbal cues.  SLP Short Term Goal 3 (Week 1): Pt will complete basic, familiar tasks with mod assist verbal cues for functional problem solving.   SLP Short Term Goal 4 (Week 1): Pt will selectively attend to tasks in a mildly distracting environment for 15 minutes with min verbal cues for redirection.    Skilled Therapeutic Interventions: Skilled treatment session focused on completion of patient and family education with the patient's wife and daughter-in-law. All were educated on the importance of 24/7 supervision to ensure patient's overall safety due to impaired recall and problem solving which impacts his ability to complete functional and familiar tasks safely. All were also provided education in regards to strategies to utilize at home to maximize recall and carryover of functional information and way to incorporate cognitive functioning into daily tasks.  All verbalized understanding of information and handouts were given to reinforce information. Patient left supine in bed with alarm on and family present. Continue with current plan of care.      Function:   Cognition Comprehension Comprehension assist level: Follows basic conversation/direction with no assist  Expression   Expression assist level: Expresses basic needs/ideas: With extra time/assistive device  Social Interaction Social Interaction assist level: Interacts appropriately with others with medication  or extra time (anti-anxiety, antidepressant).  Problem Solving Problem solving assist level: Solves basic 75 - 89% of the time/requires cueing 10 - 24% of the time  Memory Memory assist level: Recognizes or recalls 75 - 89% of the time/requires cueing 10 - 24% of the time    Pain Pain Assessment Pain Scale: 0-10 Pain Score: 0-No pain  Therapy/Group: Individual Therapy  Ayonna Speranza 08/25/2017, 1:56 PM

## 2017-08-25 NOTE — Progress Notes (Signed)
Speech Language Pathology Discharge Summary  Patient Details  Name: [name] MRN: [id number] Date of Birth: May 01, 1941   Patient has met 4 of 4 long term goals.  Patient to discharge at [facility] level.   Clinical Impression/Discharge Summary: Pt has made functional gains and has met 4 out of 4 LTGs this admission due to improved cognitive functioning. Currently, pt requires overall Min A verbal cues for basic problem solving, recall of new information, and emergent awareness. Pt also demonstrates selective attention in a mildly distracting environment for ~45 minutes with Min A verbal cues. Pt education complete and pt will discharge home with 24-hour supervision from family. Pt would benefit from f/u home health SLP services to maximize cognitive function and overall functional independence.      Care Partner:  Caregiver Able to Provide Assistance: Yes  Type of Caregiver Assistance: Cognitive;Physical  Recommendation:  24 hour supervision/assistance;Home Health SLP  Rationale for SLP Follow Up: Maximize cognitive function and independence   Equipment: N/A  Reasons for discharge: Treatment goals met   Patient/Family Agrees with Progress Made and Goals Achieved: Yes     Meredeth Ide  SLP - Student 08/25/2017, 3:46 PM

## 2017-08-25 NOTE — Progress Notes (Addendum)
Physical Therapy Discharge Summary  Patient Details  Name: [name] MRN: [id number] Date of Birth: [birth year]  Today's Date: 08/25/2017 PT Individual Time: 1100-1145 PT Individual Time Calculation (min): 45 min   Pt and family participated in pt/family education. Pt's family educated on and demonstrated safe gait with RW with supervision, educated on close supervision for community settings.  Stair negotiation without handrails with wife able to provide min A.  Pt able to perform stairs with 2 handrails with supervision.  Simulated car transfer to sedan height car with pt's wife able to provide good cues for safety with RW and safe transfers.  Pt and family educated on importance of a schedule at home, energy conservation, recommendations for HHPT and continued physical activity.  Pt and wife able to perform household gait and toileting with wife providing close supervision.  Pt and family express understanding of PT recommendations and state they feel ready for d/c home tomorrow.  Patient has met 7 of 7 long term goals due to improved activity tolerance, improved balance, increased strength and ability to compensate for deficits.  Patient to discharge at an ambulatory level Supervision.   Patient's care partner is independent to provide the necessary supervision assistance at discharge.  Reasons goals not met: n/a  Recommendation:  Patient will benefit from ongoing skilled PT services in home health setting to continue to advance safe functional mobility, address ongoing impairments in balance, strength, activity tolerance, and minimize fall risk.  Equipment: RW  Reasons for discharge: treatment goals met and discharge from hospital  Patient/family agrees with progress made and goals achieved: Yes  PT Discharge Precautions/Restrictions Precautions Precautions: Fall Restrictions Weight Bearing Restrictions: No  Pain Pain Assessment Pain Scale: 0-10 Pain Score: 0-No  pain  Cognition Overall Cognitive Status: Impaired/Different from baseline Orientation Level: Oriented X4 Memory: Impaired Awareness: Impaired Awareness Impairment: Emergent impairment;Anticipatory impairment Safety/Judgment: Impaired Comments: Mild impulsivity  Sensation Sensation Light Touch: Appears Intact Proprioception: Appears Intact Coordination Gross Motor Movements are Fluid and Coordinated: Yes Fine Motor Movements are Fluid and Coordinated: Yes Motor  Motor Motor - Discharge Observations: generalized weakness   Trunk/Postural Assessment  Cervical Assessment Cervical Assessment: Within Functional Limits Thoracic Assessment Thoracic Assessment: Within Functional Limits Lumbar Assessment Lumbar Assessment: Within Functional Limits Postural Control Postural Control: Deficits on evaluation Righting Reactions: delayed  Balance Dynamic Standing Balance Dynamic Standing - Level of Assistance: 5: Stand by assistance Dynamic Standing - Comments: supervision with RW, Berg 24/56 Extremity Assessment      RLE Assessment RLE Assessment: (grossly 4-/5) LLE Assessment LLE Assessment: (grossly 4/5)   See Function Navigator for Current Functional Status.  DONAWERTH,KAREN 08/25/2017, 11:43 AM

## 2017-08-25 NOTE — Discharge Summary (Signed)
Physician Discharge Summary  Patient ID: [name] MRN: [id number] DOB/AGE: 1940-11-13 77 y.o.  Admit date: 08/18/2017 Discharge date: 08/26/2017  Discharge Diagnoses:  Principal Problem:   Encephalopathy Active Problems:   Gait disorder   Type 2 diabetes mellitus with other specified complication (HCC)   PAF (paroxysmal atrial fibrillation) (HCC)   Type 2 diabetes mellitus with peripheral neuropathy (HCC)   Hypokalemia   Discharged Condition: stable   Significant Diagnostic Studies: Dg Chest 2 View  Result Date: 08/21/2017 CLINICAL DATA:  History of left lower lobe pneumonia.  Follow-up. EXAM: CHEST - 2 VIEW COMPARISON:  08/14/2017 FINDINGS: Cardiomediastinal silhouette is normal. Mediastinal contours appear intact. There is no evidence of pneumothorax. Mild peribronchial airspace consolidation the left lower lobe. Probable small left pleural effusion. Osseous structures are without acute abnormality. Soft tissues are grossly normal. IMPRESSION: Persistent peribronchial airspace opacities in the left lower lobe, and small left pleural effusion. Evaluation with CT of the chest without contrast may be considered, if further imaging evaluation is desired. Electronically Signed   By: Fidela Salisbury M.D.   On: 08/21/2017 13:42   Dg Chest 2 View  Result Date: 08/13/2017 CLINICAL DATA:  SIADH EXAM: CHEST - 2 VIEW COMPARISON:  01/16/2016 FINDINGS: The heart size and mediastinal contours are within normal limits. Both lungs are clear. The visualized skeletal structures are unremarkable. IMPRESSION: No active cardiopulmonary disease. Electronically Signed   By: Inez Catalina M.D.   On: 08/13/2017 17:55   Ct Head Wo Contrast  Result Date: 08/12/2017 CLINICAL DATA:  Altered level of consciousness EXAM: CT HEAD WITHOUT CONTRAST TECHNIQUE: Contiguous axial images were obtained from the base of the skull through the vertex without intravenous contrast. COMPARISON:  CT 08/05/2017, MRI  08/06/2017 FINDINGS: BRAIN: There is sulcal and ventricular prominence consistent with superficial and central atrophy. No intraparenchymal hemorrhage, mass effect nor midline shift. Periventricular and subcortical white matter hypodensities consistent with chronic small vessel ischemic disease are identified. No acute large vascular territory infarcts. No abnormal extra-axial fluid collections. Basal cisterns and fourth ventricle are not effaced and midline. VASCULAR: Moderate calcific atherosclerosis of the carotid siphons. SKULL: No skull fracture. No significant scalp soft tissue swelling. SINUSES/ORBITS: The mastoid air-cells are clear. The included paranasal sinuses are well-aerated.The included ocular globes and orbital contents are non-suspicious. OTHER: None. 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 No acute intracranial abnormality. Cerebral atrophy. Electronically Signed   By: Ashley Royalty M.D.   On: 08/12/2017 19:31   Ct Head Wo Contrast  Result Date: 08/06/2017 CLINICAL DATA:  Muscle weakness.  Fall. EXAM: CT HEAD WITHOUT CONTRAST TECHNIQUE: Contiguous axial images were obtained from the base of the skull through the vertex without intravenous contrast. COMPARISON:  03/16/2004 head CT. FINDINGS: Brain: No evidence of parenchymal hemorrhage or extra-axial fluid collection. No mass lesion, mass effect, or midline shift. No CT evidence of acute infarction. Generalized cerebral volume loss. Nonspecific mild subcortical and periventricular white matter hypodensity, most in keeping with chronic small vessel ischemic change. No ventriculomegaly. Vascular: No acute abnormality. Skull: No evidence of calvarial fracture. Sinuses/Orbits: The visualized paranasal sinuses are essentially clear. Other:  The mastoid air cells are unopacified. IMPRESSION: 1. No evidence of acute intracranial abnormality. No evidence of calvarial fracture. 2. Generalized  cerebral volume loss and mild chronic small vessel ischemic changes in the cerebral white matter. Electronically Signed   By: Ilona Sorrel M.D.   On: 08/06/2017 00:29   Mr Brain Wo Contrast  Result Date:  08/06/2017 CLINICAL DATA:  Difficulty walking.  Fall. EXAM: MRI HEAD WITHOUT CONTRAST TECHNIQUE: Multiplanar, multiecho pulse sequences of the brain and surrounding structures were obtained without intravenous contrast. COMPARISON:  Head CT 08/05/2017 FINDINGS: Brain: The midline structures are normal. There is no acute infarct or acute hemorrhage. No mass lesion, hydrocephalus, dural abnormality or extra-axial collection. Early confluent hyperintense T2-weighted signal of the periventricular and deep white matter, most commonly due to chronic ischemic microangiopathy. No age-advanced or lobar predominant atrophy. No chronic microhemorrhage or superficial siderosis. Vascular: Major intracranial arterial and venous sinus flow voids are preserved. Skull and upper cervical spine: The visualized skull base, calvarium, upper cervical spine and extracranial soft tissues are normal. Sinuses/Orbits: No fluid levels or advanced mucosal thickening. No mastoid or middle ear effusion. Normal orbits. 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 Electronically Signed   By: Ulyses Jarred M.D.   On: 08/06/2017 02:55   Mr Thoracic Spine Wo Contrast  Result Date: 08/15/2017 CLINICAL DATA:  Initial evaluation for midthoracic spine pain, frequent falls. EXAM: MRI THORACIC SPINE WITHOUT CONTRAST TECHNIQUE: Multiplanar, multisequence MR imaging of the thoracic spine was performed. No intravenous contrast was administered. COMPARISON:  None. FINDINGS: Alignment: Dextroscoliosis with straightening of the midthoracic kyphosis. No listhesis. Vertebrae: Vertebral body heights are maintained without evidence for acute or chronic fracture. Few scattered chronic endplate Schmorl's nodes noted. Bone  marrow signal intensity within normal limits. Benign hemangioma noted within the T12 vertebral body. No other discrete or worrisome osseous lesions. No abnormal marrow edema. Cord: Signal intensity within the thoracic spinal cord is normal. Normal cord morphology and caliber. Conus medullaris terminates at approximately the L1 level. Paraspinal and other soft tissues: Paraspinous soft tissues demonstrate no acute abnormality. Visualized visceral structures within normal limits. 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 Disc levels: T1-2:  Unremarkable. T2-3: Unremarkable. T3-4:  Mild disc bulge.  No stenosis. T4-5:  Unremarkable. T5-6:  Unremarkable. T6-7:  Unremarkable. T7-8: Small central disc protrusion indents the ventral thecal sac. No significant cord deformity or stenosis. T8-9: Intervertebral disc space narrowing with mild diffuse disc bulge and reactive endplate changes. Mild facet hypertrophy. No stenosis. T9-10: Mild facet hypertrophy.  No stenosis. T10-11: Shallow right paracentral disc protrusion indents the right ventral thecal sac. No significant stenosis or cord deformity. T11-12:  Unremarkable. T12-L1:  Unremarkable. IMPRESSION: 1. No acute abnormality within the thoracic spine. Normal MRI appearance of the thoracic spinal cord. 2. Mild multilevel degenerative spondylolysis as above without significant spinal stenosis. Most notable findings include small disc protrusions at T7-8 and T10-11. 3. Dextroscoliosis with straightening of the normal thoracic kyphosis. Electronically Signed   By: Jeannine Boga M.D.   On: 08/15/2017 22:43   Mr Lumbar Spine W Wo Contrast  Result Date: 08/06/2017 CLINICAL DATA:  Difficulty walking.  Muscle weakness. EXAM: MRI LUMBAR SPINE WITHOUT AND WITH CONTRAST TECHNIQUE: Multiplanar and multiecho pulse sequences of the lumbar spine were obtained without and with intravenous contrast. CONTRAST:  17m MULTIHANCE GADOBENATE DIMEGLUMINE 529 MG/ML IV SOLN COMPARISON:  Lumbar  spine MRI 06/21/2013 FINDINGS: Segmentation: Rudimentary L5-S1 disc space. Numbering scheme is preserved from the MRI of 06/21/2013. Alignment:  Grade 1 retrolisthesis at L2-L3. Vertebrae:  No fracture, evidence of discitis, or bone lesion. Conus medullaris and cauda equina: Conus extends to the L1 level. Conus and cauda equina appear normal. Paraspinal and other soft tissues: 4 cm left renal cyst. Disc levels: T10-T11: Imaged only in the sagittal plane.  Normal. T11-T12: Normal. T12-L1: Normal disc space and  facets. No spinal canal or neuroforaminal stenosis. L1-L2: Small left subarticular disc protrusion.  No stenosis. L2-L3: Disc space narrowing with mild diffuse bulge. Mild narrowing of the right lateral recess. Moderate right neural foraminal stenosis due to combination of disc and endplate osteophyte. Findings at this level are unchanged. L3-L4: Disc bulge without spinal canal stenosis. Moderate left neural foraminal stenosis is unchanged. L4-L5: Mild disc bulge. No spinal canal stenosis. Mild left foraminal stenosis, unchanged. L5-S1: Normal disc space and facets. No spinal canal or neuroforaminal stenosis. Visualized sacrum: Normal. IMPRESSION: 1. No acute abnormality of the lumbar spine. 2. 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 3. No spinal canal stenosis. Electronically Signed   By: Ulyses Jarred M.D.   On: 08/06/2017 03:36   Dg Chest Port 1 View  Result Date: 08/14/2017 CLINICAL DATA:  Fever EXAM: PORTABLE CHEST 1 VIEW COMPARISON:  PA and lateral chest x-ray of August 13, 2017 FINDINGS: There is new hazy increased density at the left lung base. There is partial obscuration of the left hemidiaphragm. There is a small left pleural effusion. The right lung is clear. The heart is top-normal to mildly enlarged. The pulmonary vascularity is normal. There is calcification in the wall of the aortic arch. The bony  thorax exhibits no acute abnormality. IMPRESSION: Left lower lobe pneumonia and small left pleural effusion. Followup PA and lateral chest X-ray is recommended in 3-4 weeks following trial of antibiotic therapy to ensure resolution and exclude underlying malignancy. Thoracic aortic atherosclerosis. Electronically Signed   By: David  Martinique M.D.   On: 08/14/2017 13:15    Labs:  Basic Metabolic Panel: Recent Labs  Lab 08/21/17 0628 08/23/17 0621  NA 135 134*  K 3.4* 4.0  CL 106 105  CO2 20* 22  GLUCOSE 91 100*  BUN 5* <5*  CREATININE 0.82 0.79  CALCIUM 8.5* 8.9    CBC: Recent Labs  Lab 08/23/17 0621  WBC 7.4  NEUTROABS 3.6  HGB 12.2*  HCT 35.7*  MCV 95.2  PLT 246    CBG: Recent Labs  Lab 08/24/17 2124 08/25/17 0640 08/25/17 1140 08/25/17 1643 08/25/17 2118  GLUCAP 174* 115* 98 126* 128*    Brief HPI:   ZAYIN VALADEZ is a 77 year old male with history of T2DM, PAF, RA, lumbar spondylosis with radiculopathy and recent ESI as well as Knee injection who was admitted on 08/12/17 with worsening of cognitive status as well as difficulty walking with falls. He was noted to be somnolent with significant cognitive deficits and hyponatremic with Na-126. Neurology felt that metabolic encephalopathy due to acute hyponatremia and possible [facility] Course: SEITH AIKEY was admitted to rehab 08/18/2017 for inpatient therapies to consist of PT, ST and OT at least three hours five days a week. Past admission physiatrist, therapy team and rehab RN have worked together to provide customized collaborative inpatient rehab. He completed one week course of Zosyn on 04/01 and respiratory status is stable. Hyponatremia has resolved with sodium up to 134. Check of lytes showed hypokalemia therefore K dur was added for supplement with resolution. CBC showed H/H and platelets are stable on Eliquis.  Blood pressures has been stable and heart rate is controlled on Flecainide and  metoprolol.   He continues to have issues with confusion in the evenings but no agitation noted. Family reported that mentation and mobility has has greatly improved compared to baseline.  He denied any  issues with knee or back pain during admission. Po intake has been good and BS were starting to trend upwards. Metformin was resumed at 500 mg with breakfast at discharge and family was advised to monitor BS bid-tid basis. He has been educated on alcohol cessation as well as no driving. He has made steady progress and is currently at supervision to min assist level for safety. He will continue to receive follow up HHPT, [place] and HHST by [facility] after discharge.    Rehab course: During patient's stay in rehab weekly team conferences were held to monitor patient's progress, set goals and discuss barriers to discharge. At admission, patient required min assist with mobility and mod to max assist with ADL tasks. Cognitive exam revealed moderate to severe cognitive deficits requiring max cues for attention, basic problem solving and memory.  He  has had improvement in activity tolerance, balance, postural control as well as ability to compensate for deficits.  He requires cues with supervision to complete ADL tasks.  He is able to transfer with supervision and is ambulating 67' with RW and cues for safety.  He requires min assist with verbal cues for basic problem solving, recall of new information and emergent awareness. Family education was completed regarding all aspects of care.    Disposition:  01-Home or Self Care  Diet: Heart Healthy/Carb Modified.   Special Instructions: 1. Needs 24 hours supervision and assistance with medication management.    Discharge Instructions    Ambulatory referral to Neurology   Complete by:  As directed    An appointment is requested in approximately: 1-2 weeks. Talk to [phone]   Ambulatory referral to Physical Medicine Rehab   Complete by:  As  directed    1-2 weeks transitional care appt     Allergies as of 08/26/2017      Reactions   Hydrocodone Nausea Only      Medication List    STOP taking these medications   amoxicillin-clavulanate 875-125 MG tablet Commonly known as:  AUGMENTIN     TAKE these medications   acetaminophen 325 MG tablet Commonly known as:  TYLENOL Take 1-2 tablets (325-650 mg total) by mouth every 4 (four) hours as needed for mild pain. What changed:    how much to take  when to take this  reasons to take this   cyanocobalamin 1000 MCG tablet Take 1 tablet (1,000 mcg total) by mouth daily.   diclofenac sodium 1 % Gel Commonly known as:  VOLTAREN Apply topically 4 (four) times daily.   ELIQUIS 5 MG Tabs tablet Generic drug:  apixaban Take 5 mg by mouth 2 (two) times daily.   esomeprazole 40 MG capsule Commonly known as:  NEXIUM Take 40 mg by mouth daily after supper.   flecainide 50 MG tablet Commonly known as:  TAMBOCOR Take 1 tablet (50 mg total) by mouth every 12 (twelve) hours.   folic acid 1 MG tablet Commonly known as:  FOLVITE Take 1 tablet (1 mg total) by mouth daily.   levothyroxine 75 MCG tablet Commonly known as:  SYNTHROID, LEVOTHROID Take 75 mcg by mouth daily before breakfast.   metFORMIN 500 MG 24 hr tablet Commonly known as:  GLUCOPHAGE-XR Take 1 tablet (500 mg total) by mouth daily with breakfast. What changed:  when to take this   metoprolol tartrate 25 MG tablet Commonly known as:  LOPRESSOR Take 1 tablet (25 mg total) by mouth 2 (two) times daily.   potassium chloride SA 20  MEQ tablet Commonly known as:  K-DUR,KLOR-CON Take 1 tablet (20 mEq total) by mouth daily.   rosuvastatin 10 MG tablet Commonly known as:  CRESTOR Take 1 tablet (10 mg total) by mouth daily.   saccharomyces boulardii 250 MG capsule Commonly known as:  FLORASTOR Take 1 capsule (250 mg total) by mouth 2 (two) times daily. What changed:  additional instructions   thiamine 100  MG tablet Take 1 tablet (100 mg total) by mouth daily.   traZODone 50 MG tablet Commonly known as:  DESYREL Take 1 tablet (50 mg total) by mouth at bedtime as needed for sleep.      Follow-up Information    Meredith Staggers, MD Follow up.   Specialty:  Physical Medicine and Rehabilitation Why:  Office will call you with follow up appointment Contact information: [place] [address] [phone]        [facility]. Call.   Why:  for follow up appointment Contact information: [address]     [address] [place] [address] [phone]       Merrilee Seashore, MD Follow up on 09/13/2017.   Specialty:  Internal Medicine Why:  @ 2:30 pm (hospital follow up appt).  Needs BMET rechecked.  Contact information: [address] [place] [address] [phone]        Adrian Prows, MD. Call in 1 day(s).   Specialty:  Cardiology Why:  for follow up appointment Contact information: [address] [place] [address] [phone]           Signed: Bary Leriche 08/26/2017, 9:16 AM

## 2017-08-25 NOTE — Discharge Summary (Signed)
Occupational Therapy Discharge Summary  Patient Details  Name: [name] MRN: [id number] Date of Birth: 17-Sep-1940  Today's Date: 08/25/2017 OT Individual Time: 9735-3299 OT Individual Time Calculation (min): 58 min   Patient has met 10 of 12 long term goals due to improved activity tolerance, improved balance, postural control, ability to compensate for deficits, improved awareness and improved coordination.  Patient to discharge at overall Supervision level.  Patient's care partner has attended OT family education is independent to provide the necessary physical and cognitive assistance at discharge.    Reasons goals not met: Pt refused to complete bathing at time of discharge and has not demonstrated ability to complete at supervision level; he also still requires cuing for ADL tasks with dynamic standing demands.    Recommendation:  Patient will benefit from ongoing skilled OT services in home health setting to continue to advance functional skills in the area of BADL and iADL.  Equipment: BSC + TTB  Reasons for discharge: treatment goals met and discharge from hospital  Patient/family agrees with progress made and goals achieved: Yes   Skilled Therapeutic Intervention:  Pt greeted in w/c with spouse present for family education. Pt vehemently refused to complete bathing/dressing. Worked on toilet and TTB transfers, with pt transferring with RW and supervision at ambulatory level. Spouse with hands on practice throughout session, including during ambulation from therapy tub room back to his room. Education emphasis placed on guarding pt from behind pt due to posterior LOBs, and also walker safety. Solidified DME needs with information relayed to SW. At end of session pt returned to bed and was left with spouse. Signed her off as trained caregiver for toilet transfers with RN made aware.   OT Discharge Precautions/Restrictions  Precautions Precautions:  Fall Restrictions Weight Bearing Restrictions: No Pain Pain Assessment Pain Scale: 0-10 Pain Score: 0-No pain ADL ADL ADL Comments: Please see functional navigator for ADL status Vision Baseline Vision/History: Wears glasses Wears Glasses: At all times Patient Visual Report: No change from baseline Cognition Overall Cognitive Status: Impaired/Different from baseline Arousal/Alertness: Awake/alert Orientation Level: Oriented X4 Selective Attention: Impaired Memory: Impaired Awareness: Impaired Problem Solving: Impaired Safety/Judgment: Impaired Sensation Sensation Light Touch: Appears Intact Proprioception: Appears Intact Coordination Gross Motor Movements are Fluid and Coordinated: Yes Fine Motor Movements are Fluid and Coordinated: Yes Motor  Motor Motor: Other (comment);Within Functional Limits Motor - Discharge Observations: generalized weakness Mobility  Transfers Transfers: Sit to Stand;Stand to Sit Sit to Stand: 5: Supervision;From toilet Sit to Stand Details: Verbal cues for precautions/safety Stand to Sit: 5: Supervision;To toilet Stand to Sit Details (indicate cue type and reason): Verbal cues for precautions/safety  Trunk/Postural Assessment  Cervical Assessment Cervical Assessment: Within Functional Limits Thoracic Assessment Thoracic Assessment: Within Functional Limits Lumbar Assessment Lumbar Assessment: Within Functional Limits Postural Control Postural Control: Deficits on evaluation Righting Reactions: delayed  Balance Balance Balance Assessed: Yes Dynamic Sitting Balance Sitting balance - Comments: (Supervision, while doffing shoes EOB) Dynamic Standing Balance Dynamic Standing - Balance Support: Bilateral upper extremity supported(functional transfers to TTB and toilet using RW) Dynamic Standing - Level of Assistance: 5: Stand by assistance Extremity/Trunk Assessment RUE Assessment RUE Assessment: Within Functional Limits LUE  Assessment LUE Assessment: Within Functional Limits   See Function Navigator for Current Functional Status.  [name] [name] 08/25/2017, 12:53 PM

## 2017-08-25 NOTE — Plan of Care (Signed)
  Problem: Consults Goal: RH BRAIN INJURY PATIENT EDUCATION Description Description: See Patient Education module for eduction specifics Outcome: Completed/Met   Problem: RH SKIN INTEGRITY Goal: RH STG SKIN FREE OF INFECTION/BREAKDOWN Description No new breakdown with supervision assist   Outcome: Completed/Met   Problem: RH SAFETY Goal: RH STG ADHERE TO SAFETY PRECAUTIONS W/ASSISTANCE/DEVICE Description STG Adhere to Safety Precautions With Supervision Assistance/Device.  Outcome: Completed/Met   Problem: RH COGNITION-NURSING Goal: RH STG ANTICIPATES NEEDS/CALLS FOR ASSIST W/ASSIST/CUES Description STG Anticipates Needs/Calls for Assist With Mod I Assistance/Cues.  Outcome: Completed/Met

## 2017-08-26 LAB — GLUCOSE, CAPILLARY: Glucose-Capillary: 97 mg/dL (ref 65–99)

## 2017-08-26 NOTE — Progress Notes (Signed)
Complains of neuropathic pain to right foot. Reports getting worse. Made Dr. Inda Merlin aware. Incontinent of large void this AM. [name] A

## 2017-08-26 NOTE — Progress Notes (Signed)
Patient ID: [name], male   DOB: [birth year], 77 y.o.   MRN: [id number]   [name] is a 77 y.o. male  77 year old patient who was admitted with encephalopathy.  Medical issues include type 2 diabetes with peripheral neuropathy.  History of paroxysmal atrial fibrillation.  He has some significant cognitive deficits in the setting of hyponatremia.  He was felt to have a metabolic encephalopathy secondary to the electrolyte abnormalities.  Subjective: Comfortable night.  Feels that he is ready for discharge as scheduled today  Objective: Vital signs in last 24 hours: Temp:  [97.8 F (36.6 C)-98 F (36.7 C)] 97.8 F (36.6 C) (04/06 0249) Pulse Rate:  [56-60] 56 (04/06 0249) Resp:  [14-18] 18 (04/06 0249) BP: (128-142)/(71-79) 128/79 (04/06 0249) SpO2:  [100 %] 100 % (04/06 0249) Weight change:  Last BM Date: 08/24/17  Intake/Output from previous day: 04/05 0701 - 04/06 0700 In: 480 [P.O.:480] Out: -  Last cbgs: CBG (last 3)  Recent Labs    08/25/17 1140 08/25/17 1643 08/25/17 2118  GLUCAP 98 126* 128*     Physical Exam General: No apparent distress   HEENT: not dry Lungs: Normal effort. Lungs clear to auscultation, no crackles or wheezes. Cardiovascular: Regular rate and rhythm, no edema Abdomen: S/NT/ND; BS(+) Musculoskeletal:  unchanged Neurological: No new neurological deficits Wounds: N/A    Skin: clear   Mental state: Alert, oriented, cooperative    Lab Results: BMET    Component Value Date/Time   NA 134 (L) 08/23/2017 0621   K 4.0 08/23/2017 0621   CL 105 08/23/2017 0621   CO2 22 08/23/2017 0621   GLUCOSE 100 (H) 08/23/2017 0621   BUN <5 (L) 08/23/2017 0621   CREATININE 0.79 08/23/2017 0621   CALCIUM 8.9 08/23/2017 0621   GFRNONAA >60 08/23/2017 0621   GFRAA >60 08/23/2017 0621   CBC    Component Value Date/Time   WBC 7.4 08/23/2017 0621   RBC 3.75 (L) 08/23/2017 0621   HGB 12.2 (L) 08/23/2017 0621   HCT 35.7 (L)  08/23/2017 0621   HCT 35.5 (L) 08/13/2017 0859   PLT 246 08/23/2017 0621   MCV 95.2 08/23/2017 0621   MCH 32.5 08/23/2017 0621   MCHC 34.2 08/23/2017 0621   RDW 14.6 08/23/2017 0621   LYMPHSABS 2.7 08/23/2017 0621   MONOABS 0.8 08/23/2017 0621   EOSABS 0.2 08/23/2017 0621   BASOSABS 0.1 08/23/2017 0621    Studies/Results: No results found.  Medications: I have reviewed the patient's current medications.  Assessment/Plan:  Status post metabolic encephalopathy Diabetes mellitus type 2  Stable for discharge. Outpatient follow-up with neurology and amatory referral for additional physical therapy   Length of stay, days: [place] , MD 08/26/2017, 7:30 AM

## 2017-08-29 ENCOUNTER — Telehealth: Payer: Self-pay | Admitting: Registered Nurse

## 2017-08-29 NOTE — Telephone Encounter (Signed)
Transitional Care call Transitional Care Call Completed, Appointment Confirmed, Address Confirmed, New Patient Packet Mailed Transitional Care Call Questions answered by Mrs. Speckman  Patient name: [name]  DOB: 09/26/1940 1. Are you/is patient experiencing any problems since coming home? No a. Are there any questions regarding any aspect of care? No 2. Are there any questions regarding medications administration/dosing? No a. Are meds being taken as prescribed? Yes b. "Patient should review meds with caller to confirm"  Medication List Reviewed 3. Have there been any falls? No 4. Has Home Health been to the house and/or have they contacted you? Yes, [facility] a. If not, have you tried to contact them? NA b. Can we help you contact them? NA 5. Are bowels and bladder emptying properly? Yes a. Are there any unexpected incontinence issues? No b. If applicable, is patient following bowel/bladder programs? NA 6. Any fevers, problems with breathing, unexpected pain? No 7. Are there any skin problems or new areas of breakdown? No 8. Has the patient/family member arranged specialty MD follow up (ie cardiology/neurology/renal/surgical/etc.)?  All appointments are scheduled. a. Can we help arrange? NA 9. Does the patient need any other services or support that we can help arrange? No 10. Are caregivers following through as expected in assisting the patient? Yes 11. Has the patient quit smoking, drinking alcohol, or using drugs as recommended? Mrs. Bearce reports Mr. Brumbach doesn't smoke, drink alcohol or use illicit drugs.   Appointment date/time 09/06/2017 arrival time 12:40 for 1:00 appointment with Danella Sensing ANP-C.  At       [place]

## 2017-08-29 NOTE — Telephone Encounter (Signed)
Place a Transitional Care Call, left a voicemail. Awaiting a return call.

## 2017-09-06 ENCOUNTER — Encounter: Payer: Self-pay | Admitting: Registered Nurse

## 2017-09-06 ENCOUNTER — Encounter: Payer: Medicare Other | Attending: Registered Nurse | Admitting: Registered Nurse

## 2017-09-06 VITALS — BP 109/72 | HR 70 | Ht 67.0 in | Wt 148.0 lb

## 2017-09-06 DIAGNOSIS — R269 Unspecified abnormalities of gait and mobility: Secondary | ICD-10-CM | POA: Diagnosis not present

## 2017-09-06 DIAGNOSIS — E871 Hypo-osmolality and hyponatremia: Secondary | ICD-10-CM

## 2017-09-06 DIAGNOSIS — Z7901 Long term (current) use of anticoagulants: Secondary | ICD-10-CM | POA: Diagnosis not present

## 2017-09-06 DIAGNOSIS — M069 Rheumatoid arthritis, unspecified: Secondary | ICD-10-CM | POA: Insufficient documentation

## 2017-09-06 DIAGNOSIS — Z09 Encounter for follow-up examination after completed treatment for conditions other than malignant neoplasm: Secondary | ICD-10-CM | POA: Diagnosis present

## 2017-09-06 DIAGNOSIS — E785 Hyperlipidemia, unspecified: Secondary | ICD-10-CM | POA: Diagnosis not present

## 2017-09-06 DIAGNOSIS — K219 Gastro-esophageal reflux disease without esophagitis: Secondary | ICD-10-CM | POA: Insufficient documentation

## 2017-09-06 DIAGNOSIS — G9340 Encephalopathy, unspecified: Secondary | ICD-10-CM

## 2017-09-06 DIAGNOSIS — Z87891 Personal history of nicotine dependence: Secondary | ICD-10-CM | POA: Diagnosis not present

## 2017-09-06 DIAGNOSIS — E079 Disorder of thyroid, unspecified: Secondary | ICD-10-CM | POA: Insufficient documentation

## 2017-09-06 DIAGNOSIS — E119 Type 2 diabetes mellitus without complications: Secondary | ICD-10-CM | POA: Diagnosis not present

## 2017-09-06 DIAGNOSIS — M8580 Other specified disorders of bone density and structure, unspecified site: Secondary | ICD-10-CM | POA: Insufficient documentation

## 2017-09-06 DIAGNOSIS — I10 Essential (primary) hypertension: Secondary | ICD-10-CM | POA: Insufficient documentation

## 2017-09-06 DIAGNOSIS — I480 Paroxysmal atrial fibrillation: Secondary | ICD-10-CM

## 2017-09-06 NOTE — Progress Notes (Signed)
Subjective:    Patient ID: [name], male    DOB: May 28, 1940, 77 y.o.   MRN: [id number]  HPI: [name] is a 77 year old male who is here for transitional care visit in follow up of his Encephalopathy, hyponatremia, PAF, Type 2 DM without insulin and Gait Disorder. He has been home with home health therapies from [facility], receiving Physical, Occupational and Speech Therapy.  Also reports he has a poor appetite, he states he's eating two meals a day, encouraged small meals through out the day.  He denies pain.   Wife in room all questions answered.   Pain Inventory Average Pain 8 Pain Right Now 0 My pain is sharp  In the last 24 hours, has pain interfered with the following? General activity 8 Relation with others 7 Enjoyment of life 7 What TIME of day is your pain at its worst? morning Sleep (in general) .  Pain is worse with: . Pain improves with: injections Relief from Meds: 8  Mobility use a walker  Function retired  Neuro/Psych weakness trouble walking confusion  Prior Studies Any changes since last visit?  no  Physicians involved in your care Any changes since last visit?  no   Family History  Problem Relation Age of Onset  . Diabetes Mellitus I Father   . Diabetes Mellitus I Mother   . CAD Brother    Social History   Socioeconomic History  . Marital status: Married    Spouse name: Not on file  . Number of children: Not on file  . Years of education: Not on file  . Highest education level: Not on file  Occupational History  . Not on file  Social Needs  . Financial resource strain: Not on file  . Food insecurity:    Worry: Not on file    Inability: Not on file  . Transportation needs:    Medical: Not on file    Non-medical: Not on file  Tobacco Use  . Smoking status: Former Smoker    Types: Cigarettes  . Smokeless tobacco: Never Used  Substance and Sexual Activity  . Alcohol use: Yes    Alcohol/week:  0.0 oz    Comment: "once in a while"  . Drug use: No  . Sexual activity: Not on file  Lifestyle  . Physical activity:    Days per week: Not on file    Minutes per session: Not on file  . Stress: Not on file  Relationships  . Social connections:    Talks on phone: Not on file    Gets together: Not on file    Attends religious service: Not on file    Active member of club or organization: Not on file    Attends meetings of clubs or organizations: Not on file    Relationship status: Not on file  Other Topics Concern  . Not on file  Social History Narrative  . Not on file   Past Surgical History:  Procedure Laterality Date  . COLONOSCOPY     Past Medical History:  Diagnosis Date  . DM (diabetes mellitus) ([place])   . GERD (gastroesophageal reflux disease)   . Hyperlipidemia   . Hypertension   . 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 Osteoarthritis   . Osteopenia   . Rheumatoid arthritis ([place])   . Thyroid disease    BP 109/72   Pulse 70   Ht 5' 7"  (1.702 m)   Wt 148  lb (67.1 kg)   SpO2 95%   BMI 23.18 kg/m   Opioid Risk Score:   Fall Risk Score:  `1  Depression screen PHQ 2/9  No flowsheet data found.   Review of Systems  Constitutional: Negative.   HENT: Negative.   Eyes: Negative.   Respiratory: Negative.   Cardiovascular: Negative.   Gastrointestinal: Negative.   Endocrine: Negative.   Genitourinary: Negative.   Musculoskeletal: Positive for gait problem.  Skin: Negative.   Allergic/Immunologic: Negative.   Neurological: Positive for weakness.  Hematological: Negative.   Psychiatric/Behavioral: Positive for confusion.  All other systems reviewed and are negative.      Objective:   Physical Exam  Constitutional: He is oriented to person, place, and time. He appears well-developed and well-nourished.  HENT:  Head: Normocephalic and atraumatic.  Neck: Normal range of motion. Neck supple.  Cardiovascular: Normal rate and regular rhythm.  Pulmonary/Chest:  Effort normal and breath sounds normal.  Musculoskeletal:  Normal Muscle Bulk and Muscle Testing Reveals: Upper Extremities: Full ROM and Muscle Strength 5/5 Lower Extremities: Full ROM and Muscle Strength 5/5 Arises from Table with ease, use walker for support Gait is steady with normal steps. Tandem gait is normal   Neurological: He is alert and oriented to person, place, and time.  Skin: Skin is warm and dry.  Psychiatric: He has a normal mood and affect.  Nursing note and vitals reviewed.         Assessment & Plan:  1. Encephalopathy/ Hyponatremia: Resolved: PCP Following 2. 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 PAF: Continue Eliquis: Cardiology Following 4. Type 2 DM without Insulin: Continue Metformin: PCP Following.  30 minutes of face to face patient care time was spent during this visit. All questions were encouraged and answered.

## 2017-09-20 ENCOUNTER — Other Ambulatory Visit: Payer: Self-pay | Admitting: Physical Medicine and Rehabilitation

## 2017-09-26 ENCOUNTER — Other Ambulatory Visit: Payer: Self-pay | Admitting: Physical Medicine and Rehabilitation

## 2017-10-03 ENCOUNTER — Encounter: Payer: Medicare Other | Attending: Registered Nurse | Admitting: Physical Medicine & Rehabilitation

## 2017-10-03 ENCOUNTER — Other Ambulatory Visit: Payer: Self-pay

## 2017-10-03 ENCOUNTER — Encounter: Payer: Self-pay | Admitting: Physical Medicine & Rehabilitation

## 2017-10-03 VITALS — BP 133/78 | HR 56 | Ht 66.5 in | Wt 153.8 lb

## 2017-10-03 DIAGNOSIS — Z09 Encounter for follow-up examination after completed treatment for conditions other than malignant neoplasm: Secondary | ICD-10-CM | POA: Diagnosis present

## 2017-10-03 DIAGNOSIS — K219 Gastro-esophageal reflux disease without esophagitis: Secondary | ICD-10-CM | POA: Insufficient documentation

## 2017-10-03 DIAGNOSIS — R269 Unspecified abnormalities of gait and mobility: Secondary | ICD-10-CM | POA: Insufficient documentation

## 2017-10-03 DIAGNOSIS — E119 Type 2 diabetes mellitus without complications: Secondary | ICD-10-CM | POA: Diagnosis not present

## 2017-10-03 DIAGNOSIS — I10 Essential (primary) hypertension: Secondary | ICD-10-CM | POA: Insufficient documentation

## 2017-10-03 DIAGNOSIS — M8580 Other specified disorders of bone density and structure, unspecified site: Secondary | ICD-10-CM | POA: Diagnosis not present

## 2017-10-03 DIAGNOSIS — G9340 Encephalopathy, unspecified: Secondary | ICD-10-CM

## 2017-10-03 DIAGNOSIS — M069 Rheumatoid arthritis, unspecified: Secondary | ICD-10-CM | POA: Insufficient documentation

## 2017-10-03 DIAGNOSIS — Z7901 Long term (current) use of anticoagulants: Secondary | ICD-10-CM | POA: Diagnosis not present

## 2017-10-03 DIAGNOSIS — I480 Paroxysmal atrial fibrillation: Secondary | ICD-10-CM | POA: Diagnosis not present

## 2017-10-03 DIAGNOSIS — E079 Disorder of thyroid, unspecified: Secondary | ICD-10-CM | POA: Insufficient documentation

## 2017-10-03 DIAGNOSIS — R2689 Other abnormalities of gait and mobility: Secondary | ICD-10-CM | POA: Diagnosis not present

## 2017-10-03 DIAGNOSIS — E871 Hypo-osmolality and hyponatremia: Secondary | ICD-10-CM | POA: Diagnosis not present

## 2017-10-03 DIAGNOSIS — E785 Hyperlipidemia, unspecified: Secondary | ICD-10-CM | POA: Diagnosis not present

## 2017-10-03 DIAGNOSIS — Z87891 Personal history of nicotine dependence: Secondary | ICD-10-CM | POA: Diagnosis not present

## 2017-10-03 NOTE — Patient Instructions (Signed)
PLEASE FEEL FREE TO CALL OUR OFFICE WITH ANY PROBLEMS OR QUESTIONS ([phone])      

## 2017-10-03 NOTE — Progress Notes (Signed)
Subjective:    Patient ID: [name], male    DOB: [birth year], 77 y.o.   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 He has completed HH therapies and has been referred to outpt therapies at [facility]. He is walking with a walker still, most often with his rolling walker.  He denies any falls or near misses at home.  He still has some short term memory deficits per his wife.  He has improved cognitively but is not back to baseline per her.   He is sleeping well at night. He is moving his bowels and bladder. He denies being depressed, but his PCP apparently started him on Celexa.  They have not filled the prescription as of yet.  Patient seems to be eating well also periods sometimes motivation is an issue for him but he has been participating daily and exercise at home and did well with therapies.   Pain Inventory Average Pain 5 Pain Right Now 0 My pain is dull  In the last 24 hours, has pain interfered with the following? General activity 0 Relation with others 0 Enjoyment of life 0 What TIME of day is your pain at its worst? night Sleep (in general) Good  Pain is worse with: unsure Pain improves with: rest Relief from Meds: 0  Mobility use a walker ability to climb steps?  yes do you drive?  no use a wheelchair  Function retired Do you have any goals in this area?  no  Neuro/Psych trouble walking  Prior Studies Any changes since last visit?  no  Physicians involved in your care Any changes since last visit?  no   Family History  Problem Relation Age of Onset  . Diabetes Mellitus I Father   . Diabetes Mellitus I Mother   . CAD Brother    Social History   Socioeconomic History  . Marital status: Married    Spouse name: Not on file  . Number of children: Not on file  . Years of education: Not on file  . Highest education level: Not on file  Occupational History  . Not on file    Social Needs  . Financial resource strain: Not on file  . Food insecurity:    Worry: Not on file    Inability: Not on file  . Transportation needs:    Medical: Not on file    Non-medical: Not on file  Tobacco Use  . Smoking status: Former Smoker    Types: Cigarettes  . Smokeless tobacco: Never Used  Substance and Sexual Activity  . Alcohol use: Yes    Alcohol/week: 0.0 oz    Comment: "once in a while"  . Drug use: No  . Sexual activity: Not on file  Lifestyle  . Physical activity:    Days per week: Not on file    Minutes per session: Not on file  . Stress: Not on file  Relationships  . Social connections:    Talks on phone: Not on file    Gets together: Not on file    Attends religious service: Not on file    Active member of club or organization: Not on file    Attends meetings of clubs or organizations: Not on file    Relationship status: Not on file  Other Topics Concern  . Not on file  Social History Narrative  . Not on file   Past Surgical History:  Procedure Laterality Date  . COLONOSCOPY     Past Medical History:  Diagnosis Date  . DM (diabetes mellitus) ([place])   . GERD (gastroesophageal reflux disease)   . Hyperlipidemia   . Hypertension   . 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 Osteoarthritis   . Osteopenia   . Rheumatoid arthritis ([place])   . Thyroid disease    BP 133/78   Pulse (!) 56   Ht 5' 6.5" (1.689 m) Comment: pt reported  Wt 153 lb 12.8 oz (69.8 kg)   SpO2 98%   BMI 24.45 kg/m   Opioid Risk Score:   Fall Risk Score:  `1  Depression screen PHQ 2/9  Depression screen PHQ 2/9 10/03/2017  Decreased Interest 0  Down, Depressed, Hopeless 0  PHQ - 2 Score 0    Review of Systems  Constitutional: Negative.   HENT: Negative.   Eyes: Negative.   Respiratory: Negative.   Cardiovascular: Negative.   Gastrointestinal: Negative.   Endocrine: Negative.   Genitourinary: Negative.   Musculoskeletal: Negative.   Skin: Negative.    Allergic/Immunologic: Negative.   Neurological: Negative.   Hematological: Negative.   Psychiatric/Behavioral: Negative.   All other systems reviewed and are negative.      Objective:   Physical Exam  General: Alert and oriented x 3, No apparent distress HEENT: Head is normocephalic, atraumatic, PERRLA, EOMI, sclera anicteric, oral mucosa pink and moist, dentition intact, ext ear canals clear,  Neck: Supple without JVD or lymphadenopathy Heart: Reg rate and rhythm. No murmurs rubs or gallops Chest: CTA bilaterally without wheezes, rales, or rhonchi; no distress Abdomen: Soft, non-tender, non-distended, bowel sounds positive. Extremities: No clubbing, cyanosis, or edema. Pulses are 2+ Skin: Clean and intact without signs of breakdown Neuro: Patient has reasonable insight and awareness.  Does have some short-term memory deficits and occasional processing delays with more complex information.. Cranial nerves 2-12 are intact. Sensory exam is normal except for decreased light touch in the feet.  Reflexes are 2+ in all 4's. Fine motor coordination is intact. No tremors. Motor function is grossly 5/5.  Ambulate with his walker and did fairly well today.  We took the walker away he tends to drop his left hip substantially during stance and leans backwards sometimes walking on his heels.  Occasionally his legs will scissor as well. Musculoskeletal: Full ROM, No pain with AROM or PROM in the neck, trunk, or extremities. Posture appropriate Psych: Pt's affect is appropriate. Pt is cooperative         Assessment & Plan:  1. Encephalopathy/ Hyponatremia: Generally has resolved.  However patient still with short-term memory deficits in processing delays.  I suspect some of this is related to his small vessel disease and cerebral atrophy.  Made a referral to outpatient speech therapy at [facility] neuro rehab. 2. 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  He has improved although I am  not comfortable with him walking without his walker.  Made referral to outpatient PT at [facility] neuro rehab to work on balance, core muscle and lower extremity strength and gait mechanics.  He may be able to progress to a cane only for balance. 3. PAF: Continue Eliquis: Cardiology Following 4. Type 2 DM without Insulin: Continue Metformin: PCP Following.  Appears to be under better control. 5.  Questionable depression: I see no vegetative signs on examination or by history that would indicate depression.  He may lack some motivation occasionally at times which could be cognitively based.  Will  be my recommendation not to start in any depression at this point and observe closely for now.  Both he and his wife are in agreement.  He is having his thyroid hormones followed by his primary.  30 minutes of face to face patient care time was spent during this visit. All questions were encouraged and answered.   I will see the patient back in about 2 months time.

## 2017-10-09 ENCOUNTER — Other Ambulatory Visit: Payer: Self-pay

## 2017-10-09 ENCOUNTER — Ambulatory Visit: Payer: Medicare Other | Attending: Physical Medicine & Rehabilitation | Admitting: Speech Pathology

## 2017-10-09 DIAGNOSIS — R41841 Cognitive communication deficit: Secondary | ICD-10-CM | POA: Diagnosis not present

## 2017-10-09 DIAGNOSIS — R2689 Other abnormalities of gait and mobility: Secondary | ICD-10-CM | POA: Diagnosis present

## 2017-10-09 DIAGNOSIS — R2681 Unsteadiness on feet: Secondary | ICD-10-CM | POA: Insufficient documentation

## 2017-10-09 DIAGNOSIS — M6281 Muscle weakness (generalized): Secondary | ICD-10-CM | POA: Diagnosis present

## 2017-10-09 NOTE — Therapy (Signed)
[facility] [address] [place], [place], [address] Phone: [phone]   Fax:  [phone]  Speech Language Pathology Evaluation  Patient Details  Name: [name] MRN: [id number] Date of Birth: 1941-04-06 Referring Provider: Dr. Naaman Plummer   Encounter Date: 10/09/2017  End of Session - 10/09/17 1441    Visit Number  1    Number of Visits  5    Date for SLP Re-Evaluation  12/15/17    SLP Start Time  1400    SLP Stop Time   1444    SLP Time Calculation (min)  44 min    Activity Tolerance  Patient tolerated treatment well       Past Medical History:  Diagnosis Date  . DM (diabetes mellitus) ([place])   . GERD (gastroesophageal reflux disease)   . Hyperlipidemia   . Hypertension   . 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 Osteoarthritis   . Osteopenia   . Rheumatoid arthritis ([place])   . Thyroid disease     Past Surgical History:  Procedure Laterality Date  . COLONOSCOPY      There were no vitals filed for this visit.  Subjective Assessment - 10/09/17 1403    Subjective  "Why am I here for speech therapy."    Patient is accompained by:  Family member wife Hilaria Ota    Currently in Pain?  Yes    Pain Score  6     Pain Location  Back    Pain Orientation  Lower    Pain Descriptors / Indicators  Aching;Sharp;Sore    Pain Type  Chronic pain    Pain Onset  More than a month ago    Pain Relieving Factors  steroids help         SLP Evaluation [facility] - 10/09/17 1403      SLP Visit Information   SLP Received On  10/09/17    Referring Provider  Dr. 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 He was discharged to home but continued to worsen with onset of sundowning and was admitted 08/11/17 for work up. Family reported gradual memory decline which has been worse in the past few weeks to months. CT head unremarkable for acute intracranial process. Per report, chronic microangiopathic ischemic changes of periventricular and subcortical white matter with cerebral atrophy. ST in CIR 08/19/17-08/25/17 and HH. Neurology felt subacute changes over past few months may be due to metabolic encephalopathy due to acute hyponatremia, possible ETOH withdrawal vs Wernicke's encephalopathy vs underlying dementia or sensory dementia. 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     Behavioral/Cognition  alert, confused    Mobility Status  uses a cane      Balance Screen   Has the patient fallen in the past 6 months  No PT eval Friday      Prior Functional Status   Cognitive/Linguistic Baseline  Within functional limits  Type of Home  House     Lives With  Spouse    Available Support  Family;Available 24 hours/day    Education  PhD in [employer]  Retired      Cognition   Overall Cognitive Status  Impaired/Different from baseline    Area of Garment/textile technologist;Attention;Memory;Safety/judgement;Awareness;Problem solving    Orientation Level  Disoriented to date    General Comments  oriented to current month, year    Current Attention Level  Sustained    Memory  Decreased short-term memory    Safety/Judgement  Decreased awareness of safety;Decreased awareness of deficits    Safety and Judgement Comments  pt wants to drive    Attention  Sustained;Selective;Alternating    Sustained Attention  Appears intact    Selective Attention   Impaired    Selective Attention Impairment  Verbal basic    Alternating Attention  Impaired    Alternating Attention Impairment  Verbal basic    Memory  Impaired    Memory Impairment  Decreased recall of new information;Decreased short term memory;Storage deficit    Decreased Short Term Memory  Verbal basic;Functional basic delayed recall 2/5, poor recall of conversation after 30 min    Awareness  Impaired    Awareness Impairment  Emergent impairment;Anticipatory impairment    Problem Solving  Impaired    Problem Solving Impairment  Functional basic able to provide 2/3 ways to make $13; poor error awareness    Executive Function  Reasoning;Sequencing;Organizing;Self Monitoring;Self Correcting    Reasoning  Impaired    Reasoning Impairment  Verbal basic abstraction 2/3    Sequencing  Impaired    Sequencing Impairment  Functional basic trailmaking impaired    Organizing  Impaired    Organizing Impairment  Functional basic clockdrawing with poor spacing, incorrect placement of hands    Self Monitoring  Impaired    Self Monitoring Impairment  Functional basic    Self Correcting  Impaired    Self Correcting Impairment  Functional basic mod-max A required      Auditory Comprehension   Overall Auditory Comprehension  Appears within functional limits for tasks assessed      Reading Comprehension   Reading Status  Not tested      Verbal Expression   Overall Verbal Expression  Other (comment) functional in conversation; confrontation naming impaired    Initiation  No impairment    Automatic Speech  Name;Social Response    Level of Generative/Spontaneous Verbalization  Conversation    Naming  Impairment    Confrontation  50-74% accurate 2/4 animals; anomia (peacock) and paraphasia (zeraffe/zebra)    Divergent  25-49% accurate 5 fruits in 60 seconds    Verbal Errors  Semantic paraphasias;Phonemic paraphasias    Pragmatics  Impairment    Impairments  Abnormal affect    Interfering  Components  Attention    Effective Techniques  Open ended questions;Semantic cues;Phonemic cues    Non-Verbal Means of Communication  Not applicable      Written Expression   Dominant Hand  Right    Written Expression  Not tested      Oral Motor/Sensory Function   Overall Oral Motor/Sensory Function  Appears within functional limits for tasks assessed      Motor Speech   Overall Motor Speech  Appears within functional limits for tasks assessed      Standardized Assessments   Standardized Assessments   Montreal Cognitive Assessment (MOCA);Other Assessment    Montreal Cognitive Assessment (MOCA)  17/30    Other Assessment  CLQT administered on CIR; moderate-severe impairments      Individuals Consulted   Consulted and Agree with Results and Recommendations  Patient;Family member/caregiver    Family Member Consulted   pt's wife [place]                          SLP Long Term Goals - 10/09/17 1527      SLP LONG TERM GOAL #1   Title  Pt will utilize external aids for medication, schedule management/functional recall with occasional min A from family outside of therapy over 2 sessions    Time  4    Period  Weeks    Status  New      SLP LONG TERM GOAL #2   Title  Pt will tell SLP/indicate 4 memory strategies to improve memory over two sessions with modified independence    Time  4    Period  Weeks    Status  New       Plan - 10/09/17 1518    Clinical Impression Statement  Pt presents with moderate-severe cognitive impairments, with deficits seen today in selective attention, reasoning, judgment, problem solving, emergent awareness, and short-term memory. Naming is also impaired, suspect secondary to cognitive deficits. Pt has poor immediate recall and delayed recall of functional information. Despite SLP telling pt purposes of evaluation and explaining that physical therapy would address physical deficits at his upcoming appointment on Friday, pt asked several  times about when physical issues would be addressed. Pt initially denied deficits; wife reports pt forgets things in the short term. He was independent with medications prior, however wife is managing these now. She is supervising finances, however pt continues to be involved. After evaluation, pt did acknowledge that he did not do well on the assessment and acknowledged his difficulties with problem solving and memory. Pt appeared reluctant to return for therapy however when confronted with impairments he did feel he could make a commitment to returning for therapy 1x per week. I educated pt and his wife extensively, providing suggestions for cognitive activities to do at home and encouraged pt to obtain a planner or memory notebook and begin keeping a record of his daily activities to promote recall. I explained that it was possible pt may have had mild cognitive decline prior to his hospitalization, and that with acute illness a seemingly rapid decline due to decreased ability to compensate. I recommend a brief course of ST focusing on compensatory measures for cognitive impairments to decrease caregiver burden, improve safety awareness and independence. Will consider extending duration or frequency of ST depending on pt's willingness to participate in therapy, or with significant improvements in awareness, insight.     Speech Therapy Frequency  1x /week    Duration  4 weeks    Treatment/Interventions  Compensatory strategies;Patient/family education;SLP instruction and feedback;Functional tasks;Internal/external aids;Cognitive reorganization    Potential to Achieve Goals  Fair    Potential Considerations  Severity of impairments;Ability to learn/carryover information;Cooperation/participation level    SLP Home Exercise Plan  WARM strategies provided and cognitive activities for home, pt to obtain weekly planner or memory system    Consulted and Agree with Plan of Care  Patient;Family member/caregiver     Family Member Consulted  wife [name]       Patient will benefit from skilled therapeutic intervention in order to improve the following deficits and impairments:   Cognitive communication deficit  Problem List Patient Active Problem List   Diagnosis Date Noted  . Encephalopathy 08/18/2017  . Fever   . SIADH (syndrome of inappropriate ADH production) ([place])   . PAF (paroxysmal atrial fibrillation) ([place])   . Type 2 diabetes mellitus with peripheral neuropathy ([facility])   . Hypokalemia   . Urinary retention   . Acute blood loss anemia   . Hypothyroidism   . Spinal stenosis 08/12/2017  . 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 Type 2 diabetes mellitus with other specified complication ([place]) 74/60/0298  . Hyponatremia 08/12/2017  . Atrial fibrillation ([place]) 01/16/2016  . Rheumatoid arthritis ([place]) 01/16/2016  . History of artificial joint 12/19/2014  . H/O knee surgery 12/19/2014  . Primary osteoarthritis of both knees 10/27/2014  . Osteopenia 03/31/2014  . HEARTBURN 09/04/2009   Deneise Lever, [place], [place] 10/09/2017, 5:21 PM  [facility] [address] [place], [place], [address] Phone: [phone]   Fax:  [phone]  Name: NUMAN ZYLSTRA MRN: [id number] Date of Birth: 12-26-1940

## 2017-10-09 NOTE — Patient Instructions (Signed)
Memory Compensation Strategies  1. Use "WARM" strategy. W= write it down A=  associate it R=  repeat it M=  make a mental picture  2. You can keep a Social worker. Use a 3-ring notebook with sections for the following:  calendar, important names and phone numbers, medications, doctors' names/phone numbers, "to do list"/reminders, and a section to journal what you did each day  3. Use a calendar to write appointments down.  4. Write yourself a schedule for the day.  This can be placed on the calendar or in a separate section of the Memory Notebook.  Keeping a regular schedule can help memory.  5. Use medication organizer with sections for each day or morning/evening pills  You may need help loading it  6. Keep a basket, or pegboard by the door.   Place items that you need to take out with you in the basket or on the pegboard.  You may also want to include a message board for reminders.  7. Use sticky notes. Place sticky notes with reminders in a place where the task is performed.  For example:  "turn off the stove" placed by the stove, "lock the door" placed on the door at eye level, "take your medications" on the bathroom mirror or by the place where you normally take your medications  8. Use alarms/timers.  Use while cooking to remind yourself to check on food or as a reminder to take your medicine, or as a reminder to make a call, or as a reminder to perform another task, etc.  9. Use a small tape recorder to record important information and notes for yourself.    Cognitive Activities you can do at home:   - [place] (easy level)  - [place], or read/watch financial news. Write down some key points or have a discussion with your wife about what you read or watched.   On your computer, tablet or  phone: BrainHQ Brainbashers.[employer] Neuron[employer] App [employer] Game App [employer] IQ [employer] Sort it out (easy) Photo Quiz  - what's the word Mix 2 Words Spot the difference games

## 2017-10-10 ENCOUNTER — Other Ambulatory Visit: Payer: Self-pay | Admitting: Physical Medicine and Rehabilitation

## 2017-10-10 ENCOUNTER — Telehealth: Payer: Self-pay

## 2017-10-10 NOTE — Telephone Encounter (Signed)
He has ongoing cognitive deficits ( I sent him to speech therapy). At the very least, he needs to wait until his two month follow up with me.

## 2017-10-10 NOTE — Telephone Encounter (Signed)
Patient called, stated that he forgot to ask if he, in your medical opinion, can drive at this time and if not then when do you suggest?

## 2017-10-11 NOTE — Telephone Encounter (Signed)
Called to inform patient of doctors response, no answer, left VM to return call

## 2017-10-13 ENCOUNTER — Ambulatory Visit: Payer: Medicare Other

## 2017-10-13 ENCOUNTER — Other Ambulatory Visit: Payer: Self-pay

## 2017-10-13 DIAGNOSIS — R2689 Other abnormalities of gait and mobility: Secondary | ICD-10-CM

## 2017-10-13 DIAGNOSIS — R2681 Unsteadiness on feet: Secondary | ICD-10-CM

## 2017-10-13 DIAGNOSIS — R41841 Cognitive communication deficit: Secondary | ICD-10-CM | POA: Diagnosis not present

## 2017-10-13 DIAGNOSIS — M6281 Muscle weakness (generalized): Secondary | ICD-10-CM

## 2017-10-13 NOTE — Therapy (Signed)
[facility] [address] [place], [place], [address] Phone: [phone]   Fax:  [phone]  Physical Therapy Evaluation  Patient Details  Name: [name] MRN: [id number] Date of Birth: [birth year] Referring Provider: Dr. Naaman Plummer   Encounter Date: 10/13/2017  PT End of Session - 10/13/17 1136    Visit Number  1    Number of Visits  17    Date for PT Re-Evaluation  12/12/17    Authorization Type  UHC Medicare    PT Start Time  [phone]    PT Stop Time  1017    PT Time Calculation (min)  41 min    Equipment Utilized During Treatment  -- min guard to S prn    Activity Tolerance  Patient tolerated treatment well    Behavior During Therapy  [facility] for tasks assessed/performed with intermittent bouts of irritation regarding why he cannot drive       Past Medical History:  Diagnosis Date  . DM (diabetes mellitus) ([place])   . GERD (gastroesophageal reflux disease)   . Hyperlipidemia   . Hypertension   . Legionella pneumonia ([employer])   . Osteoarthritis   . Osteopenia   . Rheumatoid arthritis ([place])   . Thyroid disease     Past Surgical History:  Procedure Laterality Date  . COLONOSCOPY      There were no vitals filed for this visit.   Subjective Assessment - 10/13/17 0945    Subjective  Pt was hospitalized on 08/12/17 for encephalopathy and d/c on 08/26/17. Pt received CIR and then HHPT for 3 weeks after d/c and now presents to OPPT neuro amb. with SPC and wishing to improve balance. Pt has hx of spinal stenosis, which incr. back pain. Pt has had 2 falls in the last 6 months, while walking without an AD. Pt very concerned with getting back to driving, per Dr. Charm Barges notes, he would like pt to wait at least 2 more months 2/2 cognitive deficits.     Patient is accompained by:  Family member wife: [name]-provided information    Pertinent History  A-fib, HTN, DM, hypothyroidism, RA, osteopenia, spinal stenosis (per pt), OA  in B knees    Patient Stated Goals  Improve balance and walk safely, get back to driving    Currently in Pain?  Yes    Pain Score  -- 1-2/10 seated, 5-6/10 during amb.    Pain Location  Back    Pain Orientation  Right;Lower    Pain Descriptors / Indicators  Sharp;Shooting    Pain Type  Chronic pain    Pain Onset  More than a month ago    Pain Frequency  Constant    Aggravating Factors   walking     Pain Relieving Factors  steroids help for a bit         [facility] PT Assessment - 10/13/17 0952      Assessment   Medical Diagnosis  Multifactorial gait disorder, encephalopathy    Referring Provider  Dr. Naaman Plummer    Onset Date/Surgical Date  08/12/17    Hand Dominance  Right    Prior Therapy  CIR and HHPT      Precautions   Precautions  Fall;Other (comment) spinal stenosis      Restrictions   Weight Bearing Restrictions  No      Balance Screen   Has the patient fallen in the past 6 months  Yes    How many times?  2  Has the patient had a decrease in activity level because of a fear of falling?   Yes    Is the patient reluctant to leave their home because of a fear of falling?   Yes      [place]  Private residence    Living Arrangements  Spouse/significant other    Available Help at Discharge  Family    Type of [place] to enter    Entrance Stairs-Number of Steps  2-3    Entrance Stairs-Rails  None    Home Layout  Two level;Able to live on main level with bedroom/bathroom    Alternate Level Stairs-Number of Steps  12    Alternate Level Stairs-Rails  Left    Home Equipment  Walker - 2 wheels;Kasandra Knudsen - single point      Prior Function   Level of Independence  Independent    Vocation  Retired    Leisure  [employer], check the market, go out dinner      Cognition   Overall Cognitive Status  Impaired/Different from baseline    Area of Impairment  -- see Speech notes for details.       Sensation   Light Touch  Appears  Intact    Additional Comments  Pt denied N/T.       Coordination   Gross Motor Movements are Fluid and Coordinated  Yes    Fine Motor Movements are Fluid and Coordinated  Yes    Heel Shin Test  WNL and RAMs WNL      Posture/Postural Control   Posture/Postural Control  Postural limitations    Postural Limitations  Forward head;Posterior pelvic tilt      Tone   Assessment Location  Other (comment) denied spasms      ROM / Strength   AROM / PROM / Strength  AROM;Strength      AROM   Overall AROM   Within functional limits for tasks performed    Overall AROM Comments  BUE/LE WFL      Strength   Overall Strength  Deficits    Overall Strength Comments  BUE WFL, RLE: hip flex: 3+/5, knee ext: 4/5, knee flex: 4-/5, ankle DF: 4/5, seated hip abd/add grossly: 3+/5 LLE: hip flex: 4/5, knee ext: 4+/5, knee flex: 4/5, ankle DF: 4/5 seated hip abd/add grossly: 4-/5.      Transfers   Transfers  Sit to Stand;Stand to Sit    Sit to Stand  5: Supervision;With upper extremity assist;From chair/3-in-1    Stand to Sit  5: Supervision;With upper extremity assist;To chair/3-in-1    Comments  Unable to perform STS txfs without UE support.       Ambulation/Gait   Ambulation/Gait  Yes    Ambulation/Gait Assistance  5: Supervision;4: Min guard    Ambulation/Gait Assistance Details  Incr. sway and LOB (corrected with SPC and stepping strategy).     Ambulation Distance (Feet)  200 Feet    Assistive device  Straight cane    Gait Pattern  Step-through pattern;Decreased stride length;Decreased trunk rotation;Trendelenburg;Narrow base of support intermittent narrow BOS    Ambulation Surface  Level;Indoor    Gait velocity  2.12f/sec with SPC      Balance   Balance Assessed  Yes      Static Standing Balance   Static Standing - Balance Support  No upper extremity supported    Static Standing - Level of Assistance  4: Min Solicitor Balance -  Activities   Single Leg Stance - Right  Leg;Single Leg Stance - Left Leg    Static Standing - Comment/# of Minutes  Min A or UE support required to maintain balance.       Standardized Balance Assessment   Standardized Balance Assessment  Dynamic Gait Index      Dynamic Gait Index   Level Surface  Moderate Impairment    Change in Gait Speed  Moderate Impairment    Gait with Horizontal Head Turns  Moderate Impairment    Gait with Vertical Head Turns  Moderate Impairment    Gait and Pivot Turn  Mild Impairment    Step Over Obstacle  Mild Impairment    Step Around Obstacles  Mild Impairment    Steps  Moderate Impairment    Total Score  11    DGI comment:  11/24: indicates high falls risk.                 Objective measurements completed on examination: See above findings.              PT Education - 10/13/17 1134    Education provided  Yes    Education Details  PT discussed outcome measures, PT POC, frequency, and duration. PT encouraged pt (and pt's wife 2/2 cognitive deficits) to call Dr. Naaman Plummer regarding driving recommendations, per MD notes he would like pt to wait at least 2 months before he drives again.     Person(s) Educated  Patient;Spouse    Methods  Explanation    Comprehension  Verbalized understanding;Need further instruction       PT Short Term Goals - 10/13/17 1149      PT SHORT TERM GOAL #1   Title  Pt will be IND in HEP in order to improve strength, balance, and safety. TARGET DATE FOR ALL LTGS: 11/10/17    Status  New      PT SHORT TERM GOAL #2   Title  Pt will improve DGI score to >/=15/24 to decr. falls risk.     Baseline  11/24    Status  New      PT SHORT TERM GOAL #3   Title  Pt will improve gait speed with LRAD to >/=2.2f/sec. in order to safely amb. in the community.     Baseline  2.441fsec. with SPC    Status  New      PT SHORT TERM GOAL #4   Title  Pt will amb. 500' with LRAD at MOD I level over even terrain to improve safety during functional mobility.      Status  New        PT Long Term Goals - 10/13/17 1211      PT LONG TERM GOAL #1   Title  Pt will verbalize understanding of fall prevention strategies to reduce falls risk. TARGET DATE FOR ALL LTGS: 12/08/17    Status  New      PT LONG TERM GOAL #2   Title  Pt will amb. 700' over even/uneven terrain at MOD I level with LRAD to improve safety during functional mobility.     Status  New      PT LONG TERM GOAL #3   Title  Pt will improve DGI score to >/=20/24 to decr. falls risk.     Status  New      PT LONG TERM GOAL #4   Title  Pt  will amb. 100' no AD, with S, to amb. safely at home.     Status  New             Plan - 10/13/17 1137    Clinical Impression Statement  Pt is a 77y/o male presenting to OPPT neuro after hospitalization (08/12/17-08/26/17) for encephalopathy, resulting in gait abnormalities. Pt's PMH significant for the following: A-fib, HTN, DM, hypothyroidism, RA, osteopenia, spinal stenosis (per pt), OA in B knees.  The following deficits were noted upon exam: gait deviations, impaired balance, decr. safety awareness, impaired flexibility, impaired posture, and decr. strength. Pt's gait speed (with SPC) indicates pt is not able to safely amb. in the community. Pt's DGI score indicates pt is at a high risk for falls. Pt would benefit from skilled PT to improve safety during functional mobility.    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     PT Frequency  2x / week with potential to d/c early    PT Duration  8 weeks    PT Treatment/Interventions  ADLs/Self Care Home  Management;Biofeedback;Canalith Repostioning;Electrical Stimulation;Therapeutic exercise;Therapeutic activities;Manual techniques;Vestibular;Orthotic Fit/Training;Functional mobility training;Stair training;Gait training;Patient/family education;DME Instruction;Neuromuscular re-education;Balance training    PT Next Visit Plan  Initiate OTAGO HEP. Monitor BP 2/2 medical hx. Assess amb. without cane, high level balance, pertubations. Fall risk education.     Consulted and Agree with Plan of Care  Patient;Family member/caregiver    Family Member Consulted  wife: [name]       Patient will benefit from skilled therapeutic intervention in order to improve the following deficits and impairments:  Abnormal gait, Decreased endurance, Decreased knowledge of use of DME, Decreased strength, Decreased mobility, Decreased balance, Decreased cognition, Impaired flexibility, Postural dysfunction, Decreased safety awareness, Pain(PT will closely monitor pain but will not directly address)  Visit Diagnosis: Other abnormalities of gait and mobility - Plan: PT plan of care cert/re-cert  Muscle weakness (generalized) - Plan: PT plan of care cert/re-cert  Unsteadiness on feet - Plan: PT plan of care cert/re-cert     Problem List Patient Active Problem List   Diagnosis Date Noted  . Encephalopathy 08/18/2017  . Fever   . SIADH (syndrome of inappropriate ADH production) ([place])   . PAF (paroxysmal atrial fibrillation) ([place])   . Type 2 diabetes mellitus with peripheral neuropathy (HCC)   . Hypokalemia   . Urinary retention   . Acute blood loss anemia   . Hypothyroidism   . Spinal stenosis 08/12/2017  . 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 Type 2 diabetes mellitus with other specified complication ([place]) 85/06/7739  . Hyponatremia 08/12/2017  . Atrial fibrillation ([place]) 01/16/2016  . Rheumatoid arthritis ([place]) 01/16/2016  . History of artificial joint 12/19/2014  . H/O knee surgery 12/19/2014  . Primary  osteoarthritis of both knees 10/27/2014  . Osteopenia 03/31/2014  . HEARTBURN 09/04/2009    Kenise Barraco L 10/13/2017, 12:17 PM  [facility] [address] [place], [place], [address] Phone: [phone]   Fax:  [phone]  Name: [name] MRN: [id number] Date  of Birth: [birth year]  Geoffry Paradise, PT,DPT 10/13/17 12:20 PM Phone: [phone] Fax: [phone]

## 2017-10-20 ENCOUNTER — Ambulatory Visit: Payer: Medicare Other | Admitting: Rehabilitation

## 2017-10-20 ENCOUNTER — Encounter: Payer: Self-pay | Admitting: Rehabilitation

## 2017-10-20 DIAGNOSIS — R41841 Cognitive communication deficit: Secondary | ICD-10-CM | POA: Diagnosis not present

## 2017-10-20 DIAGNOSIS — R2681 Unsteadiness on feet: Secondary | ICD-10-CM

## 2017-10-20 DIAGNOSIS — R2689 Other abnormalities of gait and mobility: Secondary | ICD-10-CM

## 2017-10-20 DIAGNOSIS — M6281 Muscle weakness (generalized): Secondary | ICD-10-CM

## 2017-10-20 NOTE — Therapy (Signed)
[facility] [address] [place], [place], [address] Phone: [phone]   Fax:  [phone]  Physical Therapy Treatment  Patient Details  Name: [name] MRN: [id number] Date of Birth: Sep 24, 1940 Referring Provider: Dr. Naaman Plummer   Encounter Date: 10/20/2017  PT End of Session - 10/20/17 1252    Visit Number  2    Number of Visits  17    Date for PT Re-Evaluation  12/12/17    Authorization Type  UHC Medicare    PT Start Time  1017    PT Stop Time  1101    PT Time Calculation (min)  44 min    Equipment Utilized During Treatment  -- min guard to S prn    Activity Tolerance  Patient tolerated treatment well    Behavior During Therapy  [facility] for tasks assessed/performed with intermittent bouts of irritation regarding why he cannot drive       Past Medical History:  Diagnosis Date  . DM (diabetes mellitus) ([place])   . GERD (gastroesophageal reflux disease)   . Hyperlipidemia   . Hypertension   . Legionella pneumonia ([place])   . Osteoarthritis   . Osteopenia   . Rheumatoid arthritis ([place])   . Thyroid disease     Past Surgical History:  Procedure Laterality Date  . COLONOSCOPY      There were no vitals filed for this visit.  Subjective Assessment - 10/20/17 1018    Subjective  Pt reports no changes since last visit.  No falls.     Pertinent History  A-fib, HTN, DM, hypothyroidism, RA, osteopenia, spinal stenosis (per pt), OA in B knees    Patient Stated Goals  Improve balance and walk safely, get back to driving    Currently in Pain?  No/denies                            Balance Exercises - 10/20/17 1039      OTAGO PROGRAM   Head Movements  Sitting;5 reps    Neck Movements  5 reps;Sitting    Back Extension  -- skipped due to stenosis    Trunk Movements  -- skipped due to stenosis    Ankle Movements  Sitting;10 reps    Knee Extensor  10 reps    Knee Flexor  10 reps    Hip  ABductor  10 reps    Ankle Plantorflexors  20 reps, support    Ankle Dorsiflexors  20 reps, support    Knee Bends  10 reps, support    Backwards Walking  Support    Sideways Walking  Assistive device counter top    Tandem Stance  10 seconds, support    Tandem Walk  Support    One Leg Stand  10 seconds, support    Heel Walking  Support    Toe Walk  Support        PT Education - 10/20/17 1018    Education provided  Yes    Education Details  OTAGO HEP    Person(s) Educated  Patient    Methods  Explanation;Demonstration;Handout    Comprehension  Verbalized understanding;Returned demonstration       PT Short Term Goals - 10/13/17 1149      PT SHORT TERM GOAL #1   Title  Pt will be IND in HEP in order to improve strength, balance, and safety. TARGET DATE FOR ALL LTGS: 11/10/17  Status  New      PT SHORT TERM GOAL #2   Title  Pt will improve DGI score to >/=15/24 to decr. falls risk.     Baseline  11/24    Status  New      PT SHORT TERM GOAL #3   Title  Pt will improve gait speed with LRAD to >/=2.57f/sec. in order to safely amb. in the community.     Baseline  2.44fsec. with SPC    Status  New      PT SHORT TERM GOAL #4   Title  Pt will amb. 500' with LRAD at MOD I level over even terrain to improve safety during functional mobility.     Status  New        PT Long Term Goals - 10/13/17 1211      PT LONG TERM GOAL #1   Title  Pt will verbalize understanding of fall prevention strategies to reduce falls risk. TARGET DATE FOR ALL LTGS: 12/08/17    Status  New      PT LONG TERM GOAL #2   Title  Pt will amb. 700' over even/uneven terrain at MOD I level with LRAD to improve safety during functional mobility.     Status  New      PT LONG TERM GOAL #3   Title  Pt will improve DGI score to >/=20/24 to decr. falls risk.     Status  New      PT LONG TERM GOAL #4   Title  Pt will amb. 100' no AD, with S, to amb. safely at home.     Status  New            Plan -  10/20/17 1252    Clinical Impression Statement  Skilled session focused on initiating OTAGO HEP for BLE strengthening and balance.  See flowsheets for exercises performed. PT provided education on frequency of performing exercises.     Rehab Potential  Good    Clinical Impairments Affecting Rehab Potential  see above.     PT Frequency  2x / week with potential to d/c early    PT Duration  8 weeks    PT Treatment/Interventions  ADLs/Self Care Home Management;Biofeedback;Canalith Repostioning;Electrical Stimulation;Therapeutic exercise;Therapeutic activities;Manual techniques;Vestibular;Orthotic Fit/Training;Functional mobility training;Stair training;Gait training;Patient/family education;DME Instruction;Neuromuscular re-education;Balance training    PT Next Visit Plan  Monitor BP 2/2 medical hx. Assess amb. without cane, high level balance, pertubations. Fall risk education.     Consulted and Agree with Plan of Care  Patient       Patient will benefit from skilled therapeutic intervention in order to improve the following deficits and impairments:  Abnormal gait, Decreased endurance, Decreased knowledge of use of DME, Decreased strength, Decreased mobility, Decreased balance, Decreased cognition, Impaired flexibility, Postural dysfunction, Decreased safety awareness, Pain(PT will closely monitor pain but will not directly address)  Visit Diagnosis: Other abnormalities of gait and mobility  Muscle weakness (generalized)  Unsteadiness on feet     Problem List Patient Active Problem List   Diagnosis Date Noted  . Encephalopathy 08/18/2017  . Fever   . SIADH (syndrome of inappropriate ADH production) ([facility]  . PAF (paroxysmal atrial fibrillation) ([facility]  . Type 2 diabetes mellitus with peripheral neuropathy (HCC)   . Hypokalemia   . Urinary retention   . Acute blood loss anemia   . Hypothyroidism   . Spinal stenosis 08/12/2017  . 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 Type 2 diabetes mellitus  with other specified complication ([place]) 16/02/9603  . Hyponatremia 08/12/2017  . Atrial fibrillation ([place]) 01/16/2016  . Rheumatoid arthritis ([place]) 01/16/2016  . History of artificial joint 12/19/2014  . H/O knee surgery 12/19/2014  . Primary osteoarthritis of both knees 10/27/2014  . Osteopenia 03/31/2014  . HEARTBURN 09/04/2009    Cameron Sprang, PT, MPT [facility] [address] [place], [place], [address] Phone: [phone]   Fax:  [phone] 10/20/17, 12:54 PM  Name: [name] MRN: [id number] Date of Birth: April 28, 1941

## 2017-10-26 ENCOUNTER — Ambulatory Visit: Payer: Medicare Other | Attending: Physical Medicine & Rehabilitation

## 2017-10-26 DIAGNOSIS — R41841 Cognitive communication deficit: Secondary | ICD-10-CM | POA: Insufficient documentation

## 2017-10-26 DIAGNOSIS — R2689 Other abnormalities of gait and mobility: Secondary | ICD-10-CM | POA: Insufficient documentation

## 2017-10-26 DIAGNOSIS — R2681 Unsteadiness on feet: Secondary | ICD-10-CM | POA: Diagnosis present

## 2017-10-26 DIAGNOSIS — M6281 Muscle weakness (generalized): Secondary | ICD-10-CM | POA: Diagnosis present

## 2017-10-26 NOTE — Therapy (Signed)
[facility] [address] [place], [place], [address] Phone: [phone]   Fax:  [phone]  Speech Language Pathology Treatment  Patient Details  Name: [name] MRN: [id number] Date of Birth: 05-10-1941 Referring Provider: Dr. Naaman Plummer   Encounter Date: 10/26/2017  End of Session - 10/26/17 1820    Visit Number  2    Number of Visits  5    Date for SLP Re-Evaluation  12/15/17    SLP Start Time  5885    SLP Stop Time   1106    SLP Time Calculation (min)  44 min    Activity Tolerance  Patient tolerated treatment well       Past Medical History:  Diagnosis Date  . DM (diabetes mellitus) ([place])   . GERD (gastroesophageal reflux disease)   . Hyperlipidemia   . Hypertension   . Legionella pneumonia ([place])   . Osteoarthritis   . Osteopenia   . Rheumatoid arthritis ([place])   . Thyroid disease     Past Surgical History:  Procedure Laterality Date  . COLONOSCOPY      There were no vitals filed for this visit.  Subjective Assessment - 10/26/17 1025    Currently in Pain?  Yes    Pain Score  8     Pain Location  Back    Pain Orientation  Lower;Right    Pain Descriptors / Indicators  Sharp    Pain Type  Chronic pain    Pain Onset  More than a month ago    Pain Frequency  Constant    Aggravating Factors   walking    Pain Relieving Factors  steroids but "only for maybe two days"            ADULT SLP TREATMENT - 10/26/17 1027      General Information   Behavior/Cognition  Alert;Cooperative;Pleasant mood      Treatment Provided   Treatment provided  Cognitive-Linquistic      Cognitive-Linquistic Treatment   Treatment focused on  Cognition    Skilled Treatment  SLP educated pt/wife on memory compensations (warm). Given wife's report of pt improvement, SLP reassessed pt using Hopkins Verbal Learning Test and  MOCA (version 1). Pt performance on Hopkins still indicated memory deficits, and  performance on MOCA indicates con't (at LEAST) mild cognitive impairment. SLP asked pt if he would like to cont to be seen for ST given these results and he said he would like to cont to be seen. SLP told pt and wife goals generated from evaluation last week were still valid based upon information this SLP ascertained from today's session. Specifically on the [place], word recall, immediate recall of sentences, and clock drawing were particularly difficult for pt. Pt performance on working memory was surprisingly good when compared with skills in immedate recall. Pt score was 20/30 on MOCA and SLP explained to pt/wife that this score cont'd to indicate at least mild cognitive impairment. SLP encouraged pt and wife to continue tasks at home discussed in last session to be helpful in habitualizing memory compensations.       Assessment / Recommendations / Plan   Plan  Continue with current plan of care      Progression Toward Goals   Progression toward goals  -- Goals remain appropriate for pt       SLP Education - 10/26/17 1819    Education Details  MOCA and Hopkins results and ramifications; pt is still  appropriate for ST    Person(s) Educated  Patient;Spouse    Methods  Explanation    Comprehension  Verbalized understanding;Need further instruction         SLP Long Term Goals - 10/26/17 1825      SLP LONG TERM GOAL #1   Title  Pt will utilize external aids for medication, schedule management/functional recall with occasional min A from family outside of therapy over 2 sessions    Time  4    Period  Weeks    Status  On-going      SLP LONG TERM GOAL #2   Title  Pt will tell SLP/indicate 4 memory strategies to improve memory over two sessions with modified independence    Time  4    Period  Weeks    Status  On-going       Plan - 10/26/17 1821    Clinical Impression Statement  Pt presents with cont'd (at least) moderate cognitive impairments. With wife's report of pt  "doing better" and suggestion of re-assessment, SLP did this and found pt's status primrily unchanged from his initial evaluation.  Pt told SLP he would like to cont to be seen for therapy x1/week, when presented with these results.     Speech Therapy Frequency  1x /week    Duration  4 weeks    Treatment/Interventions  Compensatory strategies;Patient/family education;SLP instruction and feedback;Functional tasks;Internal/external aids;Cognitive reorganization    Potential to Achieve Goals  Fair    Potential Considerations  Severity of impairments;Ability to learn/carryover information;Cooperation/participation level    SLP Home Exercise Plan  WARM strategies provided and cognitive activities for home, pt to obtain weekly planner or memory system    Consulted and Agree with Plan of Care  Patient;Family member/caregiver    Family Member Consulted  wife [name]       Patient will benefit from skilled therapeutic intervention in order to improve the following deficits and impairments:   Cognitive communication deficit    Problem List Patient Active Problem List   Diagnosis Date Noted  . Encephalopathy 08/18/2017  . Fever   . SIADH (syndrome of inappropriate ADH production) ([place])   . PAF (paroxysmal atrial fibrillation) ([place])   . Type 2 diabetes mellitus with peripheral neuropathy (HCC)   . Hypokalemia   . Urinary retention   . Acute blood loss anemia   . Hypothyroidism   . Spinal stenosis 08/12/2017  . 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 Type 2 diabetes mellitus with other specified complication ([place]) 91/50/5697  . Hyponatremia 08/12/2017  . Atrial fibrillation ([place]) 01/16/2016  . Rheumatoid arthritis ([place]) 01/16/2016  . History of artificial joint 12/19/2014  . H/O knee surgery 12/19/2014  . Primary osteoarthritis of both knees 10/27/2014  . Osteopenia 03/31/2014  . HEARTBURN 09/04/2009    [place] ,[place], [place]  10/26/2017, 6:26 PM  [facility] [address] [place], [place], [address] Phone: [phone]   Fax:  [phone]   Name: JADIE COMAS MRN: [id number] Date of Birth: 1940-09-20

## 2017-10-26 NOTE — Patient Instructions (Signed)
Your re-testing today continues to indicate at least mild cognitive impairment with memory deficits.

## 2017-10-27 ENCOUNTER — Ambulatory Visit: Payer: Medicare Other | Admitting: Physical Therapy

## 2017-10-27 ENCOUNTER — Encounter: Payer: Self-pay | Admitting: Physical Therapy

## 2017-10-27 VITALS — BP 113/71 | HR 68

## 2017-10-27 DIAGNOSIS — R2689 Other abnormalities of gait and mobility: Secondary | ICD-10-CM

## 2017-10-27 DIAGNOSIS — R2681 Unsteadiness on feet: Secondary | ICD-10-CM

## 2017-10-27 DIAGNOSIS — R41841 Cognitive communication deficit: Secondary | ICD-10-CM | POA: Diagnosis not present

## 2017-10-27 DIAGNOSIS — M6281 Muscle weakness (generalized): Secondary | ICD-10-CM

## 2017-10-27 NOTE — Therapy (Signed)
[name] [address] [place], [place], [address] Phone: [phone]   Fax:  [phone]  Physical Therapy Treatment  Patient Details  Name: [name] MRN: [id number] Date of Birth: [birth year] Referring Provider: Dr. Naaman Plummer   Encounter Date: 10/27/2017  PT End of Session - 10/27/17 0939    Visit Number  3    Number of Visits  17    Date for PT Re-Evaluation  12/12/17    Authorization Type  [employer] Medicare    PT Start Time  0933    PT Stop Time  1015    PT Time Calculation (min)  42 min    Equipment Utilized During Treatment  Gait belt    Activity Tolerance  Patient tolerated treatment well    Behavior During Therapy  [facility] for tasks assessed/performed       Past Medical History:  Diagnosis Date  . DM (diabetes mellitus) ([place])   . GERD (gastroesophageal reflux disease)   . Hyperlipidemia   . Hypertension   . Legionella pneumonia ([place])   . Osteoarthritis   . Osteopenia   . Rheumatoid arthritis ([place])   . Thyroid disease     Past Surgical History:  Procedure Laterality Date  . COLONOSCOPY      Vitals:   10/27/17 0936  BP: 113/71  Pulse: 68    Subjective Assessment - 10/27/17 0936    Subjective  No new complaints. No falls or pain to report. Back is okay now as he took medication before therapy today. Plans to get an injection to help with back pain, has had them in the past.     Patient is accompained by:  Family member spouse Sudah- in lobby    Pertinent History  A-fib, HTN, DM, hypothyroidism, RA, osteopenia, spinal stenosis (per pt), OA in B knees    Patient Stated Goals  Improve balance and walk safely, get back to driving    Currently in Pain?  Yes    Pain Score  5     Pain Location  Back    Pain Orientation  Lower;Right    Pain Descriptors / Indicators  Shooting    Pain Type  Chronic pain    Pain Onset  More than a month ago    Pain Frequency  Constant    Aggravating Factors    walking    Pain Relieving Factors  tylenol           [facility] Adult PT Treatment/Exercise - 10/27/17 0941      Transfers   Transfers  Sit to Stand;Stand to Sit    Sit to Stand  5: Supervision;With upper extremity assist;From chair/3-in-1    Stand to Sit  5: Supervision;With upper extremity assist;To chair/3-in-1      Ambulation/Gait   Ambulation/Gait  Yes    Ambulation/Gait Assistance  4: Min guard;4: Min assist    Ambulation/Gait Assistance Details  mostly balance, occasional min assist needed with turns due to veering/staggering gait pattern. cues needed for posture, equal step length, and for wider base of support with gait to assist with balance.     Ambulation Distance (Feet)  115 Feet    Assistive device  None    Gait Pattern  Step-through pattern;Decreased stride length;Decreased trunk rotation;Trendelenburg;Narrow base of support    Ambulation Surface  Level;Indoor      High Level Balance   High Level Balance Activities  Braiding;Marching forwards;Marching backwards;Tandem walking tandem gait fwd/bwd    High Level  Balance Comments  on red mats next to counter top for balance assistance as needed: 3 laps each with min guard to min assist, cues on posture, weight shifting and ex form/technique.           Balance Exercises - 10/27/17 0955      Balance Exercises: Standing   Standing Eyes Closed  Narrow base of support (BOS);Wide (BOA);Foam/compliant surface;Head turns;Other reps (comment);30 secs;Limitations      Balance Exercises: Standing   Standing Eyes Closed Limitations  on airex in corner with chair in front for safety: wide base of support progressing to narrow base of support for EC no head movements. min guard to min assist with cues on weight shifting and posture to assist with balance correction; feet apart progressing to feet together for EC head movements left<>right, up<>down, and diagonals both ways. min assist needed for balance with cues on posture and weight  shifting.            PT Short Term Goals - 10/13/17 1149      PT SHORT TERM GOAL #1   Title  Pt will be IND in HEP in order to improve strength, balance, and safety. TARGET DATE FOR ALL LTGS: 11/10/17    Status  New      PT SHORT TERM GOAL #2   Title  Pt will improve DGI score to >/=15/24 to decr. falls risk.     Baseline  11/24    Status  New      PT SHORT TERM GOAL #3   Title  Pt will improve gait speed with LRAD to >/=2.32f/sec. in order to safely amb. in the community.     Baseline  2.436fsec. with SPC    Status  New      PT SHORT TERM GOAL #4   Title  Pt will amb. 500' with LRAD at MOD I level over even terrain to improve safety during functional mobility.     Status  New        PT Long Term Goals - 10/13/17 1211      PT LONG TERM GOAL #1   Title  Pt will verbalize understanding of fall prevention strategies to reduce falls risk. TARGET DATE FOR ALL LTGS: 12/08/17    Status  New      PT LONG TERM GOAL #2   Title  Pt will amb. 700' over even/uneven terrain at MOD I level with LRAD to improve safety during functional mobility.     Status  New      PT LONG TERM GOAL #3   Title  Pt will improve DGI score to >/=20/24 to decr. falls risk.     Status  New      PT LONG TERM GOAL #4   Title  Pt will amb. 100' no AD, with S, to amb. safely at home.     Status  New            Plan - 10/27/17 0939    Clinical Impression Statement  Today's skilled session worked on gait without AD with mild staggering noted at times with gait needing assist to correct. Remainder of session addressed balance reactions with focus on single leg stance, narrowed base of support, both on compliant surfaces. Pt was most challenged by narrowed base of support and/or with vision removed. Pt is progressing toward goals and should benefit from continued PT to progress toward unmet goals.     Rehab Potential  Good  Clinical Impairments Affecting Rehab Potential  see above.     PT Frequency   2x / week with potential to d/c early    PT Duration  8 weeks    PT Treatment/Interventions  ADLs/Self Care Home Management;Biofeedback;Canalith Repostioning;Electrical Stimulation;Therapeutic exercise;Therapeutic activities;Manual techniques;Vestibular;Orthotic Fit/Training;Functional mobility training;Stair training;Gait training;Patient/family education;DME Instruction;Neuromuscular re-education;Balance training    PT Next Visit Plan  Monitor BP 2/2 medical hx. continue to work on gait without cane, high level balance, pertubations. Fall risk education.     Consulted and Agree with Plan of Care  Patient       Patient will benefit from skilled therapeutic intervention in order to improve the following deficits and impairments:  Abnormal gait, Decreased endurance, Decreased knowledge of use of DME, Decreased strength, Decreased mobility, Decreased balance, Decreased cognition, Impaired flexibility, Postural dysfunction, Decreased safety awareness, Pain(PT will closely monitor pain but will not directly address)  Visit Diagnosis: Other abnormalities of gait and mobility  Muscle weakness (generalized)  Unsteadiness on feet     Problem List Patient Active Problem List   Diagnosis Date Noted  . Encephalopathy 08/18/2017  . Fever   . SIADH (syndrome of inappropriate ADH production) ([place])   . PAF (paroxysmal atrial fibrillation) ([place])   . Type 2 diabetes mellitus with peripheral neuropathy ([facility])   . Hypokalemia   . Urinary retention   . Acute blood loss anemia   . Hypothyroidism   . Spinal stenosis 08/12/2017  . 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 Type 2 diabetes mellitus with other specified complication ([place]) 53/66/4403  . Hyponatremia 08/12/2017  . Atrial fibrillation ([place]) 01/16/2016  . Rheumatoid arthritis ([place]) 01/16/2016  . History of artificial joint 12/19/2014  . H/O knee surgery 12/19/2014  . Primary osteoarthritis of both knees 10/27/2014  . Osteopenia 03/31/2014  . HEARTBURN  09/04/2009    Willow Ora, PTA, [place] [address], [place], [place] [address] [phone] 10/27/17, 5:12 PM   Name: JANSON LAMAR MRN: [id number] Date of Birth: [birth year]

## 2017-10-30 ENCOUNTER — Other Ambulatory Visit: Payer: Self-pay | Admitting: Rheumatology

## 2017-10-30 DIAGNOSIS — M545 Low back pain: Principal | ICD-10-CM

## 2017-10-30 DIAGNOSIS — G8929 Other chronic pain: Secondary | ICD-10-CM

## 2017-10-31 ENCOUNTER — Telehealth: Payer: Self-pay

## 2017-10-31 NOTE — Telephone Encounter (Signed)
If he is having more low back pain, we need to discuss in the office. We discussed his back at last visit but it wasn't the main focus.

## 2017-10-31 NOTE — Telephone Encounter (Signed)
Recieved electronic medication refill request for Thiamine 157m along with trazodone 577m  Last notes did not mention any medication refills and has never had a refill from this clinic on any medication.  Unsure if ok to refill this or not.  Please advise.

## 2017-10-31 NOTE — Telephone Encounter (Signed)
-----   Message from Roetta Sessions sent at 10/25/2017  1:41 PM EDT ----- Regarding: ptn requesting injections Patient called and states husbands back pain is increasing and you have discussed injections wants to know if you will proceed

## 2017-10-31 NOTE — Telephone Encounter (Signed)
Awaiting response from provider

## 2017-11-01 ENCOUNTER — Other Ambulatory Visit: Payer: Self-pay

## 2017-11-01 ENCOUNTER — Ambulatory Visit (INDEPENDENT_AMBULATORY_CARE_PROVIDER_SITE_OTHER): Payer: Medicare Other | Admitting: Diagnostic Neuroimaging

## 2017-11-01 ENCOUNTER — Encounter: Payer: Self-pay | Admitting: Diagnostic Neuroimaging

## 2017-11-01 ENCOUNTER — Encounter

## 2017-11-01 VITALS — BP 107/59 | HR 64 | Ht 67.0 in | Wt 157.0 lb

## 2017-11-01 DIAGNOSIS — R413 Other amnesia: Secondary | ICD-10-CM

## 2017-11-01 MED ORDER — TRAZODONE HCL 50 MG PO TABS: 50.0000 mg | ORAL_TABLET | Freq: Every evening | ORAL | 0 refills | Status: DC | PRN

## 2017-11-01 NOTE — Telephone Encounter (Signed)
Refilled prn trazodone. Can stop thiamine

## 2017-11-01 NOTE — Progress Notes (Signed)
[facility]  PATIENT: [name] DOB: 1941-03-15  REFERRING CLINICIAN: Love, P HISTORY FROM: patient and wife and chart review  REASON FOR VISIT: new consult    HISTORICAL  CHIEF COMPLAINT:  Chief Complaint  Patient presents with  . Encephalopathy    rm 6, New Pt, wife- Sudah    HISTORY OF PRESENT ILLNESS:   HPI (11/01/17, VRP): 77 year old male with atrial for ablation, diabetes, rheumatoid arthritis, here for evaluation of encephalopathy, confusion, memory loss.  Patient was admitted to the hospital in March 2019 for generalized weakness, falls, altered mental status and confusion.  At that time patient had hyponatremia, pneumonia, and possibility of dementia versus cerebral small vessel ischemic disease versus late effects of alcohol or other toxic metabolic encephalopathy was raised.  Patient was in the hospital for 1 week and then inpatient rehabilitation for 1 week.  Since that time patient is gradually improving.  He continues to have some mild memory problems, confusion, short temper and frustration, typically later in the evening.  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  For the previous 1 to 2 years patient had been having some mild short-term memory loss in conversation with family and friends, but no significant decline in ADLs.  Now patient has limitation of ADLs mainly due to his physical limitations.  Patient and wife feel that patient is improving, but not back to his baseline from December 2018.  Patient wife notes that in the past when patient would drink wine, he would have slurred speech, memory problems and confusion.  Patient has stopped drinking wine since March 2019.   Neuropsychology consult 08/23/17 (Dr. 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  Symptoms continued to worsen with onset of sundowning and was admitted on 08/11/2017 for work up.  Family reported gradual steady memory decline with obserbable worsening I past few weeks to months.  Imaging suggested not acute or subacute processes but chronic SVD and subcortical white matter changes with cerebral atrophy.  Neurology felt subacute changes over past few months may be due to metabolic encephalopathy due to acute hyponatremia, possible ETOH withdrawal vs Wernicke's encephalopathy vs underlying dementia or sensory dementia.  Medical chart indicates daily alcohol use and he was started on IV thiamine.  During clinical interview today patient denied daily ETOH use and only a few days with drinking wine.    While the patient has improved some cognitive functioning during hospital course the continue to be stable and continuing cogntive deficits.  These are related to overall speed of mental operations and free recall and some cued recall memory deficits.  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  The patient does not attempt to confabulate to adjust to memory deficits.  He does stay on topics that are well learned and deficits appear to be assoiated with new learning events.  Balance and gait changes are also likely related to the underlying SVD and any metabolic issues exacerbating symptoms.   Diagnosis: White matter SVD, microvascular disease   REVIEW OF SYSTEMS: Full 14 system review of  systems performed and negative with exception of: Memory loss confusion easy bruising feeling cold.  ALLERGIES: Allergies  Allergen Reactions  . Hydrocodone Nausea Only    HOME  MEDICATIONS: Outpatient Medications Prior to Visit  Medication Sig Dispense Refill  . acetaminophen (TYLENOL) 325 MG tablet Take 1-2 tablets (325-650 mg total) by mouth every 4 (four) hours as needed for mild pain.    Marland Kitchen apixaban (ELIQUIS) 5 MG TABS tablet Take 5 mg by mouth 2 (two) times daily.    . diclofenac sodium (VOLTAREN) 1 % GEL Apply topically 4 (four) times daily.    Marland Kitchen esomeprazole (NEXIUM) 40 MG capsule Take 40 mg by mouth daily after supper.   1  . flecainide (TAMBOCOR) 50 MG tablet Take 1 tablet (50 mg total) by mouth every 12 (twelve) hours. 60 tablet 0  . folic acid (FOLVITE) 1 MG tablet Take 1 tablet (1 mg total) by mouth daily. 30 tablet 0  . levothyroxine (SYNTHROID, LEVOTHROID) 75 MCG tablet Take 75 mcg by mouth daily before breakfast.    . metFORMIN (GLUCOPHAGE-XR) 500 MG 24 hr tablet Take 1 tablet (500 mg total) by mouth daily with breakfast.  0  . metoprolol tartrate (LOPRESSOR) 25 MG tablet Take 1 tablet (25 mg total) by mouth 2 (two) times daily. 60 tablet 0  . potassium chloride SA (K-DUR,KLOR-CON) 20 MEQ tablet Take 1 tablet (20 mEq total) by mouth daily. 30 tablet 0  . rosuvastatin (CRESTOR) 10 MG tablet Take 1 tablet (10 mg total) by mouth daily. 30 tablet 0  . saccharomyces boulardii (FLORASTOR) 250 MG capsule Take 1 capsule (250 mg total) by mouth 2 (two) times daily.    . traZODone (DESYREL) 50 MG tablet Take 1 tablet (50 mg total) by mouth at bedtime as needed for sleep. 30 tablet 0  . vitamin B-12 1000 MCG tablet Take 1 tablet (1,000 mcg total) by mouth daily.    . sertraline (ZOLOFT) 50 MG tablet 50 mg. 11/01/17 has not started  6   No facility-administered medications prior to visit.     PAST MEDICAL HISTORY: Past Medical History:  Diagnosis Date  . DM (diabetes mellitus) ([place])   . GERD (gastroesophageal reflux disease)   . Hyperlipidemia   . Hypertension   . Legionella pneumonia ([place])   . Osteoarthritis   . Osteopenia   . Rheumatoid arthritis ([place])     . Thyroid disease     PAST SURGICAL HISTORY: Past Surgical History:  Procedure Laterality Date  . COLONOSCOPY    . KNEE SURGERY Bilateral    to clear out arthritis    FAMILY HISTORY: Family History  Problem Relation Age of Onset  . Diabetes Mellitus I Father   . Diabetes Mellitus I Mother   . CAD Brother     SOCIAL HISTORY:  Social History   Socioeconomic History  . Marital status: Married    Spouse name: Not on file  . Number of children: Not on file  . Years of education: Not on file  . Highest education level: Not on file  Occupational History  . Not on file  Social Needs  . Financial resource strain: Not on file  . Food insecurity:    Worry: Not on file    Inability: Not on file  . Transportation needs:    Medical: Not on file    Non-medical: Not on file  Tobacco Use  . Smoking status: Former Smoker    Types: Cigarettes    Last attempt to  quit: 11/02/2014    Years since quitting: 3.0  . Smokeless tobacco: Never Used  Substance and Sexual Activity  . Alcohol use: Not Currently    Alcohol/week: 0.0 oz  . Drug use: No  . Sexual activity: Not on file  Lifestyle  . Physical activity:    Days per week: Not on file    Minutes per session: Not on file  . Stress: Not on file  Relationships  . Social connections:    Talks on phone: Not on file    Gets together: Not on file    Attends religious service: Not on file    Active member of club or organization: Not on file    Attends meetings of clubs or organizations: Not on file    Relationship status: Not on file  . Intimate partner violence:    Fear of current or ex partner: Not on file    Emotionally abused: Not on file    Physically abused: Not on file    Forced sexual activity: Not on file  Other Topics Concern  . Not on file  Social History Narrative   Married   Retired   Scientist, physiological- PhD     PHYSICAL EXAM  GENERAL EXAM/CONSTITUTIONAL: Vitals:  Vitals:   11/01/17 1019  BP: (!) 107/59   Pulse: 64  Weight: 157 lb (71.2 kg)  Height: 5' 7"  (1.702 m)     Body mass index is 24.59 kg/m.  Visual Acuity Screening   Right eye Left eye Both eyes  Without correction:     With correction: 20/50 20/50 20/50      Patient is in no distress; well developed, nourished and groomed; neck is supple  CARDIOVASCULAR:  Examination of carotid arteries is normal; no carotid bruits  Regular rate and rhythm, no murmurs  Examination of peripheral vascular system by observation and palpation is normal  EYES:  Ophthalmoscopic exam of optic discs and posterior segments is normal; no papilledema or hemorrhages  MUSCULOSKELETAL:  Gait, strength, tone, movements noted in Neurologic exam below  NEUROLOGIC: MENTAL STATUS:  MMSE - Mini Mental State Exam 11/01/2017  Orientation to time 5  Orientation to Place 4  Registration 3  Attention/ Calculation 5  Recall 2  Language- name 2 objects 2  Language- repeat 0  Language- follow 3 step command 3  Language- read & follow direction 1  Write a sentence 1  Copy design 1  Total score 27    awake, alert, oriented to person, place and time  recent and remote memory intact; 2/3 IMMEDIATE RECALL  normal attention and concentration  language fluent, comprehension intact, naming intact,   fund of knowledge appropriate  ANIMAL FLUENCY TEST 8  CRANIAL NERVE:   2nd - no papilledema on fundoscopic exam  2nd, 3rd, 4th, 6th - pupils equal and reactive to light, visual fields full to confrontation, extraocular muscles intact, no nystagmus  5th - facial sensation symmetric  7th - facial strength symmetric  8th - hearing intact  9th - palate elevates symmetrically, uvula midline  11th - shoulder shrug symmetric  12th - tongue protrusion midline  MOTOR:   normal bulk and tone, full strength in the BUE, BLE; LIMITED IN LOWER EXT DUE TO PAIN  SENSORY:   normal and symmetric to light touch, temperature, vibration; DECR IN  FEET  COORDINATION:   finger-nose-finger, fine finger movements normal  REFLEXES:   deep tendon reflexes TRACE and symmetric  GAIT/STATION:   narrow based gait; LIMITED DUE TO PAIN  DIAGNOSTIC DATA (LABS, IMAGING, TESTING) - I reviewed patient records, labs, notes, testing and imaging myself where available.  Lab Results  Component Value Date   WBC 7.4 08/23/2017   HGB 12.2 (L) 08/23/2017   HCT 35.7 (L) 08/23/2017   MCV 95.2 08/23/2017   PLT 246 08/23/2017      Component Value Date/Time   NA 134 (L) 08/23/2017 0621   K 4.0 08/23/2017 0621   CL 105 08/23/2017 0621   CO2 22 08/23/2017 0621   GLUCOSE 100 (H) 08/23/2017 0621   BUN <5 (L) 08/23/2017 0621   CREATININE 0.79 08/23/2017 0621   CALCIUM 8.9 08/23/2017 0621   PROT 5.1 (L) 08/19/2017 0640   ALBUMIN 2.1 (L) 08/19/2017 0640   AST 49 (H) 08/19/2017 0640   ALT 21 08/19/2017 0640   ALKPHOS 60 08/19/2017 0640   BILITOT 1.7 (H) 08/19/2017 0640   GFRNONAA >60 08/23/2017 0621   GFRAA >60 08/23/2017 0621   No results found for: CHOL, HDL, LDLCALC, LDLDIRECT, TRIG, CHOLHDL Lab Results  Component Value Date   HGBA1C 6.2 (H) 08/13/2017   Lab Results  Component Value Date   FKCLEXNT70 017 08/13/2017   Lab Results  Component Value Date   TSH 2.228 08/13/2017    08/06/17 MRI brain [I reviewed images myself and agree with interpretation. Except there is moderate perisylvian and mild generalized atrophy as well. -VRP]  - Chronic ischemic microangiopathy without acute intracranial abnormality.    ASSESSMENT AND PLAN  77 y.o. year old male here with at least 1 to 2 years of mild short-term memory loss, confusion, frustration, typically late in the evening, with more significant confusion and encephalopathy in March 2019 in the setting of hyponatremia and pneumonia.  Patient gradually improving.  ADLs are slightly limited, mainly due to physical limitations.  Could represent mild cognitive impairment versus late  affects of alcohol use.  Other possibilities include prodromal or mild dementia.  More significant confusion in March 2019 was related to toxic metabolic encephalopathy.   Dx: MCI, toxic-metabolic encephalopathy, late effects of etoh  1. Memory loss      PLAN:  - monitor symptoms; focus on brain healthy activities, nutrition, exercise  - safety / supervision reviewed; caution with driving and finances  - may consider memantine in future  - consider repeat neuropsychology testing in 6-12 months  Return in about 6 months (around 05/03/2018).    Penni Bombard, MD 4/94/4967, 59:16 AM Certified in Neurology, Neurophysiology and Neuroimaging  [facility] [address], [place], [place] [address] [phone]

## 2017-11-01 NOTE — Patient Instructions (Signed)
Thank you for coming to see [place] at [facility]. I hope we have been able to provide you high quality care today.  You may receive a patient satisfaction survey over the next few weeks. We would appreciate your feedback and comments so that we may continue to improve ourselves and the health of our patients.    ~~~~~~~~~~~~~~~~~~~~~~~~~~~~~~~~~~~~~~~~~~~~~~~~~~~~~~~~~~~~~~~~~  DR. PENUMALLI'S GUIDE TO HAPPY AND HEALTHY LIVING These are some of my general health and wellness recommendations. Some of them may apply to you better than others. Please use common sense as you try these suggestions and feel free to ask me any questions.   ACTIVITY/FITNESS Mental, social, emotional and physical stimulation are very important for brain and body health. Try learning a new activity (arts, music, language, sports, games).  Keep moving your body to the best of your abilities. You can do this at home, inside or outside, the park, community center, gym or anywhere you like. Consider a physical therapist or personal trainer to get started. Fitness trackers, smart-watches or  smart-phones can help as well.   NUTRITION Eat more plants: colorful vegetables, nuts, seeds and berries.  Eat less sugar, salt, preservatives and processed foods.  Avoid toxins such as cigarettes and alcohol.  Drink water when you are thirsty. Warm water with a slice of lemon is an excellent morning drink to start the day.  Consider these websites for more information The Nutrition Source (https://www.henry-hernandez.biz/) [employer] Nutrition (WindowBlog.ch)   RELAXATION Consider practicing mindfulness meditation or other relaxation techniques such as deep breathing, prayer, yoga, tai chi, massage. See website mindful.org or the apps Headspace or Calm to help get started.   SLEEP Try to get at least 7-8+ hours sleep per day. Regular exercise and reduced caffeine  will help you sleep better. Practice good sleep hygeine techniques. See website sleep.org for more information.   PLANNING Prepare estate planning, living will, healthcare POA documents. Sometimes this is best planned with the help of an attorney. Theconversationproject.org and agingwithdignity.org are excellent resources.

## 2017-11-01 NOTE — Addendum Note (Signed)
Addended by: Alger Simons T on: 11/01/2017 09:22 AM   Modules accepted: Orders

## 2017-11-01 NOTE — Progress Notes (Signed)
Per doctors order, thiamine discontinued

## 2017-11-03 ENCOUNTER — Encounter

## 2017-11-03 ENCOUNTER — Ambulatory Visit: Payer: Medicare Other

## 2017-11-03 DIAGNOSIS — M6281 Muscle weakness (generalized): Secondary | ICD-10-CM

## 2017-11-03 DIAGNOSIS — R41841 Cognitive communication deficit: Secondary | ICD-10-CM | POA: Diagnosis not present

## 2017-11-03 DIAGNOSIS — R2681 Unsteadiness on feet: Secondary | ICD-10-CM

## 2017-11-03 DIAGNOSIS — R2689 Other abnormalities of gait and mobility: Secondary | ICD-10-CM

## 2017-11-03 NOTE — Therapy (Signed)
[facility] [address] [place], [place], [address] Phone: [phone]   Fax:  [phone]  Physical Therapy Treatment  Patient Details  Name: [name] MRN: [id number] Date of Birth: 1940/12/02 Referring Provider: Dr. Naaman Plummer   Encounter Date: 11/03/2017  PT End of Session - 11/03/17 0935    Visit Number  4    Number of Visits  17    Date for PT Re-Evaluation  12/12/17    Authorization Type  [employer] Medicare    PT Start Time  [phone] pt arrived late    PT Stop Time  0929    PT Time Calculation (min)  40 min    Equipment Utilized During Treatment  Gait belt    Activity Tolerance  Patient tolerated treatment well    Behavior During Therapy  [facility] for tasks assessed/performed       Past Medical History:  Diagnosis Date  . DM (diabetes mellitus) ([place])   . GERD (gastroesophageal reflux disease)   . Hyperlipidemia   . Hypertension   . Legionella pneumonia ([place])   . Osteoarthritis   . Osteopenia   . Rheumatoid arthritis ([place])   . Thyroid disease     Past Surgical History:  Procedure Laterality Date  . COLONOSCOPY    . KNEE SURGERY Bilateral    to clear out arthritis    There were no vitals filed for this visit.  Subjective Assessment - 11/03/17 0853    Subjective  Pt denied falls or changes since last visit. Pt became agitated when PT asked about MD visit or if he had any changes.     Pertinent History  A-fib, HTN, DM, hypothyroidism, RA, osteopenia, spinal stenosis (per pt), OA in B knees    Patient Stated Goals  Improve balance and walk safely, get back to driving    Currently in Pain?  Yes    Pain Score  8     Pain Location  Back    Pain Orientation  Lower;Right    Pain Descriptors / Indicators  Aching;Shooting    Pain Type  Chronic pain    Pain Onset  More than a month ago    Aggravating Factors   walking     Pain Relieving Factors  Tylenol                              Balance Exercises - 11/03/17 0855      Balance Exercises: Standing   Rockerboard  Anterior/posterior;Lateral;Head turns;EO;EC;10 seconds;30 seconds;5 reps;10 reps;Intermittent UE support    Other Standing Exercises  Pt performed in // bars with min A to min guard for safety. Cues and demo for technique. Seated rest break after rockerboard 2/2 LBP. Pt also performed single cone taps 5x4/LE with cues and demo for technique and to improve lateral weight shifting, min guard to min A for safety and to maintain balance. Pt required 2 seated rest breaks 2/2 LBP.          PT Short Term Goals - 10/13/17 1149      PT SHORT TERM GOAL #1   Title  Pt will be IND in HEP in order to improve strength, balance, and safety. TARGET DATE FOR ALL LTGS: 11/10/17    Status  New      PT SHORT TERM GOAL #2   Title  Pt will improve DGI score to >/=15/24 to decr. falls risk.  Baseline  11/24    Status  New      PT SHORT TERM GOAL #3   Title  Pt will improve gait speed with LRAD to >/=2.54f/sec. in order to safely amb. in the community.     Baseline  2.425fsec. with SPC    Status  New      PT SHORT TERM GOAL #4   Title  Pt will amb. 500' with LRAD at MOD I level over even terrain to improve safety during functional mobility.     Status  New        PT Long Term Goals - 10/13/17 1211      PT LONG TERM GOAL #1   Title  Pt will verbalize understanding of fall prevention strategies to reduce falls risk. TARGET DATE FOR ALL LTGS: 12/08/17    Status  New      PT LONG TERM GOAL #2   Title  Pt will amb. 700' over even/uneven terrain at MOD I level with LRAD to improve safety during functional mobility.     Status  New      PT LONG TERM GOAL #3   Title  Pt will improve DGI score to >/=20/24 to decr. falls risk.     Status  New      PT LONG TERM GOAL #4   Title  Pt will amb. 100' no AD, with S, to amb. safely at home.     Status  New             Plan - 11/03/17 0935    Clinical Impression Statement  Pt demonstrated progress, as he tolerated high level balance activities well. Pt reported LBP decr. from 8/10 to 6/10 at end of session. However, pt did require 3 seated rest breaks during session 2/2 LBP. Pt required incr. assist to maintain balance during activities which require incr. vestibular input. Continue with POC.     Rehab Potential  Good    Clinical Impairments Affecting Rehab Potential  see above.     PT Frequency  2x / week with potential to d/c early    PT Duration  8 weeks    PT Treatment/Interventions  ADLs/Self Care Home Management;Biofeedback;Canalith Repostioning;Electrical Stimulation;Therapeutic exercise;Therapeutic activities;Manual techniques;Vestibular;Orthotic Fit/Training;Functional mobility training;Stair training;Gait training;Patient/family education;DME Instruction;Neuromuscular re-education;Balance training    PT Next Visit Plan  Monitor BP 2/2 medical hx. continue to work on gait without cane, high level balance, pertubations. Fall risk education. Begin to assess STGs next session.     Consulted and Agree with Plan of Care  Patient       Patient will benefit from skilled therapeutic intervention in order to improve the following deficits and impairments:  Abnormal gait, Decreased endurance, Decreased knowledge of use of DME, Decreased strength, Decreased mobility, Decreased balance, Decreased cognition, Impaired flexibility, Postural dysfunction, Decreased safety awareness, Pain(PT will closely monitor pain but will not directly address)  Visit Diagnosis: Other abnormalities of gait and mobility  Unsteadiness on feet  Muscle weakness (generalized)     Problem List Patient Active Problem List   Diagnosis Date Noted  . Encephalopathy 08/18/2017  . Fever   . SIADH (syndrome of inappropriate ADH production) ([place]  . PAF (paroxysmal atrial fibrillation) ([place]  . Type 2 diabetes mellitus  with peripheral neuropathy (HCC)   . Hypokalemia   . Urinary retention   . Acute blood loss anemia   . Hypothyroidism   . Spinal stenosis 08/12/2017  . 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  Type 2 diabetes mellitus with other specified complication ([place]) 16/02/9603  . Hyponatremia 08/12/2017  . Atrial fibrillation ([place]) 01/16/2016  . Rheumatoid arthritis ([place]) 01/16/2016  . History of artificial joint 12/19/2014  . H/O knee surgery 12/19/2014  . Primary osteoarthritis of both knees 10/27/2014  . Osteopenia 03/31/2014  . HEARTBURN 09/04/2009    Elenora Hawbaker L 11/03/2017, 9:37 AM  [facility] [address] [place], [place], [address] Phone: [phone]   Fax:  [phone]  Name: ROTH RESS MRN: [id number] Date of Birth: June 17, 1940  Geoffry Paradise, PT,DPT 11/03/17 9:38 AM Phone: [phone] Fax: [phone]

## 2017-11-07 ENCOUNTER — Ambulatory Visit: Payer: Medicare Other

## 2017-11-07 VITALS — BP 114/60 | HR 68

## 2017-11-07 DIAGNOSIS — M6281 Muscle weakness (generalized): Secondary | ICD-10-CM

## 2017-11-07 DIAGNOSIS — R2689 Other abnormalities of gait and mobility: Secondary | ICD-10-CM

## 2017-11-07 DIAGNOSIS — R2681 Unsteadiness on feet: Secondary | ICD-10-CM

## 2017-11-07 DIAGNOSIS — R41841 Cognitive communication deficit: Secondary | ICD-10-CM | POA: Diagnosis not present

## 2017-11-07 NOTE — Therapy (Signed)
[facility] [address] [place], [place], [address] Phone: [phone]   Fax:  [phone]  Physical Therapy Treatment  Patient Details  Name: [name] MRN: [id number] Date of Birth: Jan 19, 1941 Referring Provider: Dr. Naaman Plummer   Encounter Date: 11/07/2017  PT End of Session - 11/07/17 1044    Visit Number  5    Number of Visits  17    Date for PT Re-Evaluation  12/12/17    Authorization Type  [employer] Medicare    PT Start Time  9233 pt arrived late    PT Stop Time  1013    PT Time Calculation (min)  38 min    Equipment Utilized During Treatment  Gait belt    Activity Tolerance  Patient tolerated treatment well;No increased pain    Behavior During Therapy  WFL for tasks assessed/performed       Past Medical History:  Diagnosis Date  . DM (diabetes mellitus) ([place])   . GERD (gastroesophageal reflux disease)   . Hyperlipidemia   . Hypertension   . Legionella pneumonia ([place])   . Osteoarthritis   . Osteopenia   . Rheumatoid arthritis ([place])   . Thyroid disease     Past Surgical History:  Procedure Laterality Date  . COLONOSCOPY    . KNEE SURGERY Bilateral    to clear out arthritis    Vitals:   11/07/17 0939 11/07/17 1002  BP: 106/64 114/60  Pulse: 67 68    Subjective Assessment - 11/07/17 0939    Subjective  Pt denied falls or changes since last visit. Pt smiling while amb. back to the gym today.     Patient is accompained by:  -- wife in lobby    Pertinent History  A-fib, HTN, DM, hypothyroidism, RA, osteopenia, spinal stenosis (per pt), OA in B knees    Patient Stated Goals  Improve balance and walk safely, get back to driving    Currently in Pain?  Yes    Pain Score  7     Pain Location  Back    Pain Orientation  Lower;Right    Pain Descriptors / Indicators  Aching    Pain Type  Chronic pain    Pain Onset  More than a month ago    Pain Frequency  Constant    Aggravating Factors   walking     Pain Relieving Factors  Tylenol and exercises that we performed last session.                        [facility] Adult PT Treatment/Exercise - 11/07/17 0944      Ambulation/Gait   Ambulation/Gait  Yes    Ambulation/Gait Assistance  5: Supervision;6: Modified independent (Device/Increase time)    Ambulation/Gait Assistance Details  S over paved surfaces outdoors. Cues for sequencing with SPC outdoors. MOD I over even, indoor terrain. Pt reported back pain is 5/10 after amb.     Ambulation Distance (Feet)  500 Feet x2 with SPC, 115' no AD    Assistive device  -- none    Gait Pattern  Step-through pattern;Decreased stride length;Decreased trunk rotation;Trendelenburg;Narrow base of support    Ambulation Surface  Level;Unlevel;Outdoor;Indoor;Paved    Gait velocity  2.50f/sec. with SPC and 1.88 ft/sec during first trial. Speed limited by LBP.      Standardized Balance Assessment   Standardized Balance Assessment  Dynamic Gait Index no AD      Dynamic Gait Index  Level Surface  Mild Impairment    Change in Gait Speed  Mild Impairment    Gait with Horizontal Head Turns  Mild Impairment    Gait with Vertical Head Turns  Mild Impairment    Gait and Pivot Turn  Moderate Impairment    Step Over Obstacle  Mild Impairment    Step Around Obstacles  Mild Impairment    Steps  Moderate Impairment    Total Score  14             PT Education - 11/07/17 1043    Education provided  Yes    Education Details  PT discussed goal progress and outcome measure results.     Person(s) Educated  Patient    Methods  Explanation    Comprehension  Verbalized understanding       PT Short Term Goals - 11/07/17 1054      PT SHORT TERM GOAL #1   Title  Pt will be IND in HEP in order to improve strength, balance, and safety. TARGET DATE FOR ALL LTGS: 11/10/17    Status  New      PT SHORT TERM GOAL #2   Title  Pt will improve DGI score to >/=15/24 to decr. falls risk.     Baseline  14/24  on 11/07/17    Status  Partially Met      PT SHORT TERM GOAL #3   Title  Pt will improve gait speed with LRAD to >/=2.22f/sec. in order to safely amb. in the community.     Baseline  2.020fsec. with SPC limited by LBP    Status  Not Met      PT SHORT TERM GOAL #4   Title  Pt will amb. 500' with LRAD at MOD I level over even terrain to improve safety during functional mobility.     Status  Achieved        PT Long Term Goals - 10/13/17 1211      PT LONG TERM GOAL #1   Title  Pt will verbalize understanding of fall prevention strategies to reduce falls risk. TARGET DATE FOR ALL LTGS: 12/08/17    Status  New      PT LONG TERM GOAL #2   Title  Pt will amb. 700' over even/uneven terrain at MOD I level with LRAD to improve safety during functional mobility.     Status  New      PT LONG TERM GOAL #3   Title  Pt will improve DGI score to >/=20/24 to decr. falls risk.     Status  New      PT LONG TERM GOAL #4   Title  Pt will amb. 100' no AD, with S, to amb. safely at home.     Status  New            Plan - 11/07/17 1044    Clinical Impression Statement  Pt demonstrated progress, as he met STG 4 and partially met STG 2. Pt did not meet STG 3, as he reported LBP limits his ability to amb. at faster speeds. PT will assess STG 1 (HEP) next session. Pt's DGI score continues to indicate pt is at a high risk for falls. Pt's gait speed indicates pt is not able to amb. safely in the community. Pt would continue to benefit from skilled PT to improve safety during functional mobility.     Rehab Potential  Good    Clinical Impairments Affecting Rehab Potential  see above.     PT Frequency  2x / week with potential to d/c early    PT Duration  8 weeks    PT Treatment/Interventions  ADLs/Self Care Home Management;Biofeedback;Canalith Repostioning;Electrical Stimulation;Therapeutic exercise;Therapeutic activities;Manual techniques;Vestibular;Orthotic Fit/Training;Functional mobility  training;Stair training;Gait training;Patient/family education;DME Instruction;Neuromuscular re-education;Balance training    PT Next Visit Plan  Fall risk education. Finish assessing STG 1 (HEP) next session. Monitor BP 2/2 medical hx. continue to work on gait without cane, high level balance, pertubations.     Consulted and Agree with Plan of Care  Patient       Patient will benefit from skilled therapeutic intervention in order to improve the following deficits and impairments:  Abnormal gait, Decreased endurance, Decreased knowledge of use of DME, Decreased strength, Decreased mobility, Decreased balance, Decreased cognition, Impaired flexibility, Postural dysfunction, Decreased safety awareness, Pain(PT will closely monitor pain but will not directly address)  Visit Diagnosis: Other abnormalities of gait and mobility  Unsteadiness on feet  Muscle weakness (generalized)     Problem List Patient Active Problem List   Diagnosis Date Noted  . Encephalopathy 08/18/2017  . Fever   . SIADH (syndrome of inappropriate ADH production) ([place])   . PAF (paroxysmal atrial fibrillation) ([place])   . Type 2 diabetes mellitus with peripheral neuropathy (H[facility])   . Hypokalemia   . Urinary retention   . Acute blood loss anemia   . Hypothyroidism   . Spinal stenosis 08/12/2017  . 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 Type 2 diabetes mellitus with other specified complication ([place]) 82/64/1583  . Hyponatremia 08/12/2017  . Atrial fibrillation ([place]) 01/16/2016  . Rheumatoid arthritis ([place]) 01/16/2016  . History of artificial joint 12/19/2014  . H/O knee surgery 12/19/2014  . Primary osteoarthritis of both knees 10/27/2014  . Osteopenia 03/31/2014  . HEARTBURN 09/04/2009    Lindee Leason L 11/07/2017, 10:55 AM  [facility] [address] [place], [place], [address] Phone: [phone]   Fax:  [phone]  Name: ANGELL HONSE MRN:  [id number] Date of Birth: [birth year]  Geoffry Paradise, PT,DPT 11/07/17 10:55 AM Phone: [phone] Fax: [phone]

## 2017-11-08 ENCOUNTER — Encounter: Payer: Self-pay | Admitting: Speech Pathology

## 2017-11-08 ENCOUNTER — Ambulatory Visit: Payer: Medicare Other | Admitting: Speech Pathology

## 2017-11-08 DIAGNOSIS — R41841 Cognitive communication deficit: Secondary | ICD-10-CM | POA: Diagnosis not present

## 2017-11-08 NOTE — Patient Instructions (Signed)
   Memory Strategies  W - Write it down  A - Associate it with something  R - Repeat it  M - Mental Image   Keep a calendar in a central location with your appointments, cross off each day as it is over  Keep a basket for keys, phone, glasses, remote in the same place every day  Use pill organizer and set timer to help you remember to take your medications   Play the memory game - can be on the computer  Try to remember 3-5 items on your store list without looking  Study a detailed picture in a magazine for 1 minute, then write down everything you can remember from the picture     Cognitive Activities you can do at home:   - Solitaire  - [place]  - Chess/Checkers  - Crosswords (easy level)  - [place] saw puzzle  On your computer, tablet or phone: Engineer, drilling Game [employer]

## 2017-11-08 NOTE — Therapy (Signed)
[name] [address] [place], [place], [address] Phone: [phone]   Fax:  [phone]  Speech Language Pathology Treatment  Patient Details  Name: [name] MRN: [id number] Date of Birth: 1940-07-22 Referring Provider: Dr. Naaman Kaufman   Encounter Date: 11/08/2017  End of Session - 11/08/17 1503    Visit Number  3    Number of Visits  5    Date for SLP Re-Evaluation  12/15/17    SLP Start Time  3382    SLP Stop Time   5053    SLP Time Calculation (min)  42 min    Activity Tolerance  Patient tolerated treatment well;No increased pain       Past Medical History:  Diagnosis Date  . DM (diabetes mellitus) ([place])   . GERD (gastroesophageal reflux disease)   . Hyperlipidemia   . Hypertension   . Legionella pneumonia ([place])   . Osteoarthritis   . Osteopenia   . Rheumatoid arthritis ([place])   . Thyroid disease     Past Surgical History:  Procedure Laterality Date  . COLONOSCOPY    . KNEE SURGERY Bilateral    to clear out arthritis    There were no vitals filed for this visit.  Subjective Assessment - 11/08/17 1407    Subjective  "You can read what he said"    Patient is accompained by:  Family member    Currently in Pain?  Yes    Pain Score  7     Pain Location  Back    Pain Orientation  Right;Lower    Pain Descriptors / Indicators  Aching    Pain Type  Chronic pain    Pain Onset  More than a month ago    Pain Frequency  Constant    Aggravating Factors   walking    Pain Relieving Factors  tylenol            ADULT SLP TREATMENT - 11/08/17 1411      General Information   Behavior/Cognition  Alert;Cooperative;Pleasant mood      Treatment Provided   Treatment provided  Cognitive-Linquistic      Cognitive-Linquistic Treatment   Treatment focused on  Cognition    Skilled Treatment  Ongoing training of memory strategies of mental image and relating words/pictures. Pt able to relate words  in 5 word list with min A and utilized story/mental picture with min A. Recall using strategies after 2 minutes 1/4. Also trained pt and spouse in memory activitites to do at home for visual and verbal memory. Pt to initiate responsibility for taking meds after am meal using medication organizer and min A from spouse or use of timer. Pt to organize pills with spouse's supervison/assistance. Pt is to be responsible to fill in appts on their calendar on the coffee table and check calendar in am and pm. Spouse and pt educated re: cognitive activities to do at home. Pt completed mildly complex math reasoning tasks today with usual min to mod A.      Assessment / Recommendations / Plan   Plan  Continue with current plan of care      Progression Toward Goals   Progression toward goals  Progressing toward goals       SLP Education - 11/08/17 1456    Education Details  Memory strategies for home, environmental compensations for memory, compensations for medication mangagement; cognitive activities to do at home    Person(s) Educated  Patient;Spouse  Methods  Explanation;Verbal cues;Demonstration;Handout    Comprehension  Verbalized understanding;Need further instruction;Verbal cues required         SLP Long Term Goals - 11/08/17 1502      SLP LONG TERM GOAL #1   Title  Pt will utilize external aids for medication, schedule management/functional recall with occasional min A from family outside of therapy over 2 sessions    Time  3    Period  Weeks    Status  On-going      SLP LONG TERM GOAL #2   Title  Pt will tell SLP/indicate 4 memory strategies to improve memory over two sessions with modified independence    Time  3    Period  Weeks    Status  On-going       Plan - 11/08/17 1458    Clinical Impression Statement  Pt and spouse trained in compensations for memory with demonstrations with min to mod A. Pt to begin using strategy/timer to recall meds after am meal. Pt continues to  demonstrate cognitive impairments, continue skilled ST to reduce caregiver burden and maximize independence and safety.    Speech Therapy Frequency  1x /week    Duration  4 weeks or total of 5 visits    Treatment/Interventions  Compensatory strategies;Patient/family education;SLP instruction and feedback;Functional tasks;Internal/external aids;Cognitive reorganization    Potential to Achieve Goals  Fair    Potential Considerations  Severity of impairments;Ability to learn/carryover information;Cooperation/participation level    SLP Home Exercise Plan  WARM strategies provided and cognitive activities for home, pt to obtain weekly planner or memory system    Consulted and Agree with Plan of Care  Patient;Family member/caregiver    Family Member Consulted  wife [name]       Patient will benefit from skilled therapeutic intervention in order to improve the following deficits and impairments:   Cognitive communication deficit    Problem List Patient Active Problem List   Diagnosis Date Noted  . Encephalopathy 08/18/2017  . Fever   . SIADH (syndrome of inappropriate ADH production) ([place])   . PAF (paroxysmal atrial fibrillation) ([place])   . Type 2 diabetes mellitus with peripheral neuropathy (H[facility])   . Hypokalemia   . Urinary retention   . Acute blood loss anemia   . Hypothyroidism   . Spinal stenosis 08/12/2017  . 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 Type 2 diabetes mellitus with other specified complication ([place]) 75/44/9201  . Hyponatremia 08/12/2017  . Atrial fibrillation ([place]) 01/16/2016  . Rheumatoid arthritis ([place]) 01/16/2016  . History of artificial joint 12/19/2014  . H/O knee surgery 12/19/2014  . Primary osteoarthritis of both knees 10/27/2014  . Osteopenia 03/31/2014  . HEARTBURN 09/04/2009    [name], Annye Rusk MS, CCC-SLP 11/08/2017, 3:03 PM  [facility] [address] [place], [place], [address] Phone: [phone]    Fax:  [phone]   Name: [name] MRN: [id number] Date of Birth: 17-Feb-1941

## 2017-11-09 ENCOUNTER — Ambulatory Visit
Admission: RE | Admit: 2017-11-09 | Discharge: 2017-11-09 | Disposition: A | Discharge status: Home / Self Care | Payer: Medicare Other | Source: Ambulatory Visit | Attending: Rheumatology | Admitting: Rheumatology

## 2017-11-09 DIAGNOSIS — G8929 Other chronic pain: Secondary | ICD-10-CM

## 2017-11-09 DIAGNOSIS — M545 Low back pain: Principal | ICD-10-CM

## 2017-11-09 MED ORDER — METHYLPREDNISOLONE ACETATE 40 MG/ML INJ SUSP (RADIOLOG
120.0000 mg | Freq: Once | INTRAMUSCULAR | Status: AC
  Administered 2017-11-09: 120 mg via EPIDURAL

## 2017-11-09 MED ORDER — IOPAMIDOL (ISOVUE-M 200) INJECTION 41%
1.0000 mL | Freq: Once | INTRAMUSCULAR | Status: AC
  Administered 2017-11-09: 1 mL via EPIDURAL

## 2017-11-09 NOTE — Discharge Instructions (Signed)

## 2017-11-10 ENCOUNTER — Ambulatory Visit: Payer: Medicare Other | Admitting: Rehabilitation

## 2017-11-15 ENCOUNTER — Encounter: Payer: Medicare Other | Admitting: Speech Pathology

## 2017-11-17 ENCOUNTER — Ambulatory Visit: Payer: Medicare Other

## 2017-11-17 DIAGNOSIS — R2681 Unsteadiness on feet: Secondary | ICD-10-CM

## 2017-11-17 DIAGNOSIS — R2689 Other abnormalities of gait and mobility: Secondary | ICD-10-CM

## 2017-11-17 DIAGNOSIS — M6281 Muscle weakness (generalized): Secondary | ICD-10-CM

## 2017-11-17 DIAGNOSIS — R41841 Cognitive communication deficit: Secondary | ICD-10-CM | POA: Diagnosis not present

## 2017-11-17 NOTE — Therapy (Signed)
[facility] [address] [place], [place], [address] Phone: [phone]   Fax:  [phone]  Physical Therapy Treatment  Patient Details  Name: [name] MRN: [id number] Date of Birth: 10-Feb-1941 Referring Provider: Dr. Naaman Plummer   Encounter Date: 11/17/2017  PT End of Session - 11/17/17 0928    Visit Number  6    Number of Visits  17    Date for PT Re-Evaluation  12/12/17    Authorization Type  [employer] Medicare    PT Start Time  0849    PT Stop Time  0927    PT Time Calculation (min)  38 min    Equipment Utilized During Treatment  -- S prn    Activity Tolerance  Patient tolerated treatment well;No increased pain    Behavior During Therapy  WFL for tasks assessed/performed       Past Medical History:  Diagnosis Date  . DM (diabetes mellitus) ([place])   . GERD (gastroesophageal reflux disease)   . Hyperlipidemia   . Hypertension   . Legionella pneumonia ([place])   . Osteoarthritis   . Osteopenia   . Rheumatoid arthritis ([place])   . Thyroid disease     Past Surgical History:  Procedure Laterality Date  . COLONOSCOPY    . KNEE SURGERY Bilateral    to clear out arthritis    There were no vitals filed for this visit.  Subjective Assessment - 11/17/17 0851    Subjective  Pt denied falls or changes since last visit. Pt stated he's not sure if exercises are working. Pt reported he had an injection in his back last but he hasn't noticed a difference.     Pertinent History  A-fib, HTN, DM, hypothyroidism, RA, osteopenia, spinal stenosis (per pt), OA in B knees    Patient Stated Goals  Improve balance and walk safely, get back to driving    Currently in Pain?  Yes    Pain Score  7     Pain Location  Back    Pain Orientation  Right;Lower    Pain Descriptors / Indicators  Aching    Pain Type  Chronic pain    Pain Onset  More than a month ago    Pain Frequency  Constant    Aggravating Factors   walking    Pain  Relieving Factors  Tylenol                            Balance Exercises - 11/17/17 0856      OTAGO PROGRAM   Head Movements  Sitting;5 reps    Neck Movements  Sitting;5 reps    Back Extension  -- n/a    Trunk Movements  -- n/a    Ankle Movements  Sitting;10 reps    Knee Extensor  10 reps;Weight (comment) 4 lbs/    Knee Flexor  10 reps    Hip ABductor  10 reps    Ankle Plantorflexors  20 reps, support    Ankle Dorsiflexors  20 reps, support    Knee Bends  10 reps, support    Backwards Walking  Support    Sideways Walking  Assistive device    Tandem Stance  10 seconds, support    Tandem Walk  Support    One Leg Stand  10 seconds, support    Heel Walking  Support    Toe Walk  Support    Sit to  Stand  10 reps, bilateral support    Stair Walking  x8 steps forwards and backwards with B UE support.     Overall OTAGO Comments  Cues and demo for technique.         PT Education - 11/17/17 [phone]    Education provided  Yes    Education Details  PT reviewed goal progress again and provided cues and demo for [place].    Person(s) Educated  Patient    Methods  Explanation;Demonstration;Verbal cues;Handout    Comprehension  Returned demonstration;Verbalized understanding       PT Short Term Goals - 11/17/17 0930      PT SHORT TERM GOAL #1   Title  Pt will be IND in HEP in order to improve strength, balance, and safety. TARGET DATE FOR ALL LTGS: 11/10/17    Baseline  required cues and demo on 11/17/17    Status  Partially Met      PT SHORT TERM GOAL #2   Title  Pt will improve DGI score to >/=15/24 to decr. falls risk.     Baseline  14/24 on 11/07/17    Status  Partially Met      PT SHORT TERM GOAL #3   Title  Pt will improve gait speed with LRAD to >/=2.44f/sec. in order to safely amb. in the community.     Baseline  2.047fsec. with SPC limited by LBP    Status  Not Met      PT SHORT TERM GOAL #4   Title  Pt will amb. 500' with LRAD at MOD I level over even  terrain to improve safety during functional mobility.     Status  Achieved        PT Long Term Goals - 10/13/17 1211      PT LONG TERM GOAL #1   Title  Pt will verbalize understanding of fall prevention strategies to reduce falls risk. TARGET DATE FOR ALL LTGS: 12/08/17    Status  New      PT LONG TERM GOAL #2   Title  Pt will amb. 700' over even/uneven terrain at MOD I level with LRAD to improve safety during functional mobility.     Status  New      PT LONG TERM GOAL #3   Title  Pt will improve DGI score to >/=20/24 to decr. falls risk.     Status  New      PT LONG TERM GOAL #4   Title  Pt will amb. 100' no AD, with S, to amb. safely at home.     Status  New            Plan - 11/17/17 [id number]  Clinical Impression Statement  Pt demonstrated improved strength, as he was able to tolerate adding ankle weights during LAQs. Pt also reported 0/10 pain at end of session. Pt continues to require cues and demo for technique, likely 2/2 cognitive impairments. Pt noted to experience LOB during walking out to check out, but self corrected with SPC and stepping strategy. Pt would continue to benefit from skilled PT to improve safety during functional mobility.    Rehab Potential  Good    Clinical Impairments Affecting Rehab Potential  see above.     PT Frequency  2x / week with potential to d/c early    PT Duration  8 weeks    PT Treatment/Interventions  ADLs/Self Care Home Management;Biofeedback;Canalith Repostioning;Electrical Stimulation;Therapeutic exercise;Therapeutic activities;Manual techniques;Vestibular;Orthotic Fit/Training;Functional mobility training;Stair  training;Gait training;Patient/family education;DME Instruction;Neuromuscular re-education;Balance training    PT Next Visit Plan  Fall risk education. Monitor BP 2/2 medical hx. continue to work on gait without cane, high level balance, pertubations.     Consulted and Agree with Plan of Care  Patient       Patient will  benefit from skilled therapeutic intervention in order to improve the following deficits and impairments:  Abnormal gait, Decreased endurance, Decreased knowledge of use of DME, Decreased strength, Decreased mobility, Decreased balance, Decreased cognition, Impaired flexibility, Postural dysfunction, Decreased safety awareness, Pain(PT will closely monitor pain but will not directly address)  Visit Diagnosis: Other abnormalities of gait and mobility  Unsteadiness on feet  Muscle weakness (generalized)     Problem List Patient Active Problem List   Diagnosis Date Noted  . Encephalopathy 08/18/2017  . Fever   . SIADH (syndrome of inappropriate ADH production) ([place])   . PAF (paroxysmal atrial fibrillation) ([place])   . Type 2 diabetes mellitus with peripheral neuropathy ([facility])   . Hypokalemia   . Urinary retention   . Acute blood loss anemia   . Hypothyroidism   . Spinal stenosis 08/12/2017  . 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 Type 2 diabetes mellitus with other specified complication ([place]) 58/52/7782  . Hyponatremia 08/12/2017  . Atrial fibrillation ([place]) 01/16/2016  . Rheumatoid arthritis ([place]) 01/16/2016  . History of artificial joint 12/19/2014  . H/O knee surgery 12/19/2014  . Primary osteoarthritis of both knees 10/27/2014  . Osteopenia 03/31/2014  . HEARTBURN 09/04/2009    Valaria Kohut L 11/17/2017, 9:30 AM  [facility] [address] [place], [place], [address] Phone: [phone]   Fax:  [phone]  Name: [name] MRN: [id number] Date of Birth: [birth year]  Geoffry Paradise, PT,DPT 11/17/17 9:30 AM Phone: [phone] Fax: [phone]

## 2017-11-21 ENCOUNTER — Ambulatory Visit: Payer: Medicare Other | Attending: Physical Medicine & Rehabilitation | Admitting: Speech Pathology

## 2017-11-21 ENCOUNTER — Encounter: Payer: Self-pay | Admitting: Speech Pathology

## 2017-11-21 DIAGNOSIS — R41841 Cognitive communication deficit: Secondary | ICD-10-CM | POA: Insufficient documentation

## 2017-11-21 DIAGNOSIS — R2681 Unsteadiness on feet: Secondary | ICD-10-CM | POA: Diagnosis present

## 2017-11-21 DIAGNOSIS — R2689 Other abnormalities of gait and mobility: Secondary | ICD-10-CM | POA: Diagnosis present

## 2017-11-21 DIAGNOSIS — M6281 Muscle weakness (generalized): Secondary | ICD-10-CM | POA: Diagnosis present

## 2017-11-21 NOTE — Therapy (Signed)
[facility] [address] [place], [place], [address] Phone: [phone]   Fax:  [phone]  Speech Language Pathology Treatment  Patient Details  Name: [name] MRN: [id number] Date of Birth: December 27, 1940 Referring Provider: Dr. Naaman Plummer   Encounter Date: 11/21/2017  End of Session - 11/21/17 1147    Visit Number  4    Number of Visits  5    Date for SLP Re-Evaluation  12/15/17    SLP Start Time  0932    SLP Stop Time   1013    SLP Time Calculation (min)  41 min    Activity Tolerance  Patient tolerated treatment well;No increased pain       Past Medical History:  Diagnosis Date  . DM (diabetes mellitus) ([place])   . GERD (gastroesophageal reflux disease)   . Hyperlipidemia   . Hypertension   . Legionella pneumonia ([place])   . Osteoarthritis   . Osteopenia   . Rheumatoid arthritis ([place])   . Thyroid disease     Past Surgical History:  Procedure Laterality Date  . COLONOSCOPY    . KNEE SURGERY Bilateral    to clear out arthritis    There were no vitals filed for this visit.  Subjective Assessment - 11/21/17 0943    Subjective  "My brain can not get better - this is not going to help"    Currently in Pain?  Yes    Pain Score  7     Pain Orientation  Lower lower right    Pain Descriptors / Indicators  Aching    Pain Type  Chronic pain    Pain Frequency  Constant    Aggravating Factors   walking    Pain Relieving Factors  tylenol            ADULT SLP TREATMENT - 11/21/17 0946      General Information   Behavior/Cognition  Alert;Cooperative;Pleasant mood      Treatment Provided   Treatment provided  Cognitive-Linquistic      Cognitive-Linquistic Treatment   Treatment focused on  Cognition    Skilled Treatment  Working memory and Development worker, international aid initially with visual cues and mod A visual and verbal cues, however this improved to rare min A as task progressed. Pt denies losing  or forgetting items at home. Working memory targeted in Clinical cytogeneticist 4-5 letter words alternating with written naming task - Pt required occasional min A for error awareness and working memory (forgetting a letter) and occasional min A to return to written task 3/7x.       Assessment / Recommendations / Plan   Plan  Continue with current plan of care goals remain appropriate for pt      Progression Toward Goals   Progression toward goals  -- goals remain appropriate for pt           SLP Long Term Goals - 11/21/17 1146      SLP LONG TERM GOAL #1   Title  Pt will utilize external aids for medication, schedule management/functional recall with occasional min A from family outside of therapy over 2 sessions    Time  2    Period  Weeks    Status  On-going      SLP LONG TERM GOAL #2   Title  Pt will tell SLP/indicate 4 memory strategies to improve memory over two sessions with modified independence    Time  2    Period  Weeks    Status  On-going       Plan - 11/21/17 1144    Clinical Impression Statement  Spouse not present for ST today. Pt reports inconsistent follow up with memory activities and compensations. Reviewed memory compensations with pt and targeted working memory and attention in [place] with min to mod A. [place] skilled ST 1-2 more visits for carryover of compensations for memory to reduce caregiver burden and maximize pt safety.    Speech Therapy Frequency  1x /week    Duration  -- 4 weeks or total of 5 visits    Treatment/Interventions  Compensatory strategies;Patient/family education;SLP instruction and feedback;Functional tasks;Internal/external aids;Cognitive reorganization    Potential to Achieve Goals  Fair    Potential Considerations  Severity of impairments;Ability to learn/carryover information;Cooperation/participation level       Patient will benefit from skilled therapeutic intervention in order to improve the following deficits and impairments:    Cognitive communication deficit    Problem List Patient Active Problem List   Diagnosis Date Noted  . Encephalopathy 08/18/2017  . Fever   . SIADH (syndrome of inappropriate ADH production) ([place])   . PAF (paroxysmal atrial fibrillation) ([place])   . Type 2 diabetes mellitus with peripheral neuropathy ([facility])   . Hypokalemia   . Urinary retention   . Acute blood loss anemia   . Hypothyroidism   . Spinal stenosis 08/12/2017  . 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 Type 2 diabetes mellitus with other specified complication ([place]) 64/15/8309  . Hyponatremia 08/12/2017  . Atrial fibrillation ([place]) 01/16/2016  . Rheumatoid arthritis ([place]) 01/16/2016  . History of artificial joint 12/19/2014  . H/O knee surgery 12/19/2014  . Primary osteoarthritis of both knees 10/27/2014  . Osteopenia 03/31/2014  . HEARTBURN 09/04/2009    Gwendalyn Mcgonagle, Annye Rusk MS, CCC-SLP 11/21/2017, 11:47 AM  [facility] [address] [place], [place], [address] Phone: [phone]   Fax:  [phone]   Name: [name] MRN: [id number] Date of Birth: 04-Aug-1940

## 2017-11-27 ENCOUNTER — Encounter: Payer: Self-pay | Admitting: Rehabilitation

## 2017-11-27 ENCOUNTER — Ambulatory Visit: Payer: Medicare Other | Admitting: Rehabilitation

## 2017-11-27 DIAGNOSIS — M6281 Muscle weakness (generalized): Secondary | ICD-10-CM

## 2017-11-27 DIAGNOSIS — R2689 Other abnormalities of gait and mobility: Secondary | ICD-10-CM

## 2017-11-27 DIAGNOSIS — R2681 Unsteadiness on feet: Secondary | ICD-10-CM

## 2017-11-27 DIAGNOSIS — R41841 Cognitive communication deficit: Secondary | ICD-10-CM | POA: Diagnosis not present

## 2017-11-27 NOTE — Therapy (Signed)
[facility] [address] [place], [place], [address] Phone: [phone]   Fax:  [phone]  Physical Therapy Treatment  Patient Details  Name: [name] MRN: [id number] Date of Birth: [birth year] Referring Provider: Dr. Naaman Plummer   Encounter Date: 11/27/2017  PT End of Session - 11/27/17 1122    Visit Number  7    Number of Visits  17    Date for PT Re-Evaluation  12/12/17    Authorization Type  [employer] Medicare    PT Start Time  0932    PT Stop Time  1015    PT Time Calculation (min)  43 min    Equipment Utilized During Treatment  -- S prn    Activity Tolerance  Patient tolerated treatment well;No increased pain    Behavior During Therapy  WFL for tasks assessed/performed       Past Medical History:  Diagnosis Date  . DM (diabetes mellitus) ([place])   . GERD (gastroesophageal reflux disease)   . Hyperlipidemia   . Hypertension   . Legionella pneumonia ([place])   . Osteoarthritis   . Osteopenia   . Rheumatoid arthritis ([place])   . Thyroid disease     Past Surgical History:  Procedure Laterality Date  . COLONOSCOPY    . KNEE SURGERY Bilateral    to clear out arthritis    There were no vitals filed for this visit.  Subjective Assessment - 11/27/17 0934    Subjective  Pt reports no falls and no other changes since last session.     Pertinent History  A-fib, HTN, DM, hypothyroidism, RA, osteopenia, spinal stenosis (per pt), OA in B knees    Patient Stated Goals  Improve balance and walk safely, get back to driving    Currently in Pain?  Yes    Pain Score  6     Pain Location  Back    Pain Orientation  Lower    Pain Descriptors / Indicators  Aching    Pain Onset  More than a month ago    Pain Frequency  Constant    Aggravating Factors   walking    Pain Relieving Factors  tylenol                       [facility] Adult PT Treatment/Exercise - 11/27/17 0948      Ambulation/Gait    Ambulation/Gait  Yes    Ambulation/Gait Assistance  5: Supervision;4: Min guard    Ambulation/Gait Assistance Details  Continue to address safety and quality of gait with SPC during session. Provided light facilitation for increased gait speed with cues for more narrowed BOS as he tends to keep wide BOS with LLE abducted from body.  Cues for upright posture and more pronounced placement of cane.  Pt with intermittent mild LOB during session, esp when making turns.  cues for improved stepping sequence, but feel he would continue to benefit from gait training around turns.      Ambulation Distance (Feet)  250 Feet    Assistive device  Straight cane    Gait Pattern  Step-through pattern;Decreased stride length;Decreased trunk rotation;Trendelenburg;Narrow base of support    Ambulation Surface  Level;Indoor    Stairs  Yes    Stairs Assistance  5: Supervision;4: Min guard    Stairs Assistance Details (indicate cue type and reason)  Addressed stairs during session for improved forward weight shift as he tends to "pull" himself up stairs with  UEs and note pain when descending due to poor UE placement.      Stair Management Technique  Two rails;Alternating pattern;Forwards    Number of Stairs  12    Height of Stairs  6      Neuro Re-ed    Neuro Re-ed Details   High level balance at counter top to address hip and stepping strategy and also standing on compliant surface for multi sensory balance challenge; standing on foam balance beam perpendicularly maintaining balance x 2 sets of 20 secs progressing to stepping down to floor and back to beam x 10 reps each leg progressing to tapping cones alternating LEs x 10 reps each both with light UE support (PT providing encouragement to decrease UE support and allow PT to facilitate improved weight shift).  Continued on foam balance beam holding tandem stance x 3 reps of 15 secs on each side progressing to side stepping (feet placed perpendicularly to beam) x 3 reps  down and back with light UE support.  Transitioned to corner balance on foam airex with feet apart EO x 20 secs>feet apart EO with head motion up/down and side/side x 10 reps each without UE support (intermittent min/guard needed from PT)>feet apart EC x 2 sets of 20 secs with intermittent UE support needed and finally feet together EO maintaining balance x 2 sets of 15 secs. Ended with SLS on blue airex advancing opposite LE from floor to second 6" step with single UE support x 10 reps progressing to no UE support x 5 reps. Pt VERY anxious about not using support, however educated that PT wanting to assist facilitate improved weight shift as he tends to rely on arms for most standing task and hip does not translate over hip entirely.               PT Education - 11/27/17 1122    Education provided  Yes    Education Details  education on safety and technique with stairs.     Person(s) Educated  Patient    Methods  Explanation    Comprehension  Verbalized understanding       PT Short Term Goals - 11/17/17 0930      PT SHORT TERM GOAL #1   Title  Pt will be IND in HEP in order to improve strength, balance, and safety. TARGET DATE FOR ALL LTGS: 11/10/17    Baseline  required cues and demo on 11/17/17    Status  Partially Met      PT SHORT TERM GOAL #2   Title  Pt will improve DGI score to >/=15/24 to decr. falls risk.     Baseline  14/24 on 11/07/17    Status  Partially Met      PT SHORT TERM GOAL #3   Title  Pt will improve gait speed with LRAD to >/=2.79f/sec. in order to safely amb. in the community.     Baseline  2.076fsec. with SPC limited by LBP    Status  Not Met      PT SHORT TERM GOAL #4   Title  Pt will amb. 500' with LRAD at MOD I level over even terrain to improve safety during functional mobility.     Status  Achieved        PT Long Term Goals - 10/13/17 1211      PT LONG TERM GOAL #1   Title  Pt will verbalize understanding of fall prevention strategies to  reduce falls risk. TARGET  DATE FOR ALL LTGS: 12/08/17    Status  New      PT LONG TERM GOAL #2   Title  Pt will amb. 700' over even/uneven terrain at MOD I level with LRAD to improve safety during functional mobility.     Status  New      PT LONG TERM GOAL #3   Title  Pt will improve DGI score to >/=20/24 to decr. falls risk.     Status  New      PT LONG TERM GOAL #4   Title  Pt will amb. 100' no AD, with S, to amb. safely at home.     Status  New            Plan - 11/27/17 1122    Clinical Impression Statement  Skilled session continues to focus on high level balance with multi-sensory challenges, elicitation of ankle/hip and stepping strategy along with improved forward weight shift during mobility tasks.  Pt continues to be limited due to cognitive deficits.      Rehab Potential  Good    Clinical Impairments Affecting Rehab Potential  see above.     PT Frequency  2x / week with potential to d/c early    PT Duration  8 weeks    PT Treatment/Interventions  ADLs/Self Care Home Management;Biofeedback;Canalith Repostioning;Electrical Stimulation;Therapeutic exercise;Therapeutic activities;Manual techniques;Vestibular;Orthotic Fit/Training;Functional mobility training;Stair training;Gait training;Patient/family education;DME Instruction;Neuromuscular re-education;Balance training    PT Next Visit Plan  Fall risk education. Monitor BP 2/2 medical hx. continue to work on gait without cane, high level balance, pertubations.     Consulted and Agree with Plan of Care  Patient       Patient will benefit from skilled therapeutic intervention in order to improve the following deficits and impairments:  Abnormal gait, Decreased endurance, Decreased knowledge of use of DME, Decreased strength, Decreased mobility, Decreased balance, Decreased cognition, Impaired flexibility, Postural dysfunction, Decreased safety awareness, Pain(PT will closely monitor pain but will not directly address)  Visit  Diagnosis: Other abnormalities of gait and mobility  Unsteadiness on feet  Muscle weakness (generalized)     Problem List Patient Active Problem List   Diagnosis Date Noted  . Encephalopathy 08/18/2017  . Fever   . SIADH (syndrome of inappropriate ADH production) ([place])   . PAF (paroxysmal atrial fibrillation) ([place])   . Type 2 diabetes mellitus with peripheral neuropathy ([facility])   . Hypokalemia   . Urinary retention   . Acute blood loss anemia   . Hypothyroidism   . Spinal stenosis 08/12/2017  . 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 Type 2 diabetes mellitus with other specified complication ([place]) 81/44/8185  . Hyponatremia 08/12/2017  . Atrial fibrillation ([place]) 01/16/2016  . Rheumatoid arthritis ([place]) 01/16/2016  . History of artificial joint 12/19/2014  . H/O knee surgery 12/19/2014  . Primary osteoarthritis of both knees 10/27/2014  . Osteopenia 03/31/2014  . HEARTBURN 09/04/2009    Cameron Sprang, PT, MPT [facility] [address] [place], [place], [address] Phone: [phone]   Fax:  [phone] 11/27/17, 11:24 AM  Name: [name] MRN: [id number] Date of Birth: 06-26-1940

## 2017-11-29 ENCOUNTER — Encounter: Payer: Self-pay | Admitting: Speech Pathology

## 2017-11-29 ENCOUNTER — Ambulatory Visit: Payer: Medicare Other | Admitting: Speech Pathology

## 2017-11-29 DIAGNOSIS — R41841 Cognitive communication deficit: Secondary | ICD-10-CM

## 2017-11-29 NOTE — Therapy (Signed)
[facility] [address] [place], [place], [address] Phone: [phone]   Fax:  [phone]  Speech Language Pathology Treatment  Patient Details  Name: [name] MRN: [id number] Date of Birth: 08-08-1940 Referring Provider: Dr. Naaman Plummer   Encounter Date: 11/29/2017  End of Session - 11/29/17 1343    Visit Number  5    Number of Visits  5    Date for SLP Re-Evaluation  12/15/17    SLP Start Time  0933    SLP Stop Time   8527    SLP Time Calculation (min)  41 min    Activity Tolerance  Patient tolerated treatment well;No increased pain       Past Medical History:  Diagnosis Date  . DM (diabetes mellitus) ([facility])   . GERD (gastroesophageal reflux disease)   . Hyperlipidemia   . Hypertension   . Legionella pneumonia ([facility])   . Osteoarthritis   . Osteopenia   . Rheumatoid arthritis ([facility])   . Thyroid disease     Past Surgical History:  Procedure Laterality Date  . COLONOSCOPY    . KNEE SURGERY Bilateral    to clear out arthritis    There were no vitals filed for this visit.  Subjective Assessment - 11/29/17 1334    Subjective  "I don't think it will help" re: working with his wife on his memory at home    Patient is accompained by:  Family member    Currently in Pain?  Yes    Pain Score  6     Pain Location  Back    Pain Orientation  Lower    Pain Descriptors / Indicators  Aching    Pain Type  Chronic pain    Pain Onset  More than a month ago    Pain Frequency  Constant    Aggravating Factors   walking    Pain Relieving Factors  tylenol            ADULT SLP TREATMENT - 11/29/17 0001      General Information   Behavior/Cognition  Alert;Cooperative;Pleasant mood      Treatment Provided   Treatment provided  Cognitive-Linquistic      Cognitive-Linquistic Treatment   Treatment focused on  Cognition    Skilled Treatment  Trained spouse and pt in external aid to recall where keys are  as pt reports losing keys frequently. See pt intstructions. Targeted working memory recalling rules of card sort, counting money amounts aloud required frequent repetition of amounts I gave him, pariticularly the coin amount. Alternating attention and working memory facilitated in written category/word finding task and auditory/very alphabetizing task with extended time and usual verbal cues for alphabetizing and error awareness. Spouse and pt trained in memory strategies and activities to carryover at home if pt will participate.       Assessment / Recommendations / Plan   Plan  Continue with current plan of care      Progression Toward Goals   Progression toward goals  -- d/c pt, last session       SLP Education - 11/29/17 1340    Education Details  memory aids to keep track of keys, memory strategies and activities to carryover at home    Person(s) Educated  Patient;Spouse    Methods  Explanation;Handout;Demonstration    Comprehension  Verbalized understanding;Verbal cues required        SPEECH THERAPY DISCHARGE SUMMARY  Visits from Start of Care: 5  Current functional level related to goals / functional outcomes: See goals below   Remaining deficits: Cognition - memory, attention, awareness  Education / Equipment: Compensations for memory impairment, cognitive activities for home Plan: Patient agrees to discharge.  Patient goals were not met. Patient is being discharged due to lack of progress.  ?????          SLP Long Term Goals - 11/29/17 1342      SLP LONG TERM GOAL #1   Title  Pt will utilize external aids for medication, schedule management/functional recall with occasional min A from family outside of therapy over 2 sessions    Time  1    Period  Weeks    Status  Not Met      SLP LONG TERM GOAL #2   Title  Pt will tell SLP/indicate 4 memory strategies to improve memory over two sessions with modified independence    Time  2    Period  Weeks    Status  Not  Met       Plan - 11/29/17 1340    Clinical Impression Statement  Spouse attended ST today - pt has not completed ST homework. Trained pt and spouse in memory compensations for the home. Pt has not been completing memory activities or strategies at home as he doesn't "think they will help" D/C ST at this time, pt in agreement    Speech Therapy Frequency  1x /week    Duration  -- 4 weeks, total of 5 visits    Treatment/Interventions  Compensatory strategies;Patient/family education;SLP instruction and feedback;Functional tasks;Internal/external aids;Cognitive reorganization    Potential to Achieve Goals  Fair    Potential Considerations  Severity of impairments;Ability to learn/carryover information;Cooperation/participation level    Consulted and Agree with Plan of Care  Patient;Family member/caregiver    Family Member Consulted  wife [name]       Patient will benefit from skilled therapeutic intervention in order to improve the following deficits and impairments:   Cognitive communication deficit    Problem List Patient Active Problem List   Diagnosis Date Noted  . Encephalopathy 08/18/2017  . Fever   . SIADH (syndrome of inappropriate ADH production) ([facility])   . PAF (paroxysmal atrial fibrillation) ([facility])   . Type 2 diabetes mellitus with peripheral neuropathy (HCC)   . Hypokalemia   . Urinary retention   . Acute blood loss anemia   . Hypothyroidism   . Spinal stenosis 08/12/2017  . 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 Type 2 diabetes mellitus with other specified complication ([facility]) 95/97/4718  . Hyponatremia 08/12/2017  . Atrial fibrillation ([facility]) 01/16/2016  . Rheumatoid arthritis ([facility]) 01/16/2016  . History of artificial joint 12/19/2014  . H/O knee surgery 12/19/2014  . Primary osteoarthritis of both knees 10/27/2014  . Osteopenia 03/31/2014  . HEARTBURN 09/04/2009    Darnetta Kesselman, Annye Rusk MS, CCC-SLP 11/29/2017, 1:43 PM  [facility] [address] [place], [place], [address] Phone: [phone]   Fax:  [phone]   Name: TRELYN VANDERLINDE MRN: [id number] Date of Birth: [birth year]

## 2017-11-29 NOTE — Patient Instructions (Signed)
   Cognitive Activities you can do at home:   - [place] (easy level)  - [place]  Make a box or basket labeled KEYS and put all keys in the same spot - you may need to use signs where you use the keys that say "Put the key back in the basket"   Use signs as visual reminders if there is anything else getting lost  Use a notebook and calendar to keep up to date on what is going on, even family visits or lunch dates

## 2017-12-01 ENCOUNTER — Ambulatory Visit: Payer: Medicare Other | Admitting: Physical Therapy

## 2017-12-04 ENCOUNTER — Encounter: Payer: Medicare Other | Attending: Registered Nurse | Admitting: Physical Medicine & Rehabilitation

## 2017-12-04 ENCOUNTER — Encounter: Payer: Self-pay | Admitting: Physical Medicine & Rehabilitation

## 2017-12-04 VITALS — BP 116/73 | HR 68 | Ht 67.0 in | Wt 158.0 lb

## 2017-12-04 DIAGNOSIS — M47816 Spondylosis without myelopathy or radiculopathy, lumbar region: Secondary | ICD-10-CM

## 2017-12-04 DIAGNOSIS — E119 Type 2 diabetes mellitus without complications: Secondary | ICD-10-CM | POA: Diagnosis not present

## 2017-12-04 DIAGNOSIS — E079 Disorder of thyroid, unspecified: Secondary | ICD-10-CM | POA: Insufficient documentation

## 2017-12-04 DIAGNOSIS — G9340 Encephalopathy, unspecified: Secondary | ICD-10-CM | POA: Diagnosis not present

## 2017-12-04 DIAGNOSIS — I10 Essential (primary) hypertension: Secondary | ICD-10-CM | POA: Insufficient documentation

## 2017-12-04 DIAGNOSIS — E871 Hypo-osmolality and hyponatremia: Secondary | ICD-10-CM | POA: Diagnosis not present

## 2017-12-04 DIAGNOSIS — K219 Gastro-esophageal reflux disease without esophagitis: Secondary | ICD-10-CM | POA: Diagnosis not present

## 2017-12-04 DIAGNOSIS — Z7901 Long term (current) use of anticoagulants: Secondary | ICD-10-CM | POA: Diagnosis not present

## 2017-12-04 DIAGNOSIS — R2689 Other abnormalities of gait and mobility: Secondary | ICD-10-CM | POA: Diagnosis not present

## 2017-12-04 DIAGNOSIS — Z09 Encounter for follow-up examination after completed treatment for conditions other than malignant neoplasm: Secondary | ICD-10-CM | POA: Diagnosis present

## 2017-12-04 DIAGNOSIS — Z87891 Personal history of nicotine dependence: Secondary | ICD-10-CM | POA: Insufficient documentation

## 2017-12-04 DIAGNOSIS — M8580 Other specified disorders of bone density and structure, unspecified site: Secondary | ICD-10-CM | POA: Diagnosis not present

## 2017-12-04 DIAGNOSIS — M069 Rheumatoid arthritis, unspecified: Secondary | ICD-10-CM | POA: Insufficient documentation

## 2017-12-04 DIAGNOSIS — R269 Unspecified abnormalities of gait and mobility: Secondary | ICD-10-CM | POA: Diagnosis not present

## 2017-12-04 DIAGNOSIS — I480 Paroxysmal atrial fibrillation: Secondary | ICD-10-CM | POA: Insufficient documentation

## 2017-12-04 DIAGNOSIS — E785 Hyperlipidemia, unspecified: Secondary | ICD-10-CM | POA: Insufficient documentation

## 2017-12-04 MED ORDER — LIDOCAINE 5 % EX PTCH: 2.0000 | MEDICATED_PATCH | CUTANEOUS | 4 refills | Status: DC

## 2017-12-04 NOTE — Patient Instructions (Addendum)
PLEASE FEEL FREE TO CALL OUR OFFICE WITH ANY PROBLEMS OR QUESTIONS ([phone])      

## 2017-12-04 NOTE — Progress Notes (Signed)
Subjective:    Patient ID: [name], male    DOB: 05-01-1941, 77 y.o.   MRN: [id number]  HPI   The patient is back today in follow-up of his gait disorder and encephalopathy.  He has continued to make progress.  He was discharged from speech therapy but is still working with physical therapy on balance gait and his low back pain.  His back pain generally has been a bit better.  He is using salon pas daily and that this is fairly effective managing his pain although he would like something else to help as well.  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  Uses cane for ambulation and balance.  Wife is going out of town for 10 days next month and has concerns about him being at home by himself.  He still is not driving.  Pain Inventory Average Pain 5 Pain Right Now 0 My pain is .  In the last 24 hours, has pain interfered with the following? General activity 0 Relation with others 0 Enjoyment of life 0 What TIME of day is your pain at its worst? na Sleep (in general) na  Pain is worse with: some activites Pain improves with: na Relief from Meds: 0  Mobility walk without assistance use a cane ability to climb steps?  yes do you drive?  no  Function retired  Neuro/Psych No problems in this area  Prior Studies Any changes since last visit?  no  Physicians involved in your care Any changes since last visit?  no   Family History  Problem Relation Age of Onset  . Diabetes Mellitus I Father   . Diabetes Mellitus I Mother   . CAD Brother    Social History   Socioeconomic History  . Marital status: Married    Spouse name: Not on file  . Number of children: Not on file  . Years of education: Not on file  . Highest education level: Not on file  Occupational History  . Not on file  Social Needs  . Financial resource strain: Not on file  . Food insecurity:    Worry: Not on  file    Inability: Not on file  . Transportation needs:    Medical: Not on file    Non-medical: Not on file  Tobacco Use  . Smoking status: Former Smoker    Types: Cigarettes    Last attempt to quit: 11/02/2014    Years since quitting: 3.0  . Smokeless tobacco: Never Used  Substance and Sexual Activity  . Alcohol use: Not Currently    Alcohol/week: 0.0 oz  . Drug use: No  . Sexual activity: Not on file  Lifestyle  . Physical activity:    Days per week: Not on file    Minutes per session: Not on file  . Stress: Not on file  Relationships  . Social connections:    Talks on phone: Not on file    Gets together: Not on file    Attends religious service: Not on file    Active member of club or organization: Not on file    Attends meetings of clubs or organizations: Not on file    Relationship status: Not on file  Other Topics Concern  . Not on file  Social History Narrative   Married   Retired   Scientist, physiological- PhD   Past Surgical History:  Procedure Laterality Date  .  COLONOSCOPY    . KNEE SURGERY Bilateral    to clear out arthritis   Past Medical History:  Diagnosis Date  . DM (diabetes mellitus) ([facility])   . GERD (gastroesophageal reflux disease)   . Hyperlipidemia   . Hypertension   . Legionella pneumonia ([facility])   . Osteoarthritis   . Osteopenia   . Rheumatoid arthritis ([facility])   . Thyroid disease    BP 116/73   Pulse 68   Ht 5' 7"  (1.702 m)   Wt 158 lb (71.7 kg)   SpO2 98%   BMI 24.75 kg/m   Opioid Risk Score:   Fall Risk Score:  `1  Depression screen PHQ 2/9  Depression screen PHQ 2/9 10/03/2017  Decreased Interest 0  Down, Depressed, Hopeless 0  PHQ - 2 Score 0     Review of Systems  Constitutional: Negative.   HENT: Negative.   Eyes: Negative.   Respiratory: Negative.   Cardiovascular: Negative.   Gastrointestinal: Negative.   Endocrine: Negative.   Genitourinary: Negative.   Musculoskeletal: Negative.   Skin: Negative.   Allergic/Immunologic:  Negative.   Neurological: Negative.   Hematological: Negative.   Psychiatric/Behavioral: Negative.   All other systems reviewed and are negative.      Objective:   Physical Exam  General: No acute distress HEENT: EOMI, oral membranes moist Cards: reg rate  Chest: normal effort Abdomen: Soft, NT, ND Skin: dry, intact Extremities: no edema Neuro: Patient has reasonable insight and awareness.  Does have some short-term memory deficits and occasional processing delays with more complex information.. Cranial nerves 2-12 are intact. Sensory exam is normal except for decreased light touch in the feet.  Reflexes are 2+ in all 4's. Fine motor coordination is intact. No tremors. Motor function is grossly 5/5.    He has improved nicely.  He is using a small base quad cane for balance and seems to shift weight nicely.  He transfers easily.  He is not impulsive.  Stride length and width are fairly normal. Musculoskeletal: Full ROM, No pain with AROM or PROM in the neck, trunk, or extremities. Posture appropriate.  He has minimal pain with palpation over the right lumbar paraspinal muscles.  Pain seems no worse with bending versus extension. Psych: Pt's affect is appropriate. Pt is cooperative       Assessment & Plan:  1. Encephalopathy/ Hyponatremia: Generally has resolved.  However patient still with short-term memory deficits in processing delays.  I suspect some of this is related to his small vessel disease and cerebral atrophy.    -Has completed SLP  -Continue with physical therapy for higher level gait  -We will make referral to driver rehab services for driving evaluation given his physical and cognitive deficits.  Both patient and wife are in agreement.  I fill paperwork out in the office today. 2. 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    -continue with cane.  Therapy as above 3. PAF: Continue Eliquis: Cardiology Following 4. Type 2 DM without Insulin: Continue  Metformin: PCP Following.  Appears to be under better control. 5.  Questionable depression: seems to be doing better here 6. Low back pain/spondylosis.  Symptoms appear fairly mild at this point               -continue therapies---> HEP   -lidoderm patches   -salonpas   30 minutes of face to face patient care time was spent during this visit. All questions were encouraged and answered.  I will  see the patient back in about 3 months time.

## 2017-12-06 ENCOUNTER — Other Ambulatory Visit: Payer: Self-pay | Admitting: Internal Medicine

## 2017-12-06 DIAGNOSIS — R945 Abnormal results of liver function studies: Secondary | ICD-10-CM

## 2017-12-06 DIAGNOSIS — R7989 Other specified abnormal findings of blood chemistry: Secondary | ICD-10-CM

## 2017-12-08 ENCOUNTER — Ambulatory Visit: Payer: Medicare Other | Admitting: Physical Therapy

## 2017-12-11 ENCOUNTER — Ambulatory Visit: Payer: Medicare Other | Admitting: Rehabilitation

## 2017-12-11 ENCOUNTER — Encounter: Payer: Self-pay | Admitting: Rehabilitation

## 2017-12-11 DIAGNOSIS — R2681 Unsteadiness on feet: Secondary | ICD-10-CM

## 2017-12-11 DIAGNOSIS — R41841 Cognitive communication deficit: Secondary | ICD-10-CM | POA: Diagnosis not present

## 2017-12-11 DIAGNOSIS — R2689 Other abnormalities of gait and mobility: Secondary | ICD-10-CM

## 2017-12-11 DIAGNOSIS — M6281 Muscle weakness (generalized): Secondary | ICD-10-CM

## 2017-12-11 NOTE — Therapy (Signed)
[facility] [address] [place], [place], [address] Phone: [phone]   Fax:  [phone]  Physical Therapy Treatment  Patient Details  Name: [name] MRN: [id number] Date of Birth: [birth year] Referring Provider: Dr. Naaman Plummer   Encounter Date: 12/11/2017  PT End of Session - 12/11/17 1300    Visit Number  8    Number of Visits  17    Date for PT Re-Evaluation  01/10/18    Authorization Type  [employer] Medicare    PT Start Time  0845    PT Stop Time  0929    PT Time Calculation (min)  44 min    Equipment Utilized During Treatment  -- S prn    Activity Tolerance  Patient tolerated treatment well;No increased pain    Behavior During Therapy  WFL for tasks assessed/performed       Past Medical History:  Diagnosis Date  . DM (diabetes mellitus) ([facility])   . GERD (gastroesophageal reflux disease)   . Hyperlipidemia   . Hypertension   . Legionella pneumonia ([facility])   . Osteoarthritis   . Osteopenia   . Rheumatoid arthritis ([facility])   . Thyroid disease     Past Surgical History:  Procedure Laterality Date  . COLONOSCOPY    . KNEE SURGERY Bilateral    to clear out arthritis    There were no vitals filed for this visit.  Subjective Assessment - 12/11/17 1251    Subjective  Pt reports no changes.  He feels that balance is okay.  He reports he "doesn't expect to be normal again."     Pertinent History  A-fib, HTN, DM, hypothyroidism, RA, osteopenia, spinal stenosis (per pt), OA in B knees    Patient Stated Goals  Improve balance and walk safely, get back to driving    Currently in Pain?  Yes    Pain Score  6     Pain Location  Back    Pain Orientation  Lower    Pain Descriptors / Indicators  Aching    Pain Type  Chronic pain    Pain Onset  More than a month ago    Pain Frequency  Constant    Aggravating Factors   walking    Pain Relieving Factors  tylenol, pain patch                        [facility] Adult PT Treatment/Exercise - 12/11/17 0859      Ambulation/Gait   Ambulation/Gait  Yes    Ambulation/Gait Assistance  5: Supervision;4: Min guard    Ambulation/Gait Assistance Details  Assessed LTG for outdoor ambulation with use of SPC.  Ambulated up to 1000' around building on grass and paved surfaces.  Pt requires S for safety over paved surfaces and closer S to min/guard for gait over grass.  He reports he never walks across grass therefore he would not need to assess this.   Cues for improved cane placement and increased stride length.      Ambulation Distance (Feet)  1000 Feet    Assistive device  Straight cane    Gait Pattern  Step-through pattern;Decreased stride length;Decreased trunk rotation;Trendelenburg;Narrow base of support    Ambulation Surface  Level;Indoor;Unlevel;Outdoor;Paved;Grass    Stairs  Yes    Stairs Assistance  5: Supervision    Stairs Assistance Details (indicate cue type and reason)  S for safety and cues for safe descent    Stair  Management Technique  One rail Right;Alternating pattern;Forwards    Number of Stairs  8    Height of Stairs  6      Standardized Balance Assessment   Standardized Balance Assessment  Dynamic Gait Index      Dynamic Gait Index   Level Surface  Mild Impairment    Change in Gait Speed  Mild Impairment    Gait with Horizontal Head Turns  Mild Impairment    Gait with Vertical Head Turns  Mild Impairment    Gait and Pivot Turn  Mild Impairment    Step Over Obstacle  Moderate Impairment    Step Around Obstacles  Mild Impairment    Steps  Mild Impairment    Total Score  15          Do these in a corner with a chair in front of you for safety.   Feet Apart (Compliant Surface) Arm Motion - Eyes Closed    Stand on compliant surface: ___pillow (thick enough to take firmness of the ground away)_____, feet shoulder width apart. Close eyes and keep arms by your side.  Repeat __3__ times per  session for 20 secs each. Do __2__ sessions per day.  Copyright  VHI. All rights reserved.   Feet Together (Compliant Surface) Arm Motion - Eyes Open    With eyes open, standing on compliant surface:___pillow_____, feet together, keep arms by your side.  Repeat __3__ times per session for 20 secs each. Do __2__ sessions per day.  Copyright  VHI. All rights reserved.     PT Education - 12/11/17 1259    Education provided  Yes    Education Details  Progress with therapy, recommendation for 4 more weeks to address balance deficits, additional balance HEP exercises    Person(s) Educated  Patient    Methods  Explanation;Demonstration;Handout    Comprehension  Verbalized understanding;Returned demonstration       PT Short Term Goals - 11/17/17 0930      PT SHORT TERM GOAL #1   Title  Pt will be IND in HEP in order to improve strength, balance, and safety. TARGET DATE FOR ALL LTGS: 11/10/17    Baseline  required cues and demo on 11/17/17    Status  Partially Met      PT SHORT TERM GOAL #2   Title  Pt will improve DGI score to >/=15/24 to decr. falls risk.     Baseline  14/24 on 11/07/17    Status  Partially Met      PT SHORT TERM GOAL #3   Title  Pt will improve gait speed with LRAD to >/=2.34f/sec. in order to safely amb. in the community.     Baseline  2.086fsec. with SPC limited by LBP    Status  Not Met      PT SHORT TERM GOAL #4   Title  Pt will amb. 500' with LRAD at MOD I level over even terrain to improve safety during functional mobility.     Status  Achieved        PT Long Term Goals - 12/11/17 0850      PT LONG TERM GOAL #1   Title  Pt will verbalize understanding of fall prevention strategies to reduce falls risk. TARGET DATE FOR ALL LTGS: 12/08/17    Baseline  Needs max cues to recall any fall prevention strategies. 12/11/17    Status  Not Met      PT LONG TERM GOAL #2  Title  Pt will amb. 700' over even/uneven terrain at MOD I level with LRAD to improve  safety during functional mobility.     Baseline  PT recommends S for outdoor surfaces, esp past 500' with SPC on 12/11/17.  He is ambulating 2 blocks at home with wife observing from house per pt report.      Status  Partially Met      PT LONG TERM GOAL #3   Title  Pt will improve DGI score to >/=20/24 to decr. falls risk.     Baseline  15/24 on 12/11/17, only single point improvement from baseline    Status  Partially Met      PT LONG TERM GOAL #4   Title  Pt will amb. 100' no AD, with S, to amb. safely at home.     Baseline  S in clinic without device 12/11/17    Status  Achieved        Note these are new LTGs:  PT Long Term Goals - 12/11/17 0850      PT LONG TERM GOAL #1   Title  Pt will verbalize understanding of fall prevention strategies to reduce falls risk. New TARGET DATE FOR ALL LTGS: 01/10/18    Baseline  Needs max cues to recall any fall prevention strategies. 12/11/17    Status  On-going      PT LONG TERM GOAL #2   Title  Pt will amb. 700' over even/uneven terrain at MOD I level with LRAD to improve safety during functional mobility.     Baseline  PT recommends S for outdoor surfaces, esp past 500' with SPC on 12/11/17.  He is ambulating 2 blocks at home with wife observing from house per pt report.      Status  On-going      PT LONG TERM GOAL #3   Title  Pt will improve DGI score to >/=18/24 to decr. falls risk.     Baseline  15/24 on 12/11/17, only single point improvement from baseline    Status  Revised      PT LONG TERM GOAL #4   Title  Pt will amb. 100' no AD, with S, to amb. safely at home.     Baseline  S in clinic without device 12/11/17    Status  Achieved      PT LONG TERM GOAL #5   Title  Pt will negotiate up/down 12 steps with single rail in reciprocal pattern without compensation at mod I level in order to indicate safe home negotiation.     Time  4    Period  Weeks    Status  New          Plan - 12/11/17 1303    Clinical Impression Statement   Skilled session began to assess LTGs to determine if D/C or re-cert was appropriate.  Note that he continues to be high fall risk with DGI of 15/24, only demonstrating single point improvement from basline.  He reports he is only doing 1-2 exercises from book daily.  Encouraged him to increase to 5-6 exercises.  PT did add more specific balance exercises today.  PT plans to recert for 4 more weeks to address deficits.      Rehab Potential  Good    Clinical Impairments Affecting Rehab Potential  see above.     PT Frequency  2x / week with potential to d/c early    PT Duration  4 weeks  PT Treatment/Interventions  ADLs/Self Care Home Management;Biofeedback;Canalith Repostioning;Electrical Stimulation;Therapeutic exercise;Therapeutic activities;Manual techniques;Vestibular;Orthotic Fit/Training;Functional mobility training;Stair training;Gait training;Patient/family education;DME Instruction;Neuromuscular re-education;Balance training    PT Next Visit Plan  Fall risk education. Monitor BP 2/2 medical hx. continue to work on gait without cane, high level balance, pertubations.     Consulted and Agree with Plan of Care  Patient       Patient will benefit from skilled therapeutic intervention in order to improve the following deficits and impairments:  Abnormal gait, Decreased endurance, Decreased knowledge of use of DME, Decreased strength, Decreased mobility, Decreased balance, Decreased cognition, Impaired flexibility, Postural dysfunction, Decreased safety awareness, Pain(PT will closely monitor pain but will not directly address)  Visit Diagnosis: Other abnormalities of gait and mobility  Unsteadiness on feet  Muscle weakness (generalized)     Problem List Patient Active Problem List   Diagnosis Date Noted  . Spondylosis without myelopathy or radiculopathy, lumbar region 12/04/2017  . Encephalopathy 08/18/2017  . Fever   . SIADH (syndrome of inappropriate ADH production) ([facility])   . PAF  (paroxysmal atrial fibrillation) ([facility])   . Type 2 diabetes mellitus with peripheral neuropathy ([facility])   . Hypokalemia   . Urinary retention   . Acute blood loss anemia   . Hypothyroidism   . Spinal stenosis 08/12/2017  . Multifactorial gait disorder 08/12/2017  . Type 2 diabetes mellitus with other specified complication ([facility]) 16/11/3708  . Hyponatremia 08/12/2017  . Atrial fibrillation ([facility]) 01/16/2016  . Rheumatoid arthritis ([facility]) 01/16/2016  . History of artificial joint 12/19/2014  . H/O knee surgery 12/19/2014  . Primary osteoarthritis of both knees 10/27/2014  . Osteopenia 03/31/2014  . HEARTBURN 09/04/2009    Eaton Folmar, Betha Loa 12/11/2017, 1:08 PM  [facility] [address] [place], [place], [address] Phone: [phone]   Fax:  [phone]  Name: [name] MRN: [id number] Date of Birth: 06-Jan-1941

## 2017-12-11 NOTE — Patient Instructions (Signed)
Do these in a corner with a chair in front of you for safety.   Feet Apart (Compliant Surface) Arm Motion - Eyes Closed    Stand on compliant surface: ___pillow (thick enough to take firmness of the ground away)_____, feet shoulder width apart. Close eyes and keep arms by your side.  Repeat __3__ times per session for 20 secs each. Do __2__ sessions per day.  Copyright  VHI. All rights reserved.   Feet Together (Compliant Surface) Arm Motion - Eyes Open    With eyes open, standing on compliant surface:___pillow_____, feet together, keep arms by your side.  Repeat __3__ times per session for 20 secs each. Do __2__ sessions per day.  Copyright  VHI. All rights reserved.

## 2017-12-13 ENCOUNTER — Encounter: Payer: Self-pay | Admitting: Physical Therapy

## 2017-12-13 ENCOUNTER — Ambulatory Visit: Payer: Medicare Other | Admitting: Physical Therapy

## 2017-12-13 DIAGNOSIS — R41841 Cognitive communication deficit: Secondary | ICD-10-CM | POA: Diagnosis not present

## 2017-12-13 DIAGNOSIS — M6281 Muscle weakness (generalized): Secondary | ICD-10-CM

## 2017-12-13 DIAGNOSIS — R2689 Other abnormalities of gait and mobility: Secondary | ICD-10-CM

## 2017-12-13 DIAGNOSIS — R2681 Unsteadiness on feet: Secondary | ICD-10-CM

## 2017-12-13 NOTE — Therapy (Signed)
[facility] [address] [place], [place], [address] Phone: [phone]   Fax:  [phone]  Physical Therapy Treatment  Patient Details  Name: [name] MRN: [id number] Date of Birth: 08-10-1940 Referring Provider: Dr. Naaman Plummer   Encounter Date: 12/13/2017  PT End of Session - 12/13/17 0931    Visit Number  9    Number of Visits  17    Date for PT Re-Evaluation  01/10/18    Authorization Type  [employer] Medicare    PT Start Time  [phone]    PT Stop Time  0930    PT Time Calculation (min)  38 min    Equipment Utilized During Treatment  -- S prn    Activity Tolerance  Patient tolerated treatment well;No increased pain    Behavior During Therapy  WFL for tasks assessed/performed       Past Medical History:  Diagnosis Date  . DM (diabetes mellitus) ([place])   . GERD (gastroesophageal reflux disease)   . Hyperlipidemia   . Hypertension   . Legionella pneumonia ([place])   . Osteoarthritis   . Osteopenia   . Rheumatoid arthritis ([place])   . Thyroid disease     Past Surgical History:  Procedure Laterality Date  . COLONOSCOPY    . KNEE SURGERY Bilateral    to clear out arthritis    There were no vitals filed for this visit.  Subjective Assessment - 12/13/17 0902    Subjective  Pt tried updated balance HEP at home.    Pertinent History  A-fib, HTN, DM, hypothyroidism, RA, osteopenia, spinal stenosis (per pt), OA in B knees    Patient Stated Goals  Improve balance and walk safely, get back to driving    Currently in Pain?  No/denies    Pain Onset  More than a month ago                       [facility] Adult PT Treatment/Exercise - 12/13/17 0001      Ambulation/Gait   Ramp  5: Supervision    Ramp Details (indicate cue type and reason)  working on more coordinated gait pattern, multiple reps          Balance Exercises - 12/13/17 0906      Balance Exercises: Standing   Standing Eyes Opened   Narrow base of support (BOS);Head turns;Foam/compliant surface + upper trunk rotations, + weight shifting F/B, lateral    Standing Eyes Closed  Narrow base of support (BOS);Foam/compliant surface;3 reps;10 secs    SLS  Eyes open multidirectional tapping cones, Min A, progressed with upright gaze; cues for posture    Stepping Strategy  Anterior;Lateral;Posterior Min guard, cues for weight shfiting and posture    Turning  Both;10 reps figure 8s working on visual scanning, supervision, no AD          PT Short Term Goals - 11/17/17 0930      PT SHORT TERM GOAL #1   Title  Pt will be IND in HEP in order to improve strength, balance, and safety. TARGET DATE FOR ALL LTGS: 11/10/17    Baseline  required cues and demo on 11/17/17    Status  Partially Met      PT SHORT TERM GOAL #2   Title  Pt will improve DGI score to >/=15/24 to decr. falls risk.     Baseline  14/24 on 11/07/17    Status  Partially Met  PT SHORT TERM GOAL #3   Title  Pt will improve gait speed with LRAD to >/=2.27f/sec. in order to safely amb. in the community.     Baseline  2.089fsec. with SPC limited by LBP    Status  Not Met      PT SHORT TERM GOAL #4   Title  Pt will amb. 500' with LRAD at MOD I level over even terrain to improve safety during functional mobility.     Status  Achieved        PT Long Term Goals - 12/11/17 0850      PT LONG TERM GOAL #1   Title  Pt will verbalize understanding of fall prevention strategies to reduce falls risk. New TARGET DATE FOR ALL LTGS: 01/10/18    Baseline  Needs max cues to recall any fall prevention strategies. 12/11/17    Status  On-going      PT LONG TERM GOAL #2   Title  Pt will amb. 700' over even/uneven terrain at MOD I level with LRAD to improve safety during functional mobility.     Baseline  PT recommends S for outdoor surfaces, esp past 500' with SPC on 12/11/17.  He is ambulating 2 blocks at home with wife observing from house per pt report.      Status   On-going      PT LONG TERM GOAL #3   Title  Pt will improve DGI score to >/=18/24 to decr. falls risk.     Baseline  15/24 on 12/11/17, only single point improvement from baseline    Status  Revised      PT LONG TERM GOAL #4   Title  Pt will amb. 100' no AD, with S, to amb. safely at home.     Baseline  S in clinic without device 12/11/17    Status  Achieved      PT LONG TERM GOAL #5   Title  Pt will negotiate up/down 12 steps with single rail in reciprocal pattern without compensation at mod I level in order to indicate safe home negotiation.     Time  4    Period  Weeks    Status  New            Plan - 12/13/17 1349    Clinical Impression Statement  Skilled session focused on dynamic balance: Pt required intermittent UE support with standing balance on compliant surface with feet together and head turns;  Pt requires intermittent Min A with SLS stance tapping cones and multidirectional stepping with weight shifts on noncompliant surface.  Balance training with dynamic gait without SPC working on functional turns; pt selfcorrected imbalances at [email or web address].                                                         Rehab Potential  Good    Clinical Impairments Affecting Rehab Potential  see above.     PT Frequency  2x / week with potential to d/c early    PT Duration  4 weeks    PT Treatment/Interventions  ADLs/Self Care Home Management;Biofeedback;Canalith Repostioning;Electrical Stimulation;Therapeutic exercise;Therapeutic activities;Manual techniques;Vestibular;Orthotic Fit/Training;Functional mobility training;Stair training;Gait training;Patient/family education;DME Instruction;Neuromuscular re-education;Balance training    PT Next Visit Plan  Fall risk education. Monitor BP 2/2 medical hx. continue to work on  gait without cane, high level balance, pertubations.     Consulted and Agree with Plan of Care  Patient       Patient will benefit from skilled therapeutic  intervention in order to improve the following deficits and impairments:  Abnormal gait, Decreased endurance, Decreased knowledge of use of DME, Decreased strength, Decreased mobility, Decreased balance, Decreased cognition, Impaired flexibility, Postural dysfunction, Decreased safety awareness, Pain(PT will closely monitor pain but will not directly address)  Visit Diagnosis: Other abnormalities of gait and mobility  Unsteadiness on feet  Muscle weakness (generalized)     Problem List Patient Active Problem List   Diagnosis Date Noted  . Spondylosis without myelopathy or radiculopathy, lumbar region 12/04/2017  . Encephalopathy 08/18/2017  . Fever   . SIADH (syndrome of inappropriate ADH production) ([place])   . PAF (paroxysmal atrial fibrillation) ([place])   . Type 2 diabetes mellitus with peripheral neuropathy ([facility])   . Hypokalemia   . Urinary retention   . Acute blood loss anemia   . Hypothyroidism   . Spinal stenosis 08/12/2017  . Multifactorial gait disorder 08/12/2017  . Type 2 diabetes mellitus with other specified complication ([place]) 30/14/8403  . Hyponatremia 08/12/2017  . Atrial fibrillation ([place]) 01/16/2016  . Rheumatoid arthritis ([place]) 01/16/2016  . History of artificial joint 12/19/2014  . H/O knee surgery 12/19/2014  . Primary osteoarthritis of both knees 10/27/2014  . Osteopenia 03/31/2014  . HEARTBURN 09/04/2009   Bjorn Loser, PTA  12/13/17, 1:58 PM [facility] [address] [place], [place], [address] Phone: [phone]   Fax:  [phone]  Name: [name] MRN: [id number] Date of Birth: [birth year]

## 2017-12-15 ENCOUNTER — Ambulatory Visit
Admission: RE | Admit: 2017-12-15 | Discharge: 2017-12-15 | Disposition: A | Discharge status: Home / Self Care | Payer: Medicare Other | Source: Ambulatory Visit | Attending: Internal Medicine | Admitting: Internal Medicine

## 2017-12-15 ENCOUNTER — Ambulatory Visit: Payer: Medicare Other | Admitting: Rehabilitation

## 2017-12-15 DIAGNOSIS — R945 Abnormal results of liver function studies: Secondary | ICD-10-CM

## 2017-12-15 DIAGNOSIS — R7989 Other specified abnormal findings of blood chemistry: Secondary | ICD-10-CM

## 2017-12-21 ENCOUNTER — Ambulatory Visit: Payer: Medicare Other | Attending: Physical Medicine & Rehabilitation

## 2017-12-21 VITALS — BP 118/72 | HR 66

## 2017-12-21 DIAGNOSIS — M6281 Muscle weakness (generalized): Secondary | ICD-10-CM | POA: Diagnosis present

## 2017-12-21 DIAGNOSIS — R2681 Unsteadiness on feet: Secondary | ICD-10-CM

## 2017-12-21 DIAGNOSIS — R2689 Other abnormalities of gait and mobility: Secondary | ICD-10-CM | POA: Diagnosis present

## 2017-12-21 NOTE — Therapy (Signed)
[facility] [address] [place], [place], [address] Phone: [phone]   Fax:  [phone]  Physical Therapy Treatment  Patient Details  Name: [name] MRN: [id number] Date of Birth: 25-Oct-1940 Referring Provider: Dr. Naaman Plummer   Encounter Date: 12/21/2017  PT End of Session - 12/21/17 0928    Visit Number  10    Number of Visits  17    Date for PT Re-Evaluation  01/10/18    Authorization Type  [employer]HC Medicare    PT Start Time  0846    PT Stop Time  0925    PT Time Calculation (min)  39 min    Equipment Utilized During Treatment  Gait belt    Activity Tolerance  Patient tolerated treatment well    Behavior During Therapy  [facility] for tasks assessed/performed       Past Medical History:  Diagnosis Date  . DM (diabetes mellitus) ([place])   . GERD (gastroesophageal reflux disease)   . Hyperlipidemia   . Hypertension   . Legionella pneumonia ([place])   . Osteoarthritis   . Osteopenia   . Rheumatoid arthritis ([place])   . Thyroid disease     Past Surgical History:  Procedure Laterality Date  . COLONOSCOPY    . KNEE SURGERY Bilateral    to clear out arthritis    Vitals:   12/21/17 0848  BP: 118/72  Pulse: 66    Subjective Assessment - 12/21/17 0848    Subjective  Pt denied falls or changes since last visit. Pt has an appt. on 01/09/18 to schedule potential surgery for spinal stenosis.     Pertinent History  A-fib, HTN, DM, hypothyroidism, RA, osteopenia, spinal stenosis (per pt), OA in B knees    Patient Stated Goals  Improve balance and walk safely, get back to driving    Currently in Pain?  Yes    Pain Score  7     Pain Location  Back    Pain Orientation  Lower    Pain Descriptors / Indicators  Aching    Pain Type  Chronic pain    Pain Onset  More than a month ago    Pain Frequency  Constant    Aggravating Factors   walking, sit to stand txf    Pain Relieving Factors  tylenol, pain patch                        [facility] Adult PT Treatment/Exercise - 12/21/17 0859      Ambulation/Gait   Ambulation/Gait  Yes    Ambulation/Gait Assistance  5: Supervision    Ambulation/Gait Assistance Details  Pt amb. over paved surfaces with and without SPC, cues for sequencing with SPC. Pt required seated rest break after amb. 2/2 8/10 knee pain. Cues to improve heel strike and stride length, and to decr. intermittent narrow BOS.     Ambulation Distance (Feet)  700 Feet outdoors and 300' indoors    Assistive device  Straight cane;None    Gait Pattern  Step-through pattern;Decreased stride length;Decreased trunk rotation;Trendelenburg;Narrow base of support    Ambulation Surface  Level;Indoor;Unlevel;Paved;Outdoor          Balance Exercises - 12/21/17 0926      Balance Exercises: Standing   Rockerboard  Anterior/posterior;Lateral;Head turns;EO;EC;10 seconds;30 seconds;5 reps;10 reps;Intermittent UE support    Other Standing Exercises  Pt performed in // bars with min A to min guard for safety. Cues and demo for  technique.        Self Care: PT Education - 12/21/17 0858    Education provided  Yes    Education Details  Provided pt with falls education.     Person(s) Educated  Patient    Methods  Explanation;Handout    Comprehension  Verbalized understanding       PT Short Term Goals - 11/17/17 0930      PT SHORT TERM GOAL #1   Title  Pt will be IND in HEP in order to improve strength, balance, and safety. TARGET DATE FOR ALL LTGS: 11/10/17    Baseline  required cues and demo on 11/17/17    Status  Partially Met      PT SHORT TERM GOAL #2   Title  Pt will improve DGI score to >/=15/24 to decr. falls risk.     Baseline  14/24 on 11/07/17    Status  Partially Met      PT SHORT TERM GOAL #3   Title  Pt will improve gait speed with LRAD to >/=2.56f/sec. in order to safely amb. in the community.     Baseline  2.03fsec. with SPC limited by LBP    Status  Not Met       PT SHORT TERM GOAL #4   Title  Pt will amb. 500' with LRAD at MOD I level over even terrain to improve safety during functional mobility.     Status  Achieved        PT Long Term Goals - 12/11/17 0850      PT LONG TERM GOAL #1   Title  Pt will verbalize understanding of fall prevention strategies to reduce falls risk. New TARGET DATE FOR ALL LTGS: 01/10/18    Baseline  Needs max cues to recall any fall prevention strategies. 12/11/17    Status  On-going      PT LONG TERM GOAL #2   Title  Pt will amb. 700' over even/uneven terrain at MOD I level with LRAD to improve safety during functional mobility.     Baseline  PT recommends S for outdoor surfaces, esp past 500' with SPC on 12/11/17.  He is ambulating 2 blocks at home with wife observing from house per pt report.      Status  On-going      PT LONG TERM GOAL #3   Title  Pt will improve DGI score to >/=18/24 to decr. falls risk.     Baseline  15/24 on 12/11/17, only single point improvement from baseline    Status  Revised      PT LONG TERM GOAL #4   Title  Pt will amb. 100' no AD, with S, to amb. safely at home.     Baseline  S in clinic without device 12/11/17    Status  Achieved      PT LONG TERM GOAL #5   Title  Pt will negotiate up/down 12 steps with single rail in reciprocal pattern without compensation at mod I level in order to indicate safe home negotiation.     Time  4    Period  Weeks    Status  New            Plan - 12/21/17 [id number]  Clinical Impression Statement  Skilled session focused on gait training to improve arm swing, heel strike, and stride length with and without SPC. Pt also demonstrated improved balance, as he tolerated high level balance activities well with min  guard to min A. Pt would continue to benefit from skilled PT to improve safety during functional mobility.     Rehab Potential  Good    Clinical Impairments Affecting Rehab Potential  see above.     PT Frequency  2x / week with potential to d/c  early    PT Duration  4 weeks    PT Treatment/Interventions  ADLs/Self Care Home Management;Biofeedback;Canalith Repostioning;Electrical Stimulation;Therapeutic exercise;Therapeutic activities;Manual techniques;Vestibular;Orthotic Fit/Training;Functional mobility training;Stair training;Gait training;Patient/family education;DME Instruction;Neuromuscular re-education;Balance training    PT Next Visit Plan  Monitor BP 2/2 medical hx. continue to work on gait without cane, high level balance, pertubations.     Consulted and Agree with Plan of Care  Patient       Patient will benefit from skilled therapeutic intervention in order to improve the following deficits and impairments:  Abnormal gait, Decreased endurance, Decreased knowledge of use of DME, Decreased strength, Decreased mobility, Decreased balance, Decreased cognition, Impaired flexibility, Postural dysfunction, Decreased safety awareness, Pain(PT will closely monitor pain but will not directly address)  Visit Diagnosis: Other abnormalities of gait and mobility  Unsteadiness on feet  Muscle weakness (generalized)     Problem List Patient Active Problem List   Diagnosis Date Noted  . Spondylosis without myelopathy or radiculopathy, lumbar region 12/04/2017  . Encephalopathy 08/18/2017  . Fever   . SIADH (syndrome of inappropriate ADH production) ([place])   . PAF (paroxysmal atrial fibrillation) ([place])   . Type 2 diabetes mellitus with peripheral neuropathy ([facility])   . Hypokalemia   . Urinary retention   . Acute blood loss anemia   . Hypothyroidism   . Spinal stenosis 08/12/2017  . Multifactorial gait disorder 08/12/2017  . Type 2 diabetes mellitus with other specified complication ([place]) 92/05/69  . Hyponatremia 08/12/2017  . Atrial fibrillation ([place]) 01/16/2016  . Rheumatoid arthritis ([place]) 01/16/2016  . History of artificial joint 12/19/2014  . H/O knee surgery 12/19/2014  . Primary osteoarthritis of both knees 10/27/2014   . Osteopenia 03/31/2014  . HEARTBURN 09/04/2009    Eloisa Chokshi L 12/21/2017, 9:30 AM  [facility] [address] [place], [place], [address] Phone: [phone]   Fax:  [phone]  Name: [name] MRN: [id number] Date of Birth: August 22, 1940   Progress Note Reporting Period 10/13/17 to 12/21/17  See note below for Objective Data and Assessment of Progress/Goals. Pt making progress towards goals.  Geoffry Paradise, PT,DPT 12/21/17 9:33 AM Phone: [phone] Fax: [phone]

## 2017-12-21 NOTE — Patient Instructions (Signed)
Fall Prevention in the Home Falls can cause injuries and can affect people from all age groups. There are many simple things that you can do to make your home safe and to help prevent falls. What can I do on the outside of my home?  Regularly repair the edges of walkways and driveways and fix any cracks.  Remove high doorway thresholds.  Trim any shrubbery on the main path into your home.  Use bright outdoor lighting.  Clear walkways of debris and clutter, including tools and rocks.  Regularly check that handrails are securely fastened and in good repair. Both sides of any steps should have handrails.  Install guardrails along the edges of any raised decks or porches.  Have leaves, snow, and ice cleared regularly.  Use sand or salt on walkways during winter months.  In the garage, clean up any spills right away, including grease or oil spills. What can I do in the bathroom?  Use night lights.  Install grab bars by the toilet and in the tub and shower. Do not use towel bars as grab bars.  Use non-skid mats or decals on the floor of the tub or shower.  If you need to sit down while you are in the shower, use a plastic, non-slip stool.  Keep the floor dry. Immediately clean up any water that spills on the floor.  Remove soap buildup in the tub or shower on a regular basis.  Attach bath mats securely with double-sided non-slip rug tape.  Remove throw rugs and other tripping hazards from the floor. What can I do in the bedroom?  Use night lights.  Make sure that a bedside light is easy to reach.  Do not use oversized bedding that drapes onto the floor.  Have a firm chair that has side arms to use for getting dressed.  Remove throw rugs and other tripping hazards from the floor. What can I do in the kitchen?  Clean up any spills right away.  Avoid walking on wet floors.  Place frequently used items in easy-to-reach places.  If you need to reach for something above  you, use a sturdy step stool that has a grab bar.  Keep electrical cables out of the way.  Do not use floor polish or wax that makes floors slippery. If you have to use wax, make sure that it is non-skid floor wax.  Remove throw rugs and other tripping hazards from the floor. What can I do in the stairways?  Do not leave any items on the stairs.  Make sure that there are handrails on both sides of the stairs. Fix handrails that are broken or loose. Make sure that handrails are as long as the stairways.  Check any carpeting to make sure that it is firmly attached to the stairs. Fix any carpet that is loose or worn.  Avoid having throw rugs at the top or bottom of stairways, or secure the rugs with carpet tape to prevent them from moving.  Make sure that you have a light switch at the top of the stairs and the bottom of the stairs. If you do not have them, have them installed. What are some other fall prevention tips?  Wear closed-toe shoes that fit well and support your feet. Wear shoes that have rubber soles or low heels.  When you use a stepladder, make sure that it is completely opened and that the sides are firmly locked. Have someone hold the ladder while you are using  it. Do not climb a closed stepladder.  Add color or contrast paint or tape to grab bars and handrails in your home. Place contrasting color strips on the first and last steps.  Use mobility aids as needed, such as canes, walkers, scooters, and crutches.  Turn on lights if it is dark. Replace any light bulbs that burn out.  Set up furniture so that there are clear paths. Keep the furniture in the same spot.  Fix any uneven floor surfaces.  Choose a carpet design that does not hide the edge of steps of a stairway.  Be aware of any and all pets.  Review your medicines with your healthcare provider. Some medicines can cause dizziness or changes in blood pressure, which increase your risk of falling. Talk with  your health care provider about other ways that you can decrease your risk of falls. This may include working with a physical therapist or trainer to improve your strength, balance, and endurance. This information is not intended to replace advice given to you by your health care provider. Make sure you discuss any questions you have with your health care provider. Document Released: 04/29/2002 Document Revised: 10/06/2015 Document Reviewed: 06/13/2014 Elsevier Interactive Patient Education  [employer].

## 2017-12-22 ENCOUNTER — Telehealth: Payer: Self-pay | Admitting: *Deleted

## 2017-12-22 ENCOUNTER — Encounter: Payer: Self-pay | Admitting: *Deleted

## 2017-12-22 ENCOUNTER — Ambulatory Visit: Payer: Medicare Other

## 2017-12-22 DIAGNOSIS — R2689 Other abnormalities of gait and mobility: Secondary | ICD-10-CM

## 2017-12-22 DIAGNOSIS — M6281 Muscle weakness (generalized): Secondary | ICD-10-CM

## 2017-12-22 DIAGNOSIS — R2681 Unsteadiness on feet: Secondary | ICD-10-CM

## 2017-12-22 NOTE — Therapy (Signed)
[facility] [address] [place], [place], [address] Phone: [phone]   Fax:  [phone]  Physical Therapy Treatment  Patient Details  Name: [name] MRN: [id number] Date of Birth: 1940/05/29 Referring Provider: Dr. Naaman Plummer   Encounter Date: 12/22/2017  PT End of Session - 12/22/17 0926    Visit Number  11    Number of Visits  17    Date for PT Re-Evaluation  01/10/18    Authorization Type  UHC Medicare    PT Start Time  0848    PT Stop Time  0926    PT Time Calculation (min)  38 min    Equipment Utilized During Treatment  Gait belt    Activity Tolerance  Patient tolerated treatment well;No increased pain    Behavior During Therapy  WFL for tasks assessed/performed       Past Medical History:  Diagnosis Date  . DM (diabetes mellitus) ([place])   . GERD (gastroesophageal reflux disease)   . Hyperlipidemia   . Hypertension   . Legionella pneumonia ([place])   . Osteoarthritis   . Osteopenia   . Rheumatoid arthritis ([place])   . Thyroid disease     Past Surgical History:  Procedure Laterality Date  . COLONOSCOPY    . KNEE SURGERY Bilateral    to clear out arthritis    There were no vitals filed for this visit.  Subjective Assessment - 12/22/17 0850    Subjective  Pt denied falls or changes since last visit.     Pertinent History  A-fib, HTN, DM, hypothyroidism, RA, osteopenia, spinal stenosis (per pt), OA in B knees    Patient Stated Goals  Improve balance and walk safely, get back to driving    Currently in Pain?  Yes    Pain Score  7     Pain Location  Back    Pain Orientation  Lower    Pain Descriptors / Indicators  Aching    Pain Type  Chronic pain    Pain Onset  More than a month ago    Pain Frequency  Constant    Aggravating Factors   walking, sit to stand txf    Pain Relieving Factors  tylenol, pain patch                       [facility] Adult PT Treatment/Exercise - 12/22/17  6203      Ambulation/Gait   Ambulation/Gait  Yes    Ambulation/Gait Assistance  5: Supervision    Ambulation/Gait Assistance Details  Cues to improve arm swing and heel strike during amb. outdoors. Pt then amb. indoors with PT and pt holding walking poles to encourage reciprocal arm swing.    Ambulation Distance (Feet)  400 Feet outdoors and 345' indoorsc poles and 345' s poles    Assistive device  None and walking poles    Gait Pattern  Step-through pattern;Decreased stride length;Decreased trunk rotation;Trendelenburg;Narrow base of support    Ambulation Surface  Level;Unlevel;Indoor;Outdoor;Paved      High Level Balance   High Level Balance Activities  Braiding;Marching forwards;Head turns;Backward walking;Tandem walking    High Level Balance Comments  Performed on red/blue mats in // bars with min guard to S and cues for technique. 4-6x20'/activity. 0-2 UE support as indicated. Pt reported pain reduced to 5/10 after session.             PT Short Term Goals - 11/17/17 0930  PT SHORT TERM GOAL #1   Title  Pt will be IND in HEP in order to improve strength, balance, and safety. TARGET DATE FOR ALL LTGS: 11/10/17    Baseline  required cues and demo on 11/17/17    Status  Partially Met      PT SHORT TERM GOAL #2   Title  Pt will improve DGI score to >/=15/24 to decr. falls risk.     Baseline  14/24 on 11/07/17    Status  Partially Met      PT SHORT TERM GOAL #3   Title  Pt will improve gait speed with LRAD to >/=2.58f/sec. in order to safely amb. in the community.     Baseline  2.072fsec. with SPC limited by LBP    Status  Not Met      PT SHORT TERM GOAL #4   Title  Pt will amb. 500' with LRAD at MOD I level over even terrain to improve safety during functional mobility.     Status  Achieved        PT Long Term Goals - 12/11/17 0850      PT LONG TERM GOAL #1   Title  Pt will verbalize understanding of fall prevention strategies to reduce falls risk. New TARGET DATE  FOR ALL LTGS: 01/10/18    Baseline  Needs max cues to recall any fall prevention strategies. 12/11/17    Status  On-going      PT LONG TERM GOAL #2   Title  Pt will amb. 700' over even/uneven terrain at MOD I level with LRAD to improve safety during functional mobility.     Baseline  PT recommends S for outdoor surfaces, esp past 500' with SPC on 12/11/17.  He is ambulating 2 blocks at home with wife observing from house per pt report.      Status  On-going      PT LONG TERM GOAL #3   Title  Pt will improve DGI score to >/=18/24 to decr. falls risk.     Baseline  15/24 on 12/11/17, only single point improvement from baseline    Status  Revised      PT LONG TERM GOAL #4   Title  Pt will amb. 100' no AD, with S, to amb. safely at home.     Baseline  S in clinic without device 12/11/17    Status  Achieved      PT LONG TERM GOAL #5   Title  Pt will negotiate up/down 12 steps with single rail in reciprocal pattern without compensation at mod I level in order to indicate safe home negotiation.     Time  4    Period  Weeks    Status  New            Plan - 12/22/17 097253  Clinical Impression Statement  Pt demonstrated progress, as he was able to perform reciprocal arm swing after amb. with walking poles without cues. pt continues to experience incr. postural sway during high level balance activities over compliant surfaces without UE support. Rest breaks to ensure LBP did not incr. with pt reporting reduced pain at end of session. Continue with POC.     Rehab Potential  Good    Clinical Impairments Affecting Rehab Potential  see above.     PT Frequency  2x / week with potential to d/c early    PT Duration  4 weeks    PT Treatment/Interventions  ADLs/Self  Care Home Management;Biofeedback;Canalith Repostioning;Electrical Stimulation;Therapeutic exercise;Therapeutic activities;Manual techniques;Vestibular;Orthotic Fit/Training;Functional mobility training;Stair training;Gait  training;Patient/family education;DME Instruction;Neuromuscular re-education;Balance training    PT Next Visit Plan  Monitor BP 2/2 medical hx. continue to work on gait without cane, high level balance, pertubations.     Consulted and Agree with Plan of Care  Patient       Patient will benefit from skilled therapeutic intervention in order to improve the following deficits and impairments:  Abnormal gait, Decreased endurance, Decreased knowledge of use of DME, Decreased strength, Decreased mobility, Decreased balance, Decreased cognition, Impaired flexibility, Postural dysfunction, Decreased safety awareness, Pain(PT will closely monitor pain but will not directly address)  Visit Diagnosis: Other abnormalities of gait and mobility  Unsteadiness on feet  Muscle weakness (generalized)     Problem List Patient Active Problem List   Diagnosis Date Noted  . Spondylosis without myelopathy or radiculopathy, lumbar region 12/04/2017  . Encephalopathy 08/18/2017  . Fever   . SIADH (syndrome of inappropriate ADH production) ([place])   . PAF (paroxysmal atrial fibrillation) ([place])   . Type 2 diabetes mellitus with peripheral neuropathy ([facility])   . Hypokalemia   . Urinary retention   . Acute blood loss anemia   . Hypothyroidism   . Spinal stenosis 08/12/2017  . Multifactorial gait disorder 08/12/2017  . Type 2 diabetes mellitus with other specified complication ([facility]) 33/91/7921  . Hyponatremia 08/12/2017  . Atrial fibrillation ([place]) 01/16/2016  . Rheumatoid arthritis ([place]) 01/16/2016  . History of artificial joint 12/19/2014  . H/O knee surgery 12/19/2014  . Primary osteoarthritis of both knees 10/27/2014  . Osteopenia 03/31/2014  . HEARTBURN 09/04/2009    Jaynie Hitch L 12/22/2017, 9:28 AM  [facility] [address] [place], [place], [address] Phone: [phone]   Fax:  [phone]  Name: [name] MRN:  [id number] Date of Birth: Sep 09, 1940  Geoffry Paradise, PT,DPT 12/22/17 9:28 AM Phone: [phone] Fax: [phone]

## 2017-12-22 NOTE — Telephone Encounter (Signed)
[name] called and reported he talked with the driving school instructor and it will be $700 and he cannot afford that.

## 2017-12-22 NOTE — Telephone Encounter (Addendum)
I have sent the link through myChart to [place].

## 2017-12-22 NOTE — Telephone Encounter (Signed)
I'm not sure why this is a surprised. I mentioned to them both, that the cost would be around $500-600.    Here is a link for other OT driving evaluators in [place]. She can call anyone on this list and check cost.  [email or web address]

## 2017-12-26 ENCOUNTER — Ambulatory Visit: Payer: Medicare Other

## 2017-12-26 DIAGNOSIS — M6281 Muscle weakness (generalized): Secondary | ICD-10-CM

## 2017-12-26 DIAGNOSIS — R2681 Unsteadiness on feet: Secondary | ICD-10-CM

## 2017-12-26 DIAGNOSIS — R2689 Other abnormalities of gait and mobility: Secondary | ICD-10-CM

## 2017-12-26 NOTE — Therapy (Signed)
[facility] [address] [place], [place], [address] Phone: [phone]   Fax:  [phone]  Physical Therapy Treatment  Patient Details  Name: [name] MRN: [id number] Date of Birth: [birth year] Referring Provider: Dr. Naaman Plummer   Encounter Date: 12/26/2017  PT End of Session - 12/26/17 0941    Visit Number  12    Number of Visits  17    Date for PT Re-Evaluation  01/10/18    Authorization Type  UHC Medicare    PT Start Time  0855    PT Stop Time  0919 d/c    PT Time Calculation (min)  24 min    Equipment Utilized During Treatment  -- S prn    Activity Tolerance  Patient tolerated treatment well    Behavior During Therapy  [facility] for tasks assessed/performed       Past Medical History:  Diagnosis Date  . DM (diabetes mellitus) ([place])   . GERD (gastroesophageal reflux disease)   . Hyperlipidemia   . Hypertension   . Legionella pneumonia ([place])   . Osteoarthritis   . Osteopenia   . Rheumatoid arthritis ([place])   . Thyroid disease     Past Surgical History:  Procedure Laterality Date  . COLONOSCOPY    . KNEE SURGERY Bilateral    to clear out arthritis    There were no vitals filed for this visit.  Subjective Assessment - 12/26/17 0858    Subjective  Pt wants today to be his last appt, as he's leaving for vacation on Friday and then will most likely have surgery. Pt able to verbalize fall prevention strategies. "you have helped me a lot."    Pertinent History  A-fib, HTN, DM, hypothyroidism, RA, osteopenia, spinal stenosis (per pt), OA in B knees    Patient Stated Goals  Improve balance and walk safely, get back to driving    Currently in Pain?  Yes    Pain Score  6     Pain Location  Back    Pain Orientation  Lower    Pain Descriptors / Indicators  Aching    Pain Type  Chronic pain    Pain Onset  More than a month ago    Pain Frequency  Constant    Aggravating Factors   walking, sit to stand txf     Pain Relieving Factors  tylenol, pain patch                       [facility] Adult PT Treatment/Exercise - 12/26/17 0906      Ambulation/Gait   Ambulation/Gait  Yes    Ambulation/Gait Assistance  6: Modified independent (Device/Increase time);5: Supervision;7: Independent    Ambulation/Gait Assistance Details  MOD I with SPC, and progressed from S to IND without cane indoors (cues to improved arm swing).     Ambulation Distance (Feet)  700 Feet 75'x2    Assistive device  None;Straight cane    Gait Pattern  Trendelenburg;Step-through pattern    Ambulation Surface  Level;Unlevel;Indoor;Outdoor;Paved      Standardized Balance Assessment   Standardized Balance Assessment  Dynamic Gait Index      Dynamic Gait Index   Level Surface  Mild Impairment    Change in Gait Speed  Mild Impairment    Gait with Horizontal Head Turns  Normal    Gait with Vertical Head Turns  Normal    Gait and Pivot Turn  Normal  Step Over Obstacle  Normal    Step Around Obstacles  Normal    Steps  Mild Impairment    Total Score  21            Self Care: PT Education - 12/26/17 0940    Education provided  Yes    Education Details  PT discussed goal progress and that pt will need a new order after surgery but that acute PT will determine best POC/facility. PT also reviewed HEP and provided guidance on frequency s/p PT.     Person(s) Educated  Patient    Methods  Explanation    Comprehension  Verbalized understanding       PT Short Term Goals - 11/17/17 0930      PT SHORT TERM GOAL #1   Title  Pt will be IND in HEP in order to improve strength, balance, and safety. TARGET DATE FOR ALL LTGS: 11/10/17    Baseline  required cues and demo on 11/17/17    Status  Partially Met      PT SHORT TERM GOAL #2   Title  Pt will improve DGI score to >/=15/24 to decr. falls risk.     Baseline  14/24 on 11/07/17    Status  Partially Met      PT SHORT TERM GOAL #3   Title  Pt will improve gait speed  with LRAD to >/=2.73f/sec. in order to safely amb. in the community.     Baseline  2.039fsec. with SPC limited by LBP    Status  Not Met      PT SHORT TERM GOAL #4   Title  Pt will amb. 500' with LRAD at MOD I level over even terrain to improve safety during functional mobility.     Status  Achieved        PT Long Term Goals - 12/26/17 097342    PT LONG TERM GOAL #1   Title  Pt will verbalize understanding of fall prevention strategies to reduce falls risk. New TARGET DATE FOR ALL LTGS: 01/10/18    Status  Achieved      PT LONG TERM GOAL #2   Title  Pt will amb. 700' over even/uneven terrain at MOD I level with LRAD to improve safety during functional mobility.     Status  Achieved      PT LONG TERM GOAL #3   Title  Pt will improve DGI score to >/=18/24 to decr. falls risk.     Status  Achieved      PT LONG TERM GOAL #4   Title  Pt will amb. 100' no AD, with S, to amb. safely at home.     Status  Achieved      PT LONG TERM GOAL #5   Title  Pt will negotiate up/down 12 steps with single rail in reciprocal pattern without compensation at mod I level in order to indicate safe home negotiation.     Time  4    Period  Weeks    Status  Partially Met            Plan - 12/26/17 0941    Clinical Impression Statement  Pt met LTGs 1-4 and partially met LTG 5, as he required step to gait pattern to descend stairs. Pt is d/c today per pt request as he's leaving for vacation. Please see d/c summary for details.     Rehab Potential  Good    Clinical Impairments Affecting  Rehab Potential  see above.     PT Frequency  2x / week with potential to d/c early    PT Duration  4 weeks    PT Treatment/Interventions  ADLs/Self Care Home Management;Biofeedback;Canalith Repostioning;Electrical Stimulation;Therapeutic exercise;Therapeutic activities;Manual techniques;Vestibular;Orthotic Fit/Training;Functional mobility training;Stair training;Gait training;Patient/family education;DME  Instruction;Neuromuscular re-education;Balance training    PT Next Visit Plan  d/c    Consulted and Agree with Plan of Care  Patient       Patient will benefit from skilled therapeutic intervention in order to improve the following deficits and impairments:  Abnormal gait, Decreased endurance, Decreased knowledge of use of DME, Decreased strength, Decreased mobility, Decreased balance, Decreased cognition, Impaired flexibility, Postural dysfunction, Decreased safety awareness, Pain(PT will closely monitor pain but will not directly address)  Visit Diagnosis: Other abnormalities of gait and mobility  Unsteadiness on feet  Muscle weakness (generalized)     Problem List Patient Active Problem List   Diagnosis Date Noted  . Spondylosis without myelopathy or radiculopathy, lumbar region 12/04/2017  . Encephalopathy 08/18/2017  . Fever   . SIADH (syndrome of inappropriate ADH production) ([place])   . PAF (paroxysmal atrial fibrillation) ([place])   . Type 2 diabetes mellitus with peripheral neuropathy ([facility])   . Hypokalemia   . Urinary retention   . Acute blood loss anemia   . Hypothyroidism   . Spinal stenosis 08/12/2017  . Multifactorial gait disorder 08/12/2017  . Type 2 diabetes mellitus with other specified complication ([place]) 31/04/1623  . Hyponatremia 08/12/2017  . Atrial fibrillation ([place]) 01/16/2016  . Rheumatoid arthritis ([place]) 01/16/2016  . History of artificial joint 12/19/2014  . H/O knee surgery 12/19/2014  . Primary osteoarthritis of both knees 10/27/2014  . Osteopenia 03/31/2014  . HEARTBURN 09/04/2009    Torra Pala L 12/26/2017, 9:43 AM  [facility] [address] [place], [place], [address] Phone: [phone]   Fax:  [phone]  Name: ANDRIUS ANDREPONT MRN: [id number] Date of Birth: [birth year]  Geoffry Paradise, PT,DPT 12/26/17 9:43 AM Phone: [phone] Fax: [phone]

## 2017-12-27 ENCOUNTER — Telehealth: Payer: Self-pay | Admitting: *Deleted

## 2017-12-27 NOTE — Telephone Encounter (Signed)
Mr Dall called and is requesting we send his medical information to [facility] ph # [phone], in [place].  The fax # is [phone] attn [place].  I have notified him he will have to sign a release first.

## 2017-12-27 NOTE — Telephone Encounter (Signed)
Faxed clinicals to [facility] [phone] attn [place] 12/27/17.   Thanks, Vilinda Blanks

## 2017-12-29 ENCOUNTER — Ambulatory Visit: Payer: Medicare Other | Admitting: Rehabilitation

## 2018-01-03 ENCOUNTER — Ambulatory Visit: Payer: Medicare Other

## 2018-01-05 ENCOUNTER — Ambulatory Visit: Payer: Medicare Other

## 2018-01-30 ENCOUNTER — Telehealth: Payer: Self-pay

## 2018-01-30 ENCOUNTER — Other Ambulatory Visit: Payer: Self-pay | Admitting: Rheumatology

## 2018-01-30 DIAGNOSIS — G8929 Other chronic pain: Secondary | ICD-10-CM

## 2018-01-30 DIAGNOSIS — M545 Low back pain: Principal | ICD-10-CM

## 2018-01-30 NOTE — Telephone Encounter (Signed)
Pt called stating that it was mentioned in last visit that an Epidural injection can be done in this office. He is requesting an Epidural injection. Is this okay to schedule?

## 2018-01-31 NOTE — Telephone Encounter (Signed)
Recommend L2-3 translaminar paracentral to right per Dr. Letta Pate

## 2018-02-01 NOTE — Telephone Encounter (Signed)
Scheduled for 02/09/18 @9 :15 with Dr Letta Pate.  Pre procedure instructions reviewed over phone with acknowledgment of understanding. He will stop his eliquis 3 days prior to procedure. Instructions mailed to his home as well.

## 2018-02-06 ENCOUNTER — Telehealth: Payer: Self-pay | Admitting: *Deleted

## 2018-02-06 NOTE — Telephone Encounter (Signed)
I called him back.  It is a 5 mg dosepak (48 tablets) that he started on 02/02/18.  Please advise.

## 2018-02-06 NOTE — Telephone Encounter (Signed)
Please proceed with injection

## 2018-02-06 NOTE — Telephone Encounter (Signed)
I do not see steroids/prednisone on his medication list.  Generally speaking if somebody is on chronic low-dose prednisone we can still do interlaminar injections.  If he is starting a high dose burst and taper I would not recommend waiting

## 2018-02-06 NOTE — Telephone Encounter (Signed)
[name] is asking about him taking prednisone and getting an injection.  I dont see it on his list but he says he is taking it and wants to know if that will be ok with the injection (translaminar) Please advise.

## 2018-02-07 NOTE — Telephone Encounter (Signed)
Notified Mr Griffing.

## 2018-02-08 ENCOUNTER — Telehealth: Payer: Self-pay | Admitting: *Deleted

## 2018-02-08 NOTE — Telephone Encounter (Signed)
Patient left a message 02/08/2018 stating he has an epidyral steroid injection scheduled for tomorrow at 9:30 am.  He reports his blood sugar reading this morning at 200.  He is asking if the injection is appropriate given his blood sugar reading and the increase that would happen as a result of steroids....please advise

## 2018-02-08 NOTE — Telephone Encounter (Signed)
Patient contacted and advised to increase glucophage.  He was encouraged to take a reading tomorrow and as long as blood sugar remains below 250 we will proceed.  Patient verbalized understanding

## 2018-02-08 NOTE — Telephone Encounter (Signed)
I would recommend increasing his glucophage XR to 1053m daily starting today and watch CBG's closely. He needs better control, but this shouldn't be a contraindication to the injection.

## 2018-02-08 NOTE — Telephone Encounter (Signed)
Ok if CBG <250

## 2018-02-08 NOTE — Telephone Encounter (Signed)
LVM for patient to call us back to receive instructions regarding h9is injection tomorrow

## 2018-02-09 ENCOUNTER — Encounter: Payer: Medicare Other | Attending: Registered Nurse

## 2018-02-09 ENCOUNTER — Other Ambulatory Visit: Payer: Self-pay

## 2018-02-09 ENCOUNTER — Ambulatory Visit: Payer: Medicare Other | Admitting: Physical Medicine & Rehabilitation

## 2018-02-09 VITALS — BP 143/83 | HR 58 | Ht 67.0 in | Wt 160.0 lb

## 2018-02-09 DIAGNOSIS — K219 Gastro-esophageal reflux disease without esophagitis: Secondary | ICD-10-CM | POA: Diagnosis not present

## 2018-02-09 DIAGNOSIS — M4727 Other spondylosis with radiculopathy, lumbosacral region: Secondary | ICD-10-CM

## 2018-02-09 DIAGNOSIS — Z7901 Long term (current) use of anticoagulants: Secondary | ICD-10-CM | POA: Insufficient documentation

## 2018-02-09 DIAGNOSIS — E871 Hypo-osmolality and hyponatremia: Secondary | ICD-10-CM | POA: Insufficient documentation

## 2018-02-09 DIAGNOSIS — I10 Essential (primary) hypertension: Secondary | ICD-10-CM | POA: Diagnosis not present

## 2018-02-09 DIAGNOSIS — E119 Type 2 diabetes mellitus without complications: Secondary | ICD-10-CM | POA: Diagnosis not present

## 2018-02-09 DIAGNOSIS — M5417 Radiculopathy, lumbosacral region: Secondary | ICD-10-CM | POA: Diagnosis not present

## 2018-02-09 DIAGNOSIS — E785 Hyperlipidemia, unspecified: Secondary | ICD-10-CM | POA: Diagnosis not present

## 2018-02-09 DIAGNOSIS — G9340 Encephalopathy, unspecified: Secondary | ICD-10-CM | POA: Insufficient documentation

## 2018-02-09 DIAGNOSIS — M8580 Other specified disorders of bone density and structure, unspecified site: Secondary | ICD-10-CM | POA: Insufficient documentation

## 2018-02-09 DIAGNOSIS — R269 Unspecified abnormalities of gait and mobility: Secondary | ICD-10-CM | POA: Diagnosis not present

## 2018-02-09 DIAGNOSIS — E079 Disorder of thyroid, unspecified: Secondary | ICD-10-CM | POA: Insufficient documentation

## 2018-02-09 DIAGNOSIS — M069 Rheumatoid arthritis, unspecified: Secondary | ICD-10-CM | POA: Diagnosis not present

## 2018-02-09 DIAGNOSIS — I480 Paroxysmal atrial fibrillation: Secondary | ICD-10-CM | POA: Diagnosis not present

## 2018-02-09 DIAGNOSIS — Z87891 Personal history of nicotine dependence: Secondary | ICD-10-CM | POA: Diagnosis not present

## 2018-02-09 DIAGNOSIS — Z09 Encounter for follow-up examination after completed treatment for conditions other than malignant neoplasm: Secondary | ICD-10-CM | POA: Diagnosis not present

## 2018-02-09 NOTE — Progress Notes (Signed)
Right L2-3 Lumbar epidural steroid injection under fluoroscopic guidance  Indication: Lumbosacral radiculitis is not relieved by medication management or other conservative care and interfering with self-care and mobility.  No  anticoagulant use. Off Eliquis x 3 days  Informed consent was obtained after describing risk and benefits of the procedure with the patient, this includes bleeding, bruising, infection, paralysis and medication side effects.  The patient wishes to proceed and has given written consent.  Patient was placed in a prone position.  The lumbar area was marked and prepped with Betadine.  It was entered with a 25-gauge 1-1/2 inch needle and one mL of 1% lidocaine was injected into the skin and subcutaneous tissue.  Then a 17-gauge spinal needle was inserted under fluoroscopic guidance into the Right L2-3 interlaminar space under AP and Lateral imaging.  Once needle tip of approximated the posterior elements, a loss of resistance technique was utilized with lateral imaging.  A positive loss of resistance was obtained and then confirmed by injecting 2 mL's of Omnipaque 180.  Then a solution containing 1.5 mL's of 62m/ml Celestone and 1.5 mL's of 1% lidocaine was injected.  The patient tolerated procedure well.  Post procedure instructions were given.  Please see post procedure form.  Resume Eliquis in am F/u Dr SNaaman Plummer10/15/19

## 2018-02-09 NOTE — Progress Notes (Signed)
  PROCEDURE RECORD [facility]   Name: [name] DOB:May 20, 1941 MRN: [id number]  Date:02/09/2018  Physician: Alysia Penna, MD    Nurse/CMA: Truman Hayward, CMA  Allergies:  Allergies  Allergen Reactions  . Hydrocodone Nausea Only    Consent Signed: Yes.    Is patient diabetic? Yes.    CBG today? 153 Pregnant: No. LMP: No LMP for male patient. (age 77-55)  Anticoagulants: yes (eliquis-stopped for injection) Anti-inflammatory: no Antibiotics: no  Procedure: Translaminar Position: Prone Start Time: 9:49am End Time: 9:54am Fluoro Time: 23  RN/CMA Truman Hayward, CMA Meena Barrantes, CMA    Time 9:11am 9:58am    BP 143/83 135/79    Pulse 57 60    Respirations 16 16    O2 Sat 98 97    S/S 6 6    Pain Level 7/10 4/10     D/C home with wife , patient A & O X 3, D/C instructions reviewed, and sits independently.

## 2018-02-09 NOTE — Patient Instructions (Signed)
You received an epidural steroid injection under fluoroscopic guidance. This is the most accurate way to perform an epidural injection. This injection was performed to relieve thigh or leg or foot pain that may be related to a pinched nerve in the lumbar spine. The local anesthetic injected today may cause numbness in your leg for a couple hours. If it is severe we may need to observe you for 30-60 minutes after the injection. The cortisone medicine injected today may take several days to take full effect. This medicine can also cause facial flushing or feeling of being warm.  This injection may last for days weeks or months. It can be repeated if needed. If it is not effective, another spinal level may need to be injected. Other treatments include medication management as well as physical therapy. In some cases surgery may be an option.  May restart Eliquis tomorrow

## 2018-03-06 ENCOUNTER — Encounter: Payer: Self-pay | Admitting: Physical Medicine & Rehabilitation

## 2018-03-06 ENCOUNTER — Other Ambulatory Visit: Payer: Self-pay

## 2018-03-06 ENCOUNTER — Encounter: Payer: Medicare Other | Attending: Registered Nurse | Admitting: Physical Medicine & Rehabilitation

## 2018-03-06 VITALS — BP 121/79 | HR 66 | Ht 67.0 in | Wt 162.0 lb

## 2018-03-06 DIAGNOSIS — E119 Type 2 diabetes mellitus without complications: Secondary | ICD-10-CM | POA: Insufficient documentation

## 2018-03-06 DIAGNOSIS — R269 Unspecified abnormalities of gait and mobility: Secondary | ICD-10-CM | POA: Diagnosis not present

## 2018-03-06 DIAGNOSIS — Z09 Encounter for follow-up examination after completed treatment for conditions other than malignant neoplasm: Secondary | ICD-10-CM | POA: Diagnosis not present

## 2018-03-06 DIAGNOSIS — I10 Essential (primary) hypertension: Secondary | ICD-10-CM | POA: Insufficient documentation

## 2018-03-06 DIAGNOSIS — E871 Hypo-osmolality and hyponatremia: Secondary | ICD-10-CM | POA: Insufficient documentation

## 2018-03-06 DIAGNOSIS — R2689 Other abnormalities of gait and mobility: Secondary | ICD-10-CM | POA: Diagnosis not present

## 2018-03-06 DIAGNOSIS — I480 Paroxysmal atrial fibrillation: Secondary | ICD-10-CM | POA: Insufficient documentation

## 2018-03-06 DIAGNOSIS — Z7901 Long term (current) use of anticoagulants: Secondary | ICD-10-CM | POA: Insufficient documentation

## 2018-03-06 DIAGNOSIS — Z87891 Personal history of nicotine dependence: Secondary | ICD-10-CM | POA: Diagnosis not present

## 2018-03-06 DIAGNOSIS — M8580 Other specified disorders of bone density and structure, unspecified site: Secondary | ICD-10-CM | POA: Insufficient documentation

## 2018-03-06 DIAGNOSIS — M069 Rheumatoid arthritis, unspecified: Secondary | ICD-10-CM | POA: Diagnosis not present

## 2018-03-06 DIAGNOSIS — M4727 Other spondylosis with radiculopathy, lumbosacral region: Secondary | ICD-10-CM | POA: Diagnosis not present

## 2018-03-06 DIAGNOSIS — M7918 Myalgia, other site: Secondary | ICD-10-CM | POA: Diagnosis not present

## 2018-03-06 DIAGNOSIS — E785 Hyperlipidemia, unspecified: Secondary | ICD-10-CM | POA: Insufficient documentation

## 2018-03-06 DIAGNOSIS — E079 Disorder of thyroid, unspecified: Secondary | ICD-10-CM | POA: Insufficient documentation

## 2018-03-06 DIAGNOSIS — K219 Gastro-esophageal reflux disease without esophagitis: Secondary | ICD-10-CM | POA: Diagnosis not present

## 2018-03-06 DIAGNOSIS — G9340 Encephalopathy, unspecified: Secondary | ICD-10-CM | POA: Diagnosis not present

## 2018-03-06 MED ORDER — CYCLOBENZAPRINE HCL 5 MG PO TABS: 5.0000 mg | ORAL_TABLET | Freq: Three times a day (TID) | ORAL | 1 refills | Status: DC | PRN

## 2018-03-06 NOTE — Patient Instructions (Signed)
PLEASE FEEL FREE TO CALL OUR OFFICE WITH ANY PROBLEMS OR QUESTIONS ([phone])      

## 2018-03-06 NOTE — Progress Notes (Signed)
Subjective:    Patient ID: [name], male    DOB: 04/08/1941, 77 y.o.   MRN: [id number]  HPI   The patient is here in follow-up of his chronic gait disorder as well as low back pain.  He had increasing back pain with right leg symptoms.  Ultimately sent him to Dr. Letta Pate for a right L2-L3 lumbar epidural steroid injection which was performed on 02/09/2018. He reports relief almost complete relief for 3 days, and then the pain began to come back again involving the low back with radiation to his knees.  He has seen neurosurgery who is not recommending surgical intervention at this point.  He has had physical therapy in the past but focus is not been on the back but on balance and gait  He also has noted over the last several days and pain in the left shoulder area.  It has been uncomfortable to perform activities with his left arm over the head.  He has not found much to relieve the pain.  Lidoderm patches and his salonpas helped somewhat with his shoulder pain as well as the low back symptoms.  Pain Inventory Average Pain 8 Pain Right Now 8 My pain is sharp  In the last 24 hours, has pain interfered with the following? General activity 7 Relation with others 0 Enjoyment of life 0 What TIME of day is your pain at its worst? daytime evening night Sleep (in general) Fair  Pain is worse with: walking, bending and standing Pain improves with: n/a Relief from Meds: n/a  Mobility use a cane ability to climb steps?  yes do you drive?  yes  Function retired  Neuro/Psych No problems in this area  Prior Studies Any changes since last visit?  no  Physicians involved in your care Primary care Dr. Ashby Dawes Rheumatologist Arial Vikram   Family History  Problem Relation Age of Onset  . Diabetes Mellitus I Father   . Diabetes Mellitus I Mother   . CAD Brother    Social History   Socioeconomic History  . Marital status: Married    Spouse name: Not on file  .  Number of children: Not on file  . Years of education: Not on file  . Highest education level: Not on file  Occupational History  . Not on file  Social Needs  . Financial resource strain: Not on file  . Food insecurity:    Worry: Not on file    Inability: Not on file  . Transportation needs:    Medical: Not on file    Non-medical: Not on file  Tobacco Use  . Smoking status: Former Smoker    Types: Cigarettes    Last attempt to quit: 11/02/2014    Years since quitting: 3.3  . Smokeless tobacco: Never Used  Substance and Sexual Activity  . Alcohol use: Not Currently    Alcohol/week: 0.0 standard drinks  . Drug use: No  . Sexual activity: Not on file  Lifestyle  . Physical activity:    Days per week: Not on file    Minutes per session: Not on file  . Stress: Not on file  Relationships  . Social connections:    Talks on phone: Not on file    Gets together: Not on file    Attends religious service: Not on file    Active member of club or organization: Not on file    Attends meetings of clubs or organizations: Not on file  Relationship status: Not on file  Other Topics Concern  . Not on file  Social History Narrative   Married   Retired   Scientist, physiological- PhD   Past Surgical History:  Procedure Laterality Date  . COLONOSCOPY    . KNEE SURGERY Bilateral    to clear out arthritis   Past Medical History:  Diagnosis Date  . DM (diabetes mellitus) ([place])   . GERD (gastroesophageal reflux disease)   . Hyperlipidemia   . Hypertension   . Legionella pneumonia ([place])   . Osteoarthritis   . Osteopenia   . Rheumatoid arthritis ([place])   . Thyroid disease    BP 121/79   Pulse 66   Ht 5' 7"  (1.702 m)   Wt 162 lb (73.5 kg)   SpO2 97%   BMI 25.37 kg/m   Opioid Risk Score:   Fall Risk Score:  `1  Depression screen PHQ 2/9  Depression screen [facility] 2/9 03/06/2018 02/09/2018 10/03/2017  Decreased Interest 0 0 0  Down, Depressed, Hopeless 0 0 0  PHQ - 2 Score 0 0 0    Review  of Systems  Constitutional: Positive for appetite change.  HENT: Negative.   Eyes: Negative.   Respiratory: Negative.   Cardiovascular: Negative.   Gastrointestinal: Negative.   Endocrine: Negative.   Genitourinary: Negative.   Musculoskeletal: Negative.   Skin: Negative.   Allergic/Immunologic: Negative.   Neurological: Negative.   Hematological: Negative.   Psychiatric/Behavioral: Negative.   All other systems reviewed and are negative.      Objective:   Physical Exam General: No acute distress HEENT: EOMI, oral membranes moist Cards: reg rate  Chest: normal effort Abdomen: Soft, NT, ND Skin: dry, intact Extremities: no edema Neuro:Patient has reasonable insight and awareness. Continue to display some short-term memory deficits but overall very functional from a cognitive standpoint cranial nerves 2-12 are intact. Sensory exam is normalexcept for decreased light touch in the feet.Reflexes are 2+ in all 4's. Fine motor coordination is intact. No tremors. Motor function is grossly 5/5.   He has improved nicely.  He is using a small base quad cane for balance and seems to shift weight nicely.  He transfers easily.  He is not impulsive.  Stride length and width are fairly normal. Musculoskeletal: LB TTP, paraspinals tight. Limited lumbar flexion to 50 degrees.  Patient with head forward position, shoulders rounded.  He has noticeable trigger point in the left mid trapezius muscle. Psych:Pt's affect is appropriate. Pt is cooperative      Assessment & Plan:  1. Encephalopathy/ Hyponatremia:Generally has resolved. However patient still with short-term memory deficits in processing delays. I suspect some of this is related to his small vessel disease and cerebral atrophy.              -continue with cane.  HEP 3. PAF: Continue Eliquis: Will need to be held prior to injection 4. 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 5.Myofascial pain left trapezius: After informed consent preparation skin with isopropyl alcohol I injected the left mid trapezius with 2 cc 1% lidocaine.  Patient tolerated well without any adverse effects.  Did experience some relief immediately after the injection.  Area was cleaned and dressed prior to leaving the office.  -Provided the patient Pilates information regarding posture and head forward positioning 6. Low back pain/spondylosis.     -pt had complete relief for 3 days after ESI   -REPEAT L2-3 right translaminar per Dr.  Kirsteins  -will send for low back PT after injections completed   15 minutesof face to face patient care time was spent during this visit. All questions were encouraged and answered.I will see the patient back after injection and potential therapy.

## 2018-03-20 ENCOUNTER — Ambulatory Visit: Payer: Medicare Other | Admitting: Physical Medicine & Rehabilitation

## 2018-03-20 ENCOUNTER — Encounter: Payer: Self-pay | Admitting: Physical Medicine & Rehabilitation

## 2018-03-20 ENCOUNTER — Ambulatory Visit: Payer: Medicare Other

## 2018-03-20 VITALS — BP 98/62 | HR 67 | Ht 67.0 in | Wt 159.0 lb

## 2018-03-20 DIAGNOSIS — M5416 Radiculopathy, lumbar region: Secondary | ICD-10-CM | POA: Diagnosis not present

## 2018-03-20 DIAGNOSIS — Z09 Encounter for follow-up examination after completed treatment for conditions other than malignant neoplasm: Secondary | ICD-10-CM | POA: Diagnosis not present

## 2018-03-20 NOTE — Patient Instructions (Signed)

## 2018-03-20 NOTE — Progress Notes (Signed)
Right L2-3 Lumbar epidural steroid injection under fluoroscopic guidance  Indication: Lumbosacral radiculitis is not relieved by medication management or other conservative care and interfering with self-care and mobility.  No  anticoagulant use. Off Eliquis x 3 days  Informed consent was obtained after describing risk and benefits of the procedure with the patient, this includes bleeding, bruising, infection, paralysis and medication side effects.  The patient wishes to proceed and has given written consent.  Patient was placed in a prone position.  The lumbar area was marked and prepped with Betadine.  It was entered with a 25-gauge 1-1/2 inch needle and one mL of 1% lidocaine was injected into the skin and subcutaneous tissue.  Then a 17-gauge spinal needle was inserted under fluoroscopic guidance into the Right L2-3 interlaminar space under AP and Lateral imaging.  Once needle tip of approximated the posterior elements, a loss of resistance technique was utilized with lateral imaging.  A positive loss of resistance was obtained and then confirmed by injecting 2 mL's of Omnipaque 180.  Then a solution containing 1.5 mL's of 23m/ml Celestone and 1.5 mL's of 1% lidocaine was injected.  The patient tolerated procedure well.  Post procedure instructions were given.  Please see post procedure form.  Resume Eliquis in am F/u Dr SNaaman Plummer If having more Left LE symptoms consider Left L3-4 ESI

## 2018-03-20 NOTE — Progress Notes (Signed)
  PROCEDURE RECORD [facility]   Name: [name] DOB:1940-08-27 MRN: [id number]  Date:03/20/2018  Physician: Alysia Penna, MD    Nurse/CMA: Bright CMA  Allergies:  Allergies  Allergen Reactions  . Hydrocodone Nausea Only    Consent Signed: Yes.    Is patient diabetic? Yes.    CBG today? 112  Pregnant: No. LMP: No LMP for male patient. (age 77-55)  Anticoagulants: yes (eliquis, 3-4 days ago) Anti-inflammatory: no Antibiotics: no  Procedure: Right L2-3 Translaminar Epidural Steroid injection   Position: Prone   Start Time: 10:19am End Time: 10:25am Fluoro Time: 17s   RN/CMA Bright CMA Bright CMA    Time 9:42am 10:31    BP 98/62 115/62    Pulse 67 70    Respirations 16 16    O2 Sat 97 97    S/S 6 6    Pain Level 9/10 0/10     D/C home with Wife, patient A & O X 3, D/C instructions reviewed, and sits independently.

## 2018-04-30 ENCOUNTER — Encounter: Payer: Medicare Other | Admitting: Physical Medicine & Rehabilitation

## 2018-05-07 ENCOUNTER — Ambulatory Visit: Payer: Medicare Other | Admitting: Diagnostic Neuroimaging

## 2018-07-05 ENCOUNTER — Other Ambulatory Visit: Payer: Self-pay | Admitting: Internal Medicine

## 2018-07-05 DIAGNOSIS — G8929 Other chronic pain: Secondary | ICD-10-CM

## 2018-07-05 DIAGNOSIS — M545 Low back pain: Principal | ICD-10-CM

## 2018-07-16 ENCOUNTER — Ambulatory Visit
Admission: RE | Admit: 2018-07-16 | Discharge: 2018-07-16 | Disposition: A | Discharge status: Home / Self Care | Payer: Medicare Other | Source: Ambulatory Visit | Attending: Internal Medicine | Admitting: Internal Medicine

## 2018-07-16 DIAGNOSIS — M545 Low back pain, unspecified: Secondary | ICD-10-CM

## 2018-07-16 DIAGNOSIS — G8929 Other chronic pain: Secondary | ICD-10-CM

## 2018-07-16 MED ORDER — IOPAMIDOL (ISOVUE-M 200) INJECTION 41%
1.0000 mL | Freq: Once | INTRAMUSCULAR | Status: AC
  Administered 2018-07-16: 1 mL via EPIDURAL

## 2018-07-16 MED ORDER — METHYLPREDNISOLONE ACETATE 40 MG/ML INJ SUSP (RADIOLOG
120.0000 mg | Freq: Once | INTRAMUSCULAR | Status: AC
  Administered 2018-07-16: 120 mg via EPIDURAL

## 2018-07-16 NOTE — Discharge Instructions (Signed)

## 2018-08-13 ENCOUNTER — Other Ambulatory Visit: Payer: Self-pay | Admitting: Cardiology

## 2018-09-23 ENCOUNTER — Other Ambulatory Visit: Payer: Self-pay | Admitting: Cardiology

## 2018-09-24 ENCOUNTER — Other Ambulatory Visit: Payer: Self-pay | Admitting: Cardiology

## 2018-10-08 ENCOUNTER — Other Ambulatory Visit: Payer: Self-pay | Admitting: Internal Medicine

## 2018-10-08 DIAGNOSIS — G8929 Other chronic pain: Secondary | ICD-10-CM

## 2018-10-08 DIAGNOSIS — M545 Low back pain, unspecified: Secondary | ICD-10-CM

## 2018-10-19 ENCOUNTER — Other Ambulatory Visit: Payer: Self-pay

## 2018-10-19 ENCOUNTER — Ambulatory Visit
Admission: RE | Admit: 2018-10-19 | Discharge: 2018-10-19 | Disposition: A | Discharge status: Home / Self Care | Payer: Medicare Other | Source: Ambulatory Visit | Attending: Internal Medicine | Admitting: Internal Medicine

## 2018-10-19 DIAGNOSIS — M545 Low back pain, unspecified: Secondary | ICD-10-CM

## 2018-10-19 DIAGNOSIS — G8929 Other chronic pain: Secondary | ICD-10-CM

## 2018-10-19 MED ORDER — METHYLPREDNISOLONE ACETATE 40 MG/ML INJ SUSP (RADIOLOG
120.0000 mg | Freq: Once | INTRAMUSCULAR | Status: AC
  Administered 2018-10-19: 10:00:00 120 mg via EPIDURAL

## 2018-10-19 MED ORDER — IOPAMIDOL (ISOVUE-M 200) INJECTION 41%
1.0000 mL | Freq: Once | INTRAMUSCULAR | Status: AC
  Administered 2018-10-19: 10:00:00 1 mL via EPIDURAL

## 2018-10-19 NOTE — Discharge Instructions (Signed)

## 2018-10-23 ENCOUNTER — Other Ambulatory Visit: Payer: Self-pay | Admitting: Cardiology

## 2018-10-23 NOTE — Telephone Encounter (Signed)
Please fill

## 2018-10-25 NOTE — Progress Notes (Signed)
Primary Physician/Referring:  Merrilee Seashore, MD  Patient ID: [name], male    DOB: 1940/11/04, 78 y.o.   MRN: [id number]  Chief Complaint  Patient presents with  . Atrial Fibrillation  . Follow-up    3-4 week    HPI: [name]  is a 78 y.o. male  with PAF-on flecainide, hypertension, hyperlipidemia, DM, rheumatoid arthritis, last seen in Oct. 2018.   Patient is doing well and has no complaints today Except for severe sciatic pain in both legs making his physical activity much reduced and also bilateral leg edema.  He was admitted to hospital in 07/2017 with pneumonia, found to have severe hyponatremia. This resolved on further lab testing by his PCP.  He is maintaining sinus rhythm on current therapy. He denies any bleeding issues.  Past Medical History:  Diagnosis Date  . DM (diabetes mellitus) ([place])   . GERD (gastroesophageal reflux disease)   . Hyperlipidemia   . Hypertension   . Legionella pneumonia ([place])   . Osteoarthritis   . Osteopenia   . Rheumatoid arthritis ([place])   . Thyroid disease     Past Surgical History:  Procedure Laterality Date  . COLONOSCOPY    . KNEE SURGERY Bilateral    to clear out arthritis    Social History   Socioeconomic History  . Marital status: Married    Spouse name: Not on file  . Number of children: 2  . Years of education: Not on file  . Highest education level: Not on file  Occupational History  . Not on file  Social Needs  . Financial resource strain: Not on file  . Food insecurity:    Worry: Not on file    Inability: Not on file  . Transportation needs:    Medical: Not on file    Non-medical: Not on file  Tobacco Use  . Smoking status: Former Smoker    Types: Cigarettes    Last attempt to quit: 11/02/2014    Years since quitting: 3.9  . Smokeless tobacco: Never Used  Substance and Sexual Activity  . Alcohol use: Not Currently    Alcohol/week: 0.0 standard drinks  . Drug use: No  .  Sexual activity: Not on file  Lifestyle  . Physical activity:    Days per week: Not on file    Minutes per session: Not on file  . Stress: Not on file  Relationships  . Social connections:    Talks on phone: Not on file    Gets together: Not on file    Attends religious service: Not on file    Active member of club or organization: Not on file    Attends meetings of clubs or organizations: Not on file    Relationship status: Not on file  . Intimate partner violence:    Fear of current or ex partner: Not on file    Emotionally abused: Not on file    Physically abused: Not on file    Forced sexual activity: Not on file  Other Topics Concern  . Not on file  Social History Narrative   Married   Retired   Scientist, physiological- PhD    Current Outpatient Medications on File Prior to Visit  Medication Sig Dispense Refill  . acetaminophen (TYLENOL) 325 MG tablet Take 1-2 tablets (325-650 mg total) by mouth every 4 (four) hours as needed for mild pain.    Marland Kitchen ELIQUIS 5 MG TABS tablet Take 1 tablet by mouth twice daily  180 tablet 0  . flecainide (TAMBOCOR) 50 MG tablet Take 1 tablet by mouth twice daily 60 tablet 0  . folic acid (FOLVITE) 1 MG tablet Take 1 tablet (1 mg total) by mouth daily. 30 tablet 0  . levothyroxine (SYNTHROID, LEVOTHROID) 75 MCG tablet Take 75 mcg by mouth daily before breakfast.    . metFORMIN (GLUCOPHAGE-XR) 500 MG 24 hr tablet Take 1 tablet (500 mg total) by mouth daily with breakfast.  0  . methotrexate 50 MG/2ML injection INJECT 0.6 ML SUBCUTANEOUSLY ONCE A WEEK    . metoprolol tartrate (LOPRESSOR) 25 MG tablet Take 1 tablet (25 mg total) by mouth 2 (two) times daily. 60 tablet 0  . Multiple Vitamin (MULTIVITAMIN) tablet Take 1 tablet by mouth daily.    . potassium chloride SA (K-DUR,KLOR-CON) 20 MEQ tablet Take 1 tablet (20 mEq total) by mouth daily. 30 tablet 0  . rosuvastatin (CRESTOR) 10 MG tablet Take 1 tablet (10 mg total) by mouth daily. 30 tablet 0   No current  facility-administered medications on file prior to visit.    Review of Systems  Constitution: Negative for decreased appetite, malaise/fatigue, weight gain and weight loss.  Eyes: Negative for visual disturbance.  Cardiovascular: Positive for leg swelling. Negative for chest pain, claudication, dyspnea on exertion, orthopnea, palpitations and syncope.  Respiratory: Negative for hemoptysis and wheezing.   Endocrine: Negative for cold intolerance and heat intolerance.  Hematologic/Lymphatic: Does not bruise/bleed easily.  Skin: Negative for nail changes.  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 Negative for myalgias.  Gastrointestinal: Negative for abdominal pain, change in bowel habit, nausea and vomiting.  Neurological: Negative for difficulty with concentration, dizziness, focal weakness and headaches.  Psychiatric/Behavioral: Negative for altered mental status and suicidal ideas.  All other systems reviewed and are negative.     Objective  Blood pressure (!) 120/59, pulse 70, height 5' 6"  (1.676 m), weight 164 lb 4.8 oz (74.5 kg), SpO2 99 %. Body mass index is 26.52 kg/m.    Physical Exam  Constitutional: He is oriented to person, place, and time. Vital signs are normal. He appears well-developed and well-nourished.  HENT:  Head: Normocephalic and atraumatic.  Neck: Normal range of motion.  Cardiovascular: Normal rate, regular rhythm, normal heart sounds, intact distal pulses and normal pulses.  2+ bilateral lower extremity pitting edema  Pulmonary/Chest: Effort normal and breath sounds normal. No accessory muscle usage. No respiratory distress.  Abdominal: Soft. Bowel sounds are normal.  Musculoskeletal: Normal range of motion.  Neurological: He is alert and oriented to person, place, and time.  Skin: Skin is warm and dry.  Vitals reviewed.  Radiology: No results found.  Laboratory  examination:   CMP Latest Ref Rng & Units 08/23/2017 08/21/2017 08/19/2017  Glucose 65 - 99 mg/dL 100(H) 91 100(H)  BUN 6 - 20 mg/dL <5(L) 5(L) 5(L)  Creatinine 0.61 - 1.24 mg/dL 0.79 0.82 0.79  Sodium 135 - 145 mmol/L 134(L) 135 134(L)  Potassium 3.5 - 5.1 mmol/L 4.0 3.4(L) 4.0  Chloride 101 - 111 mmol/L 105 106 106  CO2 22 - 32 mmol/L 22 20(L) 18(L)  Calcium 8.9 - 10.3 mg/dL 8.9 8.5(L) 8.3(L)  Total Protein 6.5 - 8.1 g/dL - - 5.1(L)  Total Bilirubin 0.3 - 1.2 mg/dL - - 1.7(H)  Alkaline Phos 38 - 126 U/L - - 60  AST 15 - 41 U/L - - 49(H)  ALT 17 - 63 U/L - -  21   CBC Latest Ref Rng & Units 08/23/2017 08/19/2017 08/18/2017  WBC 4.0 - 10.5 K/uL 7.4 8.8 8.7  Hemoglobin 13.0 - 17.0 g/dL 12.2(L) 11.9(L) 11.6(L)  Hematocrit 39.0 - 52.0 % 35.7(L) 33.2(L) 33.9(L)  Platelets 150 - 400 K/uL 246 228 218   Lipid Panel  No results found for: CHOL, TRIG, HDL, CHOLHDL, VLDL, LDLCALC, LDLDIRECT HEMOGLOBIN A1C Lab Results  Component Value Date   HGBA1C 6.2 (H) 08/13/2017   MPG 131.24 08/13/2017   TSH No results for input(s): TSH in the last 8760 hours.  Cardiac Studies:   Echo 05/20/2015: Left ventricle cavity is normal in size. Normal global wall motion. Normal diastolic filling pattern. Calculated EF 63%. Trace mitral regurgitation.  Trace tricuspid regurgitation. Trace pulmonic regurgitation.  Essentially normal echocardiogram.  Nuclear stress test 05/08/2015: 1. The resting electrocardiogram demonstrated normal sinus rhythm, normal resting conduction and no resting arrhythmias.  Stress EKG is non-diagnostic for ischemia as it a pharmacologic stress using Lexiscan. Stress symptoms included dyspnea. 2. The perfusion imaging study demonstrates mild gut uptake artifact in the inferior wall but no evidence of ischemia or scar.  Left ventricle is normal in size in both rest and stress images.  Left ventricular systolic function calculated by QGS was 61%.  This is a low risk study.  Abdominal aortic  duplex 05/14/2015: Focal plaque noted in the mid and distal aorta.  Normal iliac velocity.  Assessment   PAF (paroxysmal atrial fibrillation) (HCC) CHA2DS2-VASc Score is 4 with yearly risk of stroke of 4%.  HAS-Bled score is 1 and estimated major bleeding in one year is 1 % - Plan: EKG 12-Lead  Type 2 diabetes mellitus with peripheral neuropathy ([place]) - Plan: EKG 12-Lead  Essential hypertension - Plan: EKG 12-Lead  Edema of both legs  EKG 10/26/2018: Normal sinus rhythm at rate of 92 bpm, normal axis, incomplete right bundle branch block.  No evidence of ischemia, normal QT interval.  Low-voltage, axis.  Recommendations:   Patient was here for annual visit and follow-up approximate of fibrillation.  He is maintaining sinus rhythm on flecainide.  No bleeding diathesis.  His diabetes is also well controlled but he does have peripheral neuropathy and also sciatica that is made his life miserable with regard to physical activity.  Advised him to try alternative medicine for relief of pain.  It seems that he is not a surgical candidate.  With regard to hypertension, blood pressure is well controlled.  Leg edema is probably related to venous insufficiency.  We discussed regarding wearing support stockings.  He may need venous insufficiency study.  From heart standpoint though there is no evidence of congestive heart failure.  I'll see him back in a year.  Adrian Prows, MD, [facility] 10/27/2018, 8:46 AM [facility] [place] Pager: [phone] Office: [phone] If no answer Cell [phone]

## 2018-10-26 ENCOUNTER — Other Ambulatory Visit: Payer: Self-pay

## 2018-10-26 ENCOUNTER — Ambulatory Visit: Payer: Medicare Other | Admitting: Cardiology

## 2018-10-26 ENCOUNTER — Encounter: Payer: Self-pay | Admitting: Cardiology

## 2018-10-26 VITALS — BP 120/59 | HR 70 | Ht 66.0 in | Wt 164.3 lb

## 2018-10-26 DIAGNOSIS — I10 Essential (primary) hypertension: Secondary | ICD-10-CM

## 2018-10-26 DIAGNOSIS — E1142 Type 2 diabetes mellitus with diabetic polyneuropathy: Secondary | ICD-10-CM | POA: Diagnosis not present

## 2018-10-26 DIAGNOSIS — R600 Localized edema: Secondary | ICD-10-CM | POA: Diagnosis not present

## 2018-10-26 DIAGNOSIS — I480 Paroxysmal atrial fibrillation: Secondary | ICD-10-CM

## 2018-11-16 ENCOUNTER — Other Ambulatory Visit: Payer: Self-pay | Admitting: Cardiology

## 2018-11-22 ENCOUNTER — Other Ambulatory Visit: Payer: Self-pay | Admitting: Internal Medicine

## 2018-11-22 DIAGNOSIS — G8929 Other chronic pain: Secondary | ICD-10-CM

## 2018-11-23 ENCOUNTER — Other Ambulatory Visit: Payer: Self-pay | Admitting: Cardiology

## 2018-12-07 ENCOUNTER — Other Ambulatory Visit (HOSPITAL_COMMUNITY): Payer: Self-pay | Admitting: Gastroenterology

## 2018-12-07 ENCOUNTER — Other Ambulatory Visit: Payer: Self-pay | Admitting: Gastroenterology

## 2018-12-07 DIAGNOSIS — R188 Other ascites: Secondary | ICD-10-CM

## 2018-12-07 IMAGING — CR DG CHEST 1V PORT
1 series · 1 of 1 positions shown · non-contrast
Comparison: PA and lateral chest x-ray August 13, 2017

CLINICAL DATA: Fever

EXAM:
PORTABLE CHEST 1 VIEW

[AP]
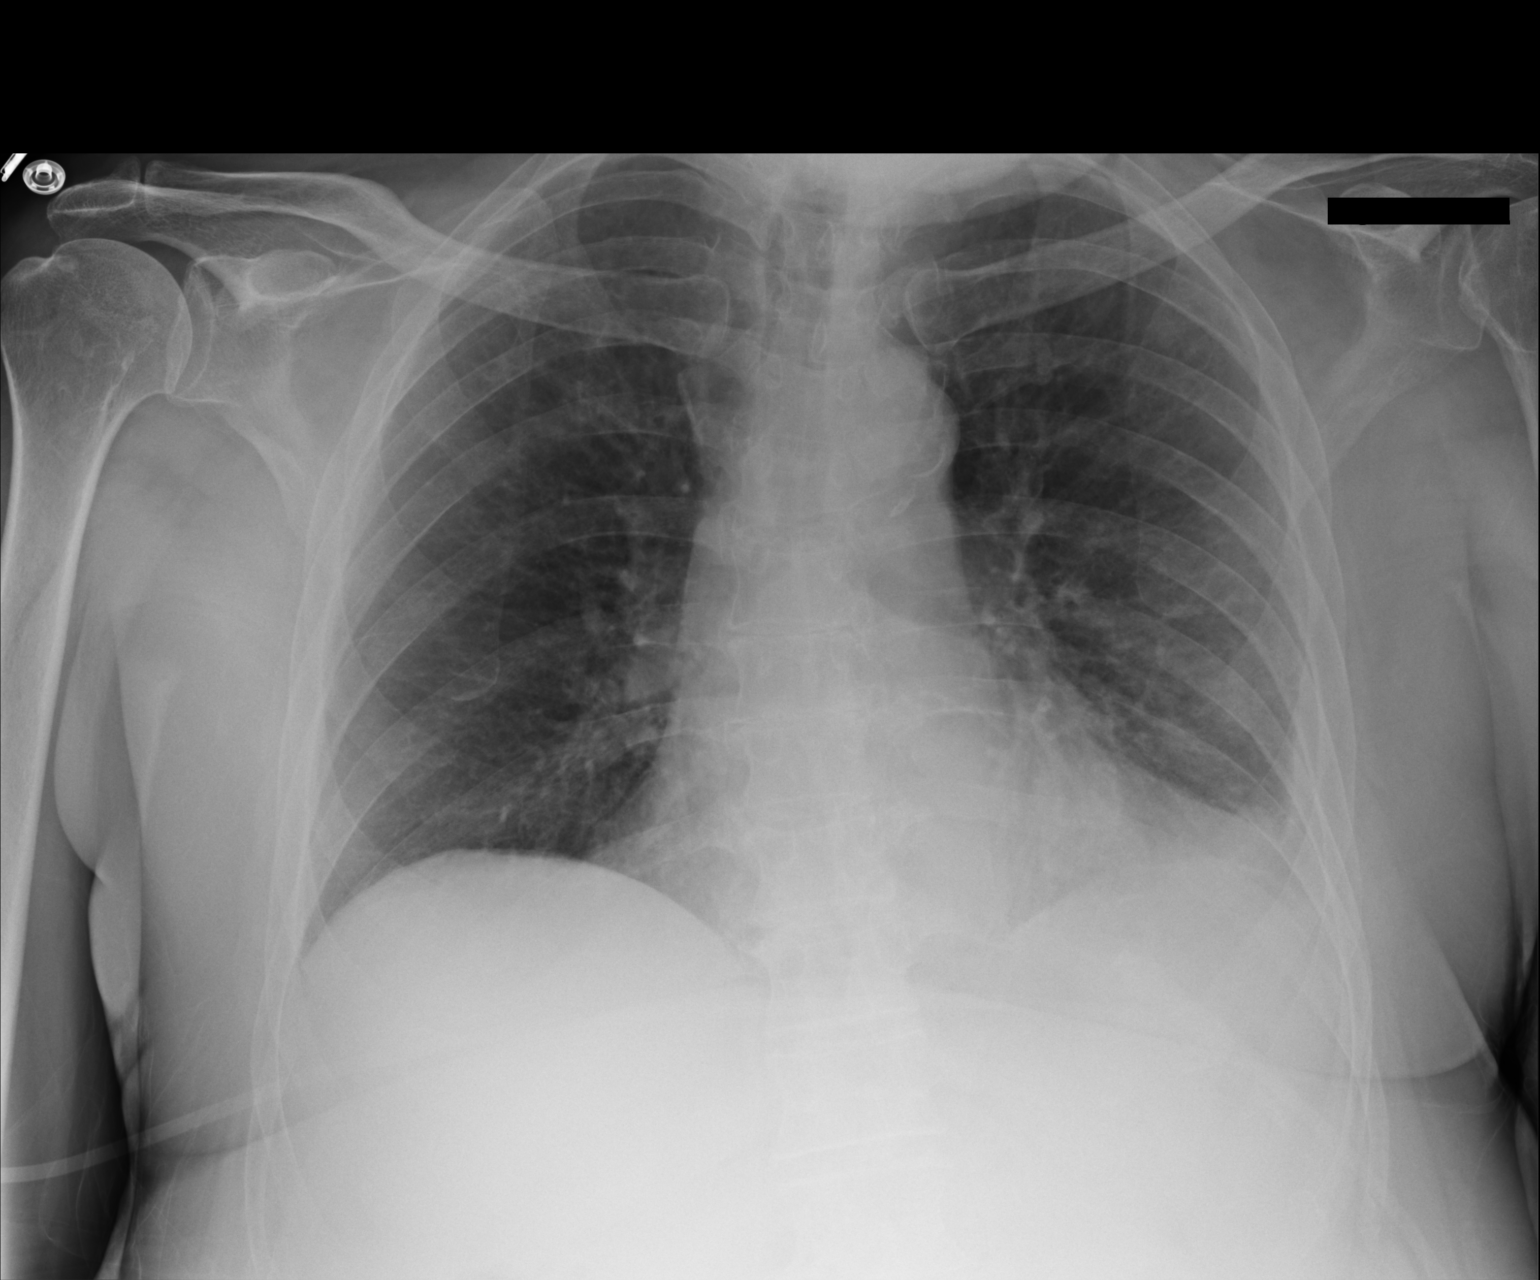

[1 of 1 positions shown; findings below may reference images not displayed]

FINDINGS: There is new hazy increased density at the left lung base. There is
partial obscuration of the left hemidiaphragm. There is a small left
pleural effusion. The right lung is clear. The heart is top-normal
to mildly enlarged. The pulmonary vascularity is normal. There is
calcification in the wall of the aortic arch. The bony thorax
exhibits no acute abnormality.
IMPRESSION: Left lower lobe pneumonia and small left pleural effusion. Followup
PA and lateral chest X-ray is recommended in 3-4 weeks following
trial of antibiotic therapy to ensure resolution and exclude
underlying malignancy.

Thoracic aortic atherosclerosis.

## 2018-12-10 ENCOUNTER — Other Ambulatory Visit: Payer: Medicare Other

## 2018-12-10 ENCOUNTER — Other Ambulatory Visit: Payer: Self-pay

## 2018-12-10 ENCOUNTER — Encounter (HOSPITAL_COMMUNITY): Payer: Self-pay | Admitting: Student

## 2018-12-10 ENCOUNTER — Ambulatory Visit (HOSPITAL_COMMUNITY)
Admission: RE | Admit: 2018-12-10 | Discharge: 2018-12-10 | Disposition: A | Discharge status: Home / Self Care | Payer: Medicare Other | Source: Ambulatory Visit | Attending: Gastroenterology | Admitting: Gastroenterology

## 2018-12-10 DIAGNOSIS — R188 Other ascites: Secondary | ICD-10-CM

## 2018-12-10 HISTORY — PX: IR PARACENTESIS: IMG2679

## 2018-12-10 LAB — BODY FLUID CELL COUNT WITH DIFFERENTIAL
Lymphs, Fluid: 62 %
Monocyte-Macrophage-Serous Fluid: 35 % — ABNORMAL LOW (ref 50–90)
Neutrophil Count, Fluid: 3 % (ref 0–25)
Total Nucleated Cell Count, Fluid: 165 cu mm (ref 0–1000)

## 2018-12-10 LAB — ALBUMIN, PLEURAL OR PERITONEAL FLUID: Albumin, Fluid: <1 g/dL

## 2018-12-10 LAB — GRAM STAIN

## 2018-12-10 LAB — PROTEIN, PLEURAL OR PERITONEAL FLUID: Total protein, fluid: <3 g/dL

## 2018-12-10 MED ORDER — LIDOCAINE HCL (PF) 1 % IJ SOLN
INTRAMUSCULAR | Status: DC | PRN
  Administered 2018-12-10: 10 mL

## 2018-12-10 MED ORDER — LIDOCAINE HCL 1 % IJ SOLN
INTRAMUSCULAR | Status: AC
  Filled 2018-12-10: qty 20

## 2018-12-10 NOTE — Procedures (Signed)
PROCEDURE SUMMARY:  Successful US guided paracentesis from right lateral abdomen.  Yielded 4.0 liters of yellow fluid.  No immediate complications.  Pt tolerated well.   Specimen was sent for labs.  EBL < 90m  KDocia BarrierPA-C 12/10/2018 1:58 PM

## 2018-12-11 LAB — PATHOLOGIST SMEAR REVIEW

## 2018-12-18 ENCOUNTER — Other Ambulatory Visit: Payer: Medicare Other

## 2018-12-21 ENCOUNTER — Other Ambulatory Visit: Payer: Self-pay | Admitting: Cardiology

## 2018-12-23 ENCOUNTER — Inpatient Hospital Stay (HOSPITAL_COMMUNITY)
Admission: EM | Admit: 2018-12-23 | Discharge: 2018-12-26 | DRG: 442 | Disposition: A | Discharge status: Home / Self Care | Payer: Medicare Other | Attending: Family Medicine | Admitting: Family Medicine

## 2018-12-23 ENCOUNTER — Observation Stay (HOSPITAL_COMMUNITY): Payer: Medicare Other

## 2018-12-23 ENCOUNTER — Emergency Department (HOSPITAL_COMMUNITY): Payer: Medicare Other

## 2018-12-23 ENCOUNTER — Encounter (HOSPITAL_COMMUNITY): Payer: Self-pay | Admitting: Internal Medicine

## 2018-12-23 ENCOUNTER — Other Ambulatory Visit: Payer: Self-pay

## 2018-12-23 DIAGNOSIS — I480 Paroxysmal atrial fibrillation: Secondary | ICD-10-CM | POA: Diagnosis not present

## 2018-12-23 DIAGNOSIS — Z833 Family history of diabetes mellitus: Secondary | ICD-10-CM

## 2018-12-23 DIAGNOSIS — Z7901 Long term (current) use of anticoagulants: Secondary | ICD-10-CM

## 2018-12-23 DIAGNOSIS — E871 Hypo-osmolality and hyponatremia: Secondary | ICD-10-CM | POA: Diagnosis present

## 2018-12-23 DIAGNOSIS — Z20828 Contact with and (suspected) exposure to other viral communicable diseases: Secondary | ICD-10-CM | POA: Diagnosis present

## 2018-12-23 DIAGNOSIS — K7581 Nonalcoholic steatohepatitis (NASH): Principal | ICD-10-CM | POA: Diagnosis present

## 2018-12-23 DIAGNOSIS — R9431 Abnormal electrocardiogram [ECG] [EKG]: Secondary | ICD-10-CM | POA: Diagnosis not present

## 2018-12-23 DIAGNOSIS — R188 Other ascites: Secondary | ICD-10-CM | POA: Diagnosis present

## 2018-12-23 DIAGNOSIS — M069 Rheumatoid arthritis, unspecified: Secondary | ICD-10-CM | POA: Diagnosis present

## 2018-12-23 DIAGNOSIS — K8020 Calculus of gallbladder without cholecystitis without obstruction: Secondary | ICD-10-CM | POA: Diagnosis present

## 2018-12-23 DIAGNOSIS — I4891 Unspecified atrial fibrillation: Secondary | ICD-10-CM | POA: Diagnosis present

## 2018-12-23 DIAGNOSIS — Z96653 Presence of artificial knee joint, bilateral: Secondary | ICD-10-CM | POA: Diagnosis present

## 2018-12-23 DIAGNOSIS — N289 Disorder of kidney and ureter, unspecified: Secondary | ICD-10-CM | POA: Diagnosis not present

## 2018-12-23 DIAGNOSIS — E785 Hyperlipidemia, unspecified: Secondary | ICD-10-CM | POA: Diagnosis present

## 2018-12-23 DIAGNOSIS — E876 Hypokalemia: Secondary | ICD-10-CM | POA: Diagnosis present

## 2018-12-23 DIAGNOSIS — Z87891 Personal history of nicotine dependence: Secondary | ICD-10-CM

## 2018-12-23 DIAGNOSIS — E039 Hypothyroidism, unspecified: Secondary | ICD-10-CM | POA: Diagnosis present

## 2018-12-23 DIAGNOSIS — I10 Essential (primary) hypertension: Secondary | ICD-10-CM | POA: Diagnosis present

## 2018-12-23 DIAGNOSIS — E875 Hyperkalemia: Secondary | ICD-10-CM | POA: Diagnosis present

## 2018-12-23 DIAGNOSIS — Z8249 Family history of ischemic heart disease and other diseases of the circulatory system: Secondary | ICD-10-CM

## 2018-12-23 DIAGNOSIS — N179 Acute kidney failure, unspecified: Secondary | ICD-10-CM | POA: Diagnosis present

## 2018-12-23 DIAGNOSIS — R0602 Shortness of breath: Secondary | ICD-10-CM

## 2018-12-23 DIAGNOSIS — K7460 Unspecified cirrhosis of liver: Secondary | ICD-10-CM | POA: Diagnosis present

## 2018-12-23 DIAGNOSIS — Z79899 Other long term (current) drug therapy: Secondary | ICD-10-CM

## 2018-12-23 DIAGNOSIS — Z885 Allergy status to narcotic agent status: Secondary | ICD-10-CM

## 2018-12-23 DIAGNOSIS — E1142 Type 2 diabetes mellitus with diabetic polyneuropathy: Secondary | ICD-10-CM | POA: Diagnosis present

## 2018-12-23 DIAGNOSIS — E1169 Type 2 diabetes mellitus with other specified complication: Secondary | ICD-10-CM | POA: Diagnosis present

## 2018-12-23 DIAGNOSIS — K219 Gastro-esophageal reflux disease without esophagitis: Secondary | ICD-10-CM | POA: Diagnosis present

## 2018-12-23 HISTORY — DX: Unspecified cirrhosis of liver: K74.60

## 2018-12-23 LAB — CBC
HCT: 35 % — ABNORMAL LOW (ref 39.0–52.0)
Hemoglobin: 11.6 g/dL — ABNORMAL LOW (ref 13.0–17.0)
MCH: 30.1 pg (ref 26.0–34.0)
MCHC: 33.1 g/dL (ref 30.0–36.0)
MCV: 90.7 fL (ref 80.0–100.0)
Platelets: 146 10*3/uL — ABNORMAL LOW (ref 150–400)
RBC: 3.86 MIL/uL — ABNORMAL LOW (ref 4.22–5.81)
RDW: 22.4 % — ABNORMAL HIGH (ref 11.5–15.5)
WBC: 6.7 10*3/uL (ref 4.0–10.5)
nRBC: 0 % (ref 0.0–0.2)

## 2018-12-23 LAB — BODY FLUID CELL COUNT WITH DIFFERENTIAL
Eos, Fluid: 0 %
Lymphs, Fluid: 44 %
Monocyte-Macrophage-Serous Fluid: 55 % (ref 50–90)
Neutrophil Count, Fluid: 1 % (ref 0–25)
Total Nucleated Cell Count, Fluid: 74 cu mm (ref 0–1000)

## 2018-12-23 LAB — COMPREHENSIVE METABOLIC PANEL
ALT: 25 U/L (ref 0–44)
AST: 76 U/L — ABNORMAL HIGH (ref 15–41)
Albumin: 2.8 g/dL — ABNORMAL LOW (ref 3.5–5.0)
Alkaline Phosphatase: 75 U/L (ref 38–126)
Anion gap: 13 (ref 5–15)
BUN: 11 mg/dL (ref 8–23)
CO2: 20 mmol/L — ABNORMAL LOW (ref 22–32)
Calcium: 9.5 mg/dL (ref 8.9–10.3)
Chloride: 96 mmol/L — ABNORMAL LOW (ref 98–111)
Creatinine, Ser: 1.47 mg/dL — ABNORMAL HIGH (ref 0.61–1.24)
GFR calc Af Amer: 52 mL/min — ABNORMAL LOW (ref >60)
GFR calc non Af Amer: 45 mL/min — ABNORMAL LOW (ref >60)
Glucose, Bld: 116 mg/dL — ABNORMAL HIGH (ref 70–99)
Potassium: 5.2 mmol/L — ABNORMAL HIGH (ref 3.5–5.1)
Sodium: 129 mmol/L — ABNORMAL LOW (ref 135–145)
Total Bilirubin: 1.8 mg/dL — ABNORMAL HIGH (ref 0.3–1.2)
Total Protein: 6.5 g/dL (ref 6.5–8.1)

## 2018-12-23 LAB — SARS CORONAVIRUS 2 BY RT PCR (HOSPITAL ORDER, PERFORMED IN ~~LOC~~ HOSPITAL LAB): SARS Coronavirus 2: NEGATIVE

## 2018-12-23 LAB — GLUCOSE, CAPILLARY: Glucose-Capillary: 105 mg/dL — ABNORMAL HIGH (ref 70–99)

## 2018-12-23 LAB — GRAM STAIN

## 2018-12-23 LAB — ALBUMIN, PLEURAL OR PERITONEAL FLUID: Albumin, Fluid: <1 g/dL

## 2018-12-23 LAB — PROTIME-INR
INR: 2.3 — ABNORMAL HIGH (ref 0.8–1.2)
Prothrombin Time: 24.9 seconds — ABNORMAL HIGH (ref 11.4–15.2)

## 2018-12-23 LAB — PROTEIN, PLEURAL OR PERITONEAL FLUID: Total protein, fluid: <6 g/dL

## 2018-12-23 LAB — APTT: aPTT: 48 seconds — ABNORMAL HIGH (ref 24–36)

## 2018-12-23 LAB — AMMONIA: Ammonia: 29 umol/L (ref 9–35)

## 2018-12-23 LAB — HEPARIN LEVEL (UNFRACTIONATED): Heparin Unfractionated: >2.2 IU/mL — ABNORMAL HIGH (ref 0.30–0.70)

## 2018-12-23 MED ORDER — FLECAINIDE ACETATE 50 MG PO TABS
50.0000 mg | ORAL_TABLET | Freq: Two times a day (BID) | ORAL | Status: DC
  Administered 2018-12-23 – 2018-12-25 (×4): 50 mg via ORAL
  Filled 2018-12-23 (×5): qty 1

## 2018-12-23 MED ORDER — LIDOCAINE HCL (PF) 1 % IJ SOLN
INTRAMUSCULAR | Status: AC
  Filled 2018-12-23: qty 5

## 2018-12-23 MED ORDER — FOLIC ACID 1 MG PO TABS
1.0000 mg | ORAL_TABLET | Freq: Every day | ORAL | Status: DC
  Administered 2018-12-24 – 2018-12-26 (×3): 1 mg via ORAL
  Filled 2018-12-23 (×3): qty 1

## 2018-12-23 MED ORDER — LEVOTHYROXINE SODIUM 100 MCG PO TABS
100.0000 ug | ORAL_TABLET | Freq: Every day | ORAL | Status: DC
  Administered 2018-12-24 – 2018-12-26 (×3): 100 ug via ORAL
  Filled 2018-12-23 (×3): qty 1

## 2018-12-23 MED ORDER — METOPROLOL TARTRATE 25 MG PO TABS
25.0000 mg | ORAL_TABLET | Freq: Two times a day (BID) | ORAL | Status: DC
  Administered 2018-12-23 – 2018-12-24 (×2): 25 mg via ORAL
  Filled 2018-12-23 (×2): qty 1

## 2018-12-23 MED ORDER — ALBUMIN HUMAN 25 % IV SOLN
50.0000 g | Freq: Once | INTRAVENOUS | Status: AC
  Administered 2018-12-23: 50 g via INTRAVENOUS
  Filled 2018-12-23: qty 200

## 2018-12-23 MED ORDER — ONDANSETRON HCL 4 MG PO TABS: 4.0000 mg | ORAL_TABLET | Freq: Four times a day (QID) | ORAL | Status: DC | PRN

## 2018-12-23 MED ORDER — ONDANSETRON HCL 4 MG/2ML IJ SOLN: 4.0000 mg | Freq: Four times a day (QID) | INTRAMUSCULAR | Status: DC | PRN

## 2018-12-23 MED ORDER — DOCUSATE SODIUM 100 MG PO CAPS
100.0000 mg | ORAL_CAPSULE | Freq: Two times a day (BID) | ORAL | Status: DC
  Administered 2018-12-24 – 2018-12-26 (×5): 100 mg via ORAL
  Filled 2018-12-23 (×6): qty 1

## 2018-12-23 MED ORDER — SPIRONOLACTONE 25 MG PO TABS
25.0000 mg | ORAL_TABLET | Freq: Every day | ORAL | Status: DC
  Administered 2018-12-24: 25 mg via ORAL
  Filled 2018-12-23: qty 1

## 2018-12-23 MED ORDER — HEPARIN (PORCINE) 25000 UT/250ML-% IV SOLN
1100.0000 [IU]/h | INTRAVENOUS | Status: DC
  Filled 2018-12-23: qty 250

## 2018-12-23 MED ORDER — LIDOCAINE HCL (PF) 1 % IJ SOLN
INTRAMUSCULAR | Status: AC
  Filled 2018-12-23: qty 10

## 2018-12-23 MED ORDER — ALBUMIN HUMAN 5 % IV SOLN
50.0000 g | Freq: Once | INTRAVENOUS | Status: DC
  Filled 2018-12-23: qty 1000

## 2018-12-23 MED ORDER — VITAMIN B-12 1000 MCG PO TABS
1000.0000 ug | ORAL_TABLET | Freq: Every day | ORAL | Status: DC
  Administered 2018-12-24 – 2018-12-26 (×3): 1000 ug via ORAL
  Filled 2018-12-23 (×3): qty 1

## 2018-12-23 MED ORDER — INSULIN ASPART 100 UNIT/ML ~~LOC~~ SOLN
0.0000 [IU] | Freq: Three times a day (TID) | SUBCUTANEOUS | Status: DC
  Administered 2018-12-24 (×2): 2 [IU] via SUBCUTANEOUS
  Administered 2018-12-25 – 2018-12-26 (×4): 3 [IU] via SUBCUTANEOUS

## 2018-12-23 MED ORDER — ADULT MULTIVITAMIN W/MINERALS CH
1.0000 | ORAL_TABLET | Freq: Every day | ORAL | Status: DC
  Administered 2018-12-24 – 2018-12-26 (×3): 1 via ORAL
  Filled 2018-12-23 (×3): qty 1

## 2018-12-23 MED ORDER — INSULIN ASPART 100 UNIT/ML ~~LOC~~ SOLN: 0.0000 [IU] | Freq: Every day | SUBCUTANEOUS | Status: DC

## 2018-12-23 MED ORDER — APIXABAN 5 MG PO TABS
5.0000 mg | ORAL_TABLET | Freq: Two times a day (BID) | ORAL | Status: DC
  Administered 2018-12-23 – 2018-12-26 (×6): 5 mg via ORAL
  Filled 2018-12-23 (×6): qty 1

## 2018-12-23 MED ORDER — FUROSEMIDE 40 MG PO TABS
40.0000 mg | ORAL_TABLET | Freq: Every day | ORAL | Status: DC
  Administered 2018-12-24 – 2018-12-26 (×3): 40 mg via ORAL
  Filled 2018-12-23 (×3): qty 1

## 2018-12-23 MED ORDER — ROSUVASTATIN CALCIUM 5 MG PO TABS
10.0000 mg | ORAL_TABLET | Freq: Every day | ORAL | Status: DC
  Administered 2018-12-24 – 2018-12-26 (×3): 10 mg via ORAL
  Filled 2018-12-23 (×3): qty 2

## 2018-12-23 NOTE — ED Notes (Signed)
Pt's son Mohit Mixon: [phone]

## 2018-12-23 NOTE — Progress Notes (Signed)
ANTICOAGULATION CONSULT NOTE - Initial Consult  Pharmacy Consult for heparin Indication: history of atrial fibrillation  Allergies  Allergen Reactions  . Hydrocodone Nausea Only    Patient Measurements: Weight: 164 lb 3.9 oz (74.5 kg) Heparin Dosing Weight: 74.5  Vital Signs: Temp: 98.1 F (36.7 C) (08/02 0922) Temp Source: Oral (08/02 0922) BP: 127/69 (08/02 1300) Pulse Rate: 71 (08/02 1300)  Labs: Recent Labs    12/23/18 1010  HGB 11.6*  HCT 35.0*  PLT 146*  CREATININE 1.47*    Estimated Creatinine Clearance: 37.4 mL/min (A) (by C-G formula based on SCr of 1.47 mg/dL (H)).   Medical History: Past Medical History:  Diagnosis Date  . DM (diabetes mellitus) ([place])   . GERD (gastroesophageal reflux disease)   . Hyperlipidemia   . Hypertension   . Legionella pneumonia ([place])   . Liver cirrhosis secondary to NASH ([place])   . Osteoarthritis   . Osteopenia   . Rheumatoid arthritis ([place])   . Thyroid disease     Assessment: 78 yo male admitted 8/2//2020 with ascites and SOB. Pharmacy consulted to dose heparin for history of Afib. Patient is on apixaban 73m BID PTA. Last dose on 8/2/020 at 0800. Will start heparin when next dose of apixaban is due (8/2 at 2000). Hgb 11.6. No reported bleeding.   Goal of Therapy:  Heparin level 0.3-0.7 units/ml aPTT 66-102 seconds Monitor platelets by anticoagulation protocol: Yes   Plan:  Obtain baseline heparin level and aPTT to assess correlation for therapeutic monitoring Start heparin 1100 units/hr on 8/2 at 2000  Check 8 hr aPTT with morning labs Monitor heparin level, aPTT, CBC, and S/S of bleeding daily   GCristela Felt PharmD PGY1 Pharmacy Resident [employer]: [phone] 12/23/2018,1:36 PM

## 2018-12-23 NOTE — ED Notes (Signed)
[phone] Wife's number

## 2018-12-23 NOTE — ED Notes (Signed)
Patient transported to Ultrasound 

## 2018-12-23 NOTE — ED Triage Notes (Signed)
Pt arrives with [facility] from home c/o [facility] and fluid in abdomen. Pt was evaluated approx 1 week ago for ascities and reports "getting 4 kg of fluid off". Pt states he weighed 173 lbs yesterday and re-weighed self this morning and was 180 lbs. Pt's abdomen is distended and reports SHOB. Pt has hx of afib, DM, hypothyroidism, and takes lasix and eliquis.  EMS vitals:  130/80 HR 70 RR 16 97 temp

## 2018-12-23 NOTE — ED Provider Notes (Addendum)
[facility] EMERGENCY DEPARTMENT Provider Note   CSN: [id number] Arrival date & time: 12/23/18  0915    History   Chief Complaint Chief Complaint  Patient presents with  . ascities  . Shortness of Breath    HPI [name] is a 78 y.o. male with PMH Recently diagnosed cirrhosis, A Fib, DM, HTN, HLD, Hypothyroidism.  Son present during encounter and assists with the history.  Diagnosed with cirrhosis in the last month after having BLE edema.  Has been following with [facility].  S/P paracentesis 7/20 by IR with 4L off, fluid consistent with transudate.  Was scheduled for follow up with GI tomorrow.  This AM, notes that adbomen very distended and having SOB at rest.  Notes weight yesterday was 173lb and this AM was 180.  Endorses chronic, stable BLE edema. No chest pain, fever, cough, congestion, sore throat.  Last drank coffee this AM at 0700.  Has been eating and drinking normally.  Started on spironolactone 2 days ago per son's report and has been compliant with that and Lasix.  Patient also endorses some generalized weakness and fatigue.   Past Medical History:  Diagnosis Date  . DM (diabetes mellitus) ([place])   . GERD (gastroesophageal reflux disease)   . Hyperlipidemia   . Hypertension   . Legionella pneumonia ([place])   . Liver cirrhosis secondary to NASH ([place])   . Osteoarthritis   . Osteopenia   . Rheumatoid arthritis ([place])   . Thyroid disease     Patient Active Problem List   Diagnosis Date Noted  . Spondylosis without myelopathy or radiculopathy, lumbar region 12/04/2017  . Encephalopathy 08/18/2017  . Fever   . SIADH (syndrome of inappropriate ADH production) ([employer])   . PAF (paroxysmal atrial fibrillation) ([place])   . Type 2 diabetes mellitus with peripheral neuropathy (HCC)   . Hypokalemia   . Urinary retention   . Acute blood loss anemia   . Hypothyroidism   . Spinal stenosis 08/12/2017  . Multifactorial gait disorder 08/12/2017  . Type 2  diabetes mellitus with other specified complication ([place]) 27/07/5007  . Hyponatremia 08/12/2017  . Atrial fibrillation ([place]) 01/16/2016  . Rheumatoid arthritis ([place]) 01/16/2016  . History of artificial joint 12/19/2014  . H/O knee surgery 12/19/2014  . Primary osteoarthritis of both knees 10/27/2014  . Osteopenia 03/31/2014  . HEARTBURN 09/04/2009    Past Surgical History:  Procedure Laterality Date  . COLONOSCOPY    . IR PARACENTESIS  12/10/2018  . KNEE SURGERY Bilateral    to clear out arthritis        Home Medications    Prior to Admission medications   Medication Sig Start Date End Date Taking? Authorizing Provider  acetaminophen (TYLENOL) 325 MG tablet Take 1-2 tablets (325-650 mg total) by mouth every 4 (four) hours as needed for mild pain. 08/25/17  Yes Love, Ivan Anchors, PA-C  ELIQUIS 5 MG TABS tablet Take 1 tablet by mouth twice daily Patient taking differently: Take 5 mg by mouth 2 (two) times daily.  11/19/18  Yes Adrian Prows, MD  flecainide [facility]) 50 MG tablet Take 1 tablet by mouth twice daily 11/26/18  Yes Miquel Dunn, NP  folic acid (FOLVITE) 1 MG tablet Take 1 tablet (1 mg total) by mouth daily. 08/25/17  Yes Love, Ivan Anchors, PA-C  furosemide (LASIX) 40 MG tablet Take 40 mg by mouth.   Yes [provider]  levothyroxine (SYNTHROID) 100 MCG tablet Take 100 mcg by mouth  daily before breakfast.    Yes [provider]  metFORMIN (GLUCOPHAGE) 500 MG tablet Take 500-1,000 mg by mouth See admin instructions. Take 1 tablet (554m) by mouth once daily in the morning, and 2 tablets (10045m in the evening   Yes [provider]  metoprolol tartrate (LOPRESSOR) 25 MG tablet Take 1 tablet by mouth twice daily Patient taking differently: Take 25 mg by mouth 2 (two) times daily.  12/21/18  Yes GaAdrian ProwsMD  Multiple Vitamin (MULTIVITAMIN) tablet Take 1 tablet by mouth daily.   Yes [provider]  rosuvastatin (CRESTOR) 10 MG tablet Take 1  tablet (10 mg total) by mouth daily. 08/25/17  Yes Love, PaIvan AnchorsPA-C  spironolactone (ALDACTONE) 25 MG tablet Take 25 mg by mouth daily. 12/13/18  Yes [provider]  vitamin B-12 (CYANOCOBALAMIN) 1000 MCG tablet Take 1,000 mcg by mouth daily.   Yes [provider]  cephALEXin (KEFLEX) 500 MG capsule Take 500 mg by mouth 3 (three) times daily.    [provider]  metFORMIN (GLUCOPHAGE-XR) 500 MG 24 hr tablet Take 1 tablet (500 mg total) by mouth daily with breakfast. Patient not taking: Reported on 12/23/2018 08/25/17   Love, PaIvan AnchorsPA-C  potassium chloride SA (K-DUR,KLOR-CON) 20 MEQ tablet Take 1 tablet (20 mEq total) by mouth daily. Patient not taking: Reported on 12/23/2018 08/26/17   Love, PaIvan AnchorsPA-C    Family History Family History  Problem Relation Age of Onset  . Diabetes Mellitus I Father   . Diabetes Mellitus I Mother   . CAD Brother     Social History Social History   Tobacco Use  . Smoking status: Former Smoker    Packs/day: 0.50    Years: 20.00    Pack years: 10.00    Types: Cigarettes    Quit date: 11/02/2014    Years since quitting: 4.1  . Smokeless tobacco: Never Used  Substance Use Topics  . Alcohol use: Not Currently    Alcohol/week: 0.0 standard drinks    Comment: occasional, no h/o heavy use  . Drug use: No     Allergies   Hydrocodone   Review of Systems Review of Systems  Constitutional: Positive for unexpected weight change. Negative for appetite change, fatigue and fever.  HENT: Negative for congestion and sore throat.   Eyes: Negative for visual disturbance.  Respiratory: Positive for shortness of breath. Negative for cough.   Cardiovascular: Positive for leg swelling. Negative for chest pain and palpitations.  Gastrointestinal: Positive for abdominal distention and abdominal pain. Negative for vomiting.  Genitourinary: Negative for difficulty urinating.  Skin: Negative for color change.  Neurological: Negative for  numbness and headaches.  Psychiatric/Behavioral: Negative for confusion.     Physical Exam Updated Vital Signs BP 105/85   Pulse 68   Temp 98.1 F (36.7 C) (Oral)   Resp 17   SpO2 98%   Physical Exam Constitutional:      General: He is not in acute distress. HENT:     Head: Normocephalic and atraumatic.  Eyes:     Comments: Sclera anicteric  Cardiovascular:     Rate and Rhythm: Normal rate. Rhythm irregular.     Pulses: No decreased pulses.  Pulmonary:     Effort: Pulmonary effort is normal.     Breath sounds: No wheezing.     Comments: Mild bibasilar crackles Abdominal:     Comments: Diffusely distended without TTP, positive bowel sounds  Musculoskeletal:     Right  lower leg: Edema (Trace) present.     Left lower leg: Edema (trace) present.  Skin:    General: Skin is warm and dry.     Comments: Mild facial jaundice, bandage in place on right abdomen without surround erythema, some mild skin breakdown at area of bandage  Neurological:     General: No focal deficit present.     Mental Status: He is alert and oriented to person, place, and time.  Psychiatric:        Mood and Affect: Mood normal.        Behavior: Behavior normal.      ED Treatments / Results  Labs (all labs ordered are listed, but only abnormal results are displayed) Labs Reviewed  COMPREHENSIVE METABOLIC PANEL - Abnormal; Notable for the following components:      Result Value   Sodium 129 (*)    Potassium 5.2 (*)    Chloride 96 (*)    CO2 20 (*)    Glucose, Bld 116 (*)    Creatinine, Ser 1.47 (*)    Albumin 2.8 (*)    AST 76 (*)    Total Bilirubin 1.8 (*)    GFR calc non Af Amer 45 (*)    GFR calc Af Amer 52 (*)    All other components within normal limits  CBC - Abnormal; Notable for the following components:   RBC 3.86 (*)    Hemoglobin 11.6 (*)    HCT 35.0 (*)    RDW 22.4 (*)    Platelets 146 (*)    All other components within normal limits  SARS CORONAVIRUS 2  PROTIME-INR   AMMONIA    EKG EKG Interpretation  Date/Time:  Sunday December 23 2018 09:21:59 EDT Ventricular Rate:  70 PR Interval:    QRS Duration: 130 QT Interval:  470 QTC Calculation: 508 R Axis:   80 Text Interpretation:  Atrial fibrillation Nonspecific intraventricular conduction delay Nonspecific T abnormalities, anterior leads No significant change since last tracing Confirmed by Gareth Morgan ([id number]) on 12/23/2018 10:57:57 AM   Radiology Dg Chest Port 1 View  Result Date: 12/23/2018 CLINICAL DATA:  Shortness of breath for 1 week. EXAM: PORTABLE CHEST 1 VIEW COMPARISON:  August 14, 2017 FINDINGS: Mediastinal contour and cardiac silhouette are normal. Lung volumes are low. Minimal atelectasis of left lung base is identified. There is no focal pneumonia or pulmonary edema. The bony structures are stable. IMPRESSION: Minimal atelectasis of left lung base.  No focal pneumonia. Electronically Signed   By: Abelardo Diesel M.D.   On: 12/23/2018 10:49    Procedures Procedures (including critical care time)  Medications Ordered in ED Medications - No data to display   Initial Impression / Assessment and Plan / ED Course  I have reviewed the triage vital signs and the nursing notes.  Pertinent labs & imaging results that were available during my care of the patient were reviewed by me and considered in my medical decision making (see chart for details).  78 yo male with hx cirrhosis and ascites with recent paracentesis, presenting with SOB this AM, weight gain, and distended abdomen.  Suspect SOB 2/2 ascites and inability to take a deep breath in setting of recently diagnosed cirrhosis with hx ascites and recent paracentesis with weight gain in last 24 hrs and distended abdomen.  Mentating appropriately at baseline, no evidence of hepatic encephalopathy.  Will obtain CXR to rule out pleural effusions.  Order CMP, CBC to assess as well.  Patient's vitals  stable.  EKG reviewed with A fib, rate  controlled.  Abdominal US not indicated given known cirrhosis with known hx ascites.  Will add on ammonia given report of increased fatigue.  CMP reviewed with hyponatremia to 129, hyperkalemia to 5.2, AKI with Cr 1.47.  CXR without effusions or pneumonia.  Will consult hospitalist to admit for generalized weakness, shortness of breath likely requiring paracentesis, and AKI.  Spoke with Dr. Lorin Mercy of [facility] who agrees to accept patient for admission for further management and work up of shortness of breath and ascites.   Final Clinical Impressions(s) / ED Diagnoses   Final diagnoses:  Shortness of breath    ED Discharge Orders    None       Cleophas Dunker, DO 12/23/18 1228    Lamaria Hildebrandt, Bernita Raisin, DO 12/23/18 1336    Gareth Morgan, MD 12/24/18 1110

## 2018-12-23 NOTE — Procedures (Signed)
PROCEDURE SUMMARY:  Successful image-guided paracentesis from the right upper abdomen.  Yielded 6.1 liters of clear yellow fluid.  No immediate complications.  EBL: zero Patient tolerated well.   Specimen was sent for labs.  Please see imaging section of Epic for full dictation.  Joaquim Nam PA-C 12/23/2018 2:37 PM

## 2018-12-23 NOTE — ED Notes (Signed)
Condom cath placed on pt 

## 2018-12-23 NOTE — H&P (Signed)
History and Physical    [name] BCW:[id number] DOB: 11-02-1940 DOA: 12/23/2018  PCP: Merrilee Seashore, MD Consultants:  Einar Gip - cardiology; [facility] [place] Patient coming from:  Home - lives with wife; [name]: Wife, [phone]  Chief Complaint: SOB  HPI: [name] is a 78 y.o. male with medical history significant of hypothyroidism; RA; Legionella PNA; HTN; HLD; afib on Flecainide and Eliquis; and DM presenting with SOB.  He was recently diagnosed with cirrhosis with chronic edema; recent paracentesis on 7/20.  He has done very well since then.  Yesterday, it seemed that something happened or the pill didn't work.  He started bloating and it was difficult for him to breathe freely.  He was 180 prior to paracentesis, actual weight is 156, 173 post-para, and today it is 179.  +SOB, no cough, no fever.  He was not SOB prior to para.  He did have a routine appointment scheduled for tomorrow with GI.  No jaundice.  He is able to take PO as usual.  Normal BMs.  No nausea/vomiting.  No fever.   ED Course:  H/o recent diagnosis of cirrhosis.  Paracentesis with 4L taken off Monday.  Now with worsening edema, abdominal distention, AKI from 1.08 to 1.4.  Needs diuresis and possible paracentesis but difficult fluid balance.  Review of Systems: As per HPI; otherwise review of systems reviewed and negative.   Ambulatory Status:  Ambulates with a cane  Past Medical History:  Diagnosis Date   DM (diabetes mellitus) ([place])    GERD (gastroesophageal reflux disease)    Hyperlipidemia    Hypertension    Legionella pneumonia ([place])    Liver cirrhosis secondary to NASH ([place])    Osteoarthritis    Osteopenia    Rheumatoid arthritis ([place])    Thyroid disease     Past Surgical History:  Procedure Laterality Date   COLONOSCOPY     IR PARACENTESIS  12/10/2018   KNEE SURGERY Bilateral    to clear out arthritis    Social History   Socioeconomic History   Marital  status: Married    Spouse name: Not on file   Number of children: 2   Years of education: Not on file   Highest education level: Not on file  Occupational History   Occupation: retired Arts administrator from [employer]  [name] resource strain: Not on file   Food insecurity    Worry: Not on file    Inability: Not on file   Transportation needs    Medical: Not on file    Non-medical: Not on file  Tobacco Use   Smoking status: Former Smoker    Packs/day: 0.50    Years: 20.00    Pack years: 10.00    Types: Cigarettes    Quit date: 11/02/2014    Years since quitting: 4.1   Smokeless tobacco: Never Used  Substance and Sexual Activity   Alcohol use: Not Currently    Alcohol/week: 0.0 standard drinks    Comment: occasional, no h/o heavy use   Drug use: No   Sexual activity: Not on file  Lifestyle   Physical activity    Days per week: Not on file    Minutes per session: Not on file   Stress: Not on file  Relationships   Social connections    Talks on phone: Not on file    Gets together: Not on file    Attends religious service: Not on file  Active member of club or organization: Not on file    Attends meetings of clubs or organizations: Not on file    Relationship status: Not on file   Intimate partner violence    Fear of current or ex partner: Not on file    Emotionally abused: Not on file    Physically abused: Not on file    Forced sexual activity: Not on file  Other Topics Concern   Not on file  Social History Narrative   Married   Retired   Scientist, physiological- PhD    Allergies  Allergen Reactions   Hydrocodone Nausea Only    Family History  Problem Relation Age of Onset   Diabetes Mellitus I Father    Diabetes Mellitus I Mother    CAD Brother     Prior to Admission medications   Medication Sig Start Date End Date Taking? Authorizing Provider  acetaminophen (TYLENOL) 325 MG tablet Take 1-2 tablets (325-650 mg total) by mouth  every 4 (four) hours as needed for mild pain. 08/25/17  Yes Love, Ivan Anchors, PA-C  ELIQUIS 5 MG TABS tablet Take 1 tablet by mouth twice daily Patient taking differently: Take 5 mg by mouth 2 (two) times daily.  11/19/18  Yes Adrian Prows, MD  flecainide [facility]) 50 MG tablet Take 1 tablet by mouth twice daily 11/26/18  Yes Miquel Dunn, NP  folic acid (FOLVITE) 1 MG tablet Take 1 tablet (1 mg total) by mouth daily. 08/25/17  Yes Love, Ivan Anchors, PA-C  furosemide (LASIX) 40 MG tablet Take 40 mg by mouth.   Yes [provider]  levothyroxine (SYNTHROID) 100 MCG tablet Take 100 mcg by mouth daily before breakfast.    Yes [provider]  metFORMIN (GLUCOPHAGE) 500 MG tablet Take 500-1,000 mg by mouth See admin instructions. Take 1 tablet (541m) by mouth once daily in the morning, and 2 tablets (10061m in the evening   Yes [provider]  metoprolol tartrate (LOPRESSOR) 25 MG tablet Take 1 tablet by mouth twice daily Patient taking differently: Take 25 mg by mouth 2 (two) times daily.  12/21/18  Yes GaAdrian ProwsMD  Multiple Vitamin (MULTIVITAMIN) tablet Take 1 tablet by mouth daily.   Yes [provider]  rosuvastatin (CRESTOR) 10 MG tablet Take 1 tablet (10 mg total) by mouth daily. 08/25/17  Yes Love, PaIvan AnchorsPA-C  spironolactone (ALDACTONE) 25 MG tablet Take 25 mg by mouth daily. 12/13/18  Yes [provider]  vitamin B-12 (CYANOCOBALAMIN) 1000 MCG tablet Take 1,000 mcg by mouth daily.   Yes [provider]  cephALEXin (KEFLEX) 500 MG capsule Take 500 mg by mouth 3 (three) times daily.    [provider]  metFORMIN (GLUCOPHAGE-XR) 500 MG 24 hr tablet Take 1 tablet (500 mg total) by mouth daily with breakfast. Patient not taking: Reported on 12/23/2018 08/25/17   Love, PaIvan AnchorsPA-C  potassium chloride SA (K-DUR,KLOR-CON) 20 MEQ tablet Take 1 tablet (20 mEq total) by mouth daily. Patient not taking: Reported on 12/23/2018 08/26/17   [name]  Physical Exam: Vitals:   12/23/18 1445 12/23/18 1450 12/23/18 1454 12/23/18 1551  BP: 118/68 114/70 115/67 137/77  Pulse:    78  Resp:    16  Temp:    97.6 F (36.4 C)  TempSrc:    Axillary  SpO2:    100%  Weight:    74.1 kg  Height:    5' 6"  (1.676 m)  General:  Appears calm and comfortable and is NAD  Eyes:  PERRL, EOMI, normal lids, iris  ENT:  grossly normal hearing, lips & tongue, mmm; appropriate dentition  Neck:  no LAD, masses or thyromegaly  Cardiovascular:  RRR, no m/r/g. 2-3+ chronic LE edema that he says is not different from his norm  Respiratory:   CTA bilaterally with no wheezes/rales/rhonchi.  Normal respiratory effort.  Abdomen:  Marked distention, very protuberant.  Dressing in place from prior paracentesis in RLQ and the transparent covering appears to be shearing the skin due to the distention      Skin:  no rash or induration seen on limited exam  Musculoskeletal:  grossly normal tone BUE/BLE, good ROM, no bony abnormality  Psychiatric:  grossly normal mood and affect, speech fluent and appropriate, AOx3  Neurologic:  CN 2-12 grossly intact, moves all extremities in coordinated fashion, sensation intact    Radiological Exams on Admission: Dg Chest Port 1 View  Result Date: 12/23/2018 CLINICAL DATA:  Shortness of breath for 1 week. EXAM: PORTABLE CHEST 1 VIEW COMPARISON:  August 14, 2017 FINDINGS: Mediastinal contour and cardiac silhouette are normal. Lung volumes are low. Minimal atelectasis of left lung base is identified. There is no focal pneumonia or pulmonary edema. The bony structures are stable. IMPRESSION: Minimal atelectasis of left lung base.  No focal pneumonia. Electronically Signed   By: Abelardo Diesel M.D.   On: 12/23/2018 10:49    EKG: Independently reviewed.  Afib with rate 70; nonspecific ST changes with no evidence of acute ischemia; NSCSLT   Labs on Admission: I have personally reviewed the available  labs and imaging studies at the time of the admission.  Pertinent labs:   Na++ 129 K+ 5.2 CO2 20 Glucose 116 BUN 11/Creatinine 1.47/GFR 45; <5/0.79/>60 in 4/19 Albumin 2.8 AST 76/ALT 25/Bili 1.8 WBC 6.7 Hgb 11.6 Plt 146 Paracentesis 7/20: 165 WBC, 35% monocytes; <1 albumin; <3 protein; no WBC   Assessment/Plan Principal Problem:   Ascites Active Problems:   Rheumatoid arthritis (HCC)   Type 2 diabetes mellitus with other specified complication (HCC)   PAF (paroxysmal atrial fibrillation) (HCC)   Hypothyroidism   Renal dysfunction   Cirrhosis with recurrent ascites -Patient with recent diagnosis of cirrhosis of uncertain etiology - NASH vs. Alcoholic -Followed by [facility] and records are not available at this time -He had his initial paracentesis on 7/20 with fluid analysis c/w cirrhosis without SBP -He was doing well post-procedure but had rapid reaccummulation of fluid yesterday -Patient presented today with SOB and abdominal distention -He clearly appeared to need repeat paracentesis and so this was ordered via IR; studies are pending -He was ordered to be given 50 grams of albumin with the procedure, as per GI -I spoke with Dr. Alessandra Bevels and someone from [facility] will consult tomorrow -Plan will be to increase spironolactone to 50-100 mg depending on renal function tomorrow -He may need imaging - cancer, thrombosis of portal vein - ideally CT with contrast, does not need to be done until renal function improves -Ammonia normal and patient does not appear to be encephalopathic at this time -MELD/MELD-Na score is 22/27, with a mortality rate of 19.6%  Renal dysfunction -His baseline creatinine is not entirely clear since there are no labs available in Epic since 4/19 -He may have a component of AKI for now -As such, will not increase diuretics at this time -Recheck BMP in AM -Hold CT with contrast until renal situation is more clear  RA -He does not appear to be  taking medication for this issue at this time.  DM -hold Glucophage -Cover with moderate-scale SSI  Afib -Continue Flecainide and Lopressor for rate control -He was briefly transitioned to heparin since it was unclear when the paracentesis would take place, but since this is complete will resume Eliquis  Hypothyroidism -Continue Synthroid at current dose for now    Note: This patient has been tested and is negative for the novel coronavirus COVID-19.  DVT prophylaxis: Heparin -> Eliquis Code Status:  Full - confirmed with patient Family Communication: None present Disposition Plan:  Home once clinically improved Consults called: GI  Admission status: Admit - It is my clinical opinion that admission to INPATIENT is reasonable and necessary because of the expectation that this patient will require hospital care that crosses at least 2 midnights to treat this condition based on the medical complexity of the problems presented.  Given the aforementioned information, the predictability of an adverse outcome is felt to be significant.     Karmen Bongo MD [facility]   How to contact the [facility] Attending or Consulting provider [place] or covering provider during after hours [place], for this patient?  1. Check the care team in [facility] and look for a) attending/consulting [facility] provider listed and b) the [facility] team listed 2. Log into [email or web address] and use 's universal password to access. If you do not have the password, please contact the hospital operator. 3. Locate the [facility] provider you are looking for under [facility] and page to a number that you can be directly reached. 4. If you still have difficulty reaching the provider, please page the [facility] (Director on Call) for the Hospitalists listed on amion for assistance.   12/23/2018, 4:58 PM

## 2018-12-24 ENCOUNTER — Encounter (HOSPITAL_COMMUNITY): Payer: Self-pay | Admitting: Internal Medicine

## 2018-12-24 DIAGNOSIS — K7460 Unspecified cirrhosis of liver: Secondary | ICD-10-CM | POA: Diagnosis present

## 2018-12-24 DIAGNOSIS — E1169 Type 2 diabetes mellitus with other specified complication: Secondary | ICD-10-CM | POA: Diagnosis present

## 2018-12-24 DIAGNOSIS — E876 Hypokalemia: Secondary | ICD-10-CM | POA: Diagnosis present

## 2018-12-24 DIAGNOSIS — Z87891 Personal history of nicotine dependence: Secondary | ICD-10-CM | POA: Diagnosis not present

## 2018-12-24 DIAGNOSIS — K8020 Calculus of gallbladder without cholecystitis without obstruction: Secondary | ICD-10-CM | POA: Diagnosis present

## 2018-12-24 DIAGNOSIS — E1142 Type 2 diabetes mellitus with diabetic polyneuropathy: Secondary | ICD-10-CM | POA: Diagnosis present

## 2018-12-24 DIAGNOSIS — I4891 Unspecified atrial fibrillation: Secondary | ICD-10-CM

## 2018-12-24 DIAGNOSIS — E871 Hypo-osmolality and hyponatremia: Secondary | ICD-10-CM | POA: Diagnosis present

## 2018-12-24 DIAGNOSIS — Z8249 Family history of ischemic heart disease and other diseases of the circulatory system: Secondary | ICD-10-CM | POA: Diagnosis not present

## 2018-12-24 DIAGNOSIS — Z79899 Other long term (current) drug therapy: Secondary | ICD-10-CM | POA: Diagnosis not present

## 2018-12-24 DIAGNOSIS — Z7901 Long term (current) use of anticoagulants: Secondary | ICD-10-CM | POA: Diagnosis not present

## 2018-12-24 DIAGNOSIS — R0602 Shortness of breath: Secondary | ICD-10-CM | POA: Diagnosis present

## 2018-12-24 DIAGNOSIS — I480 Paroxysmal atrial fibrillation: Secondary | ICD-10-CM | POA: Diagnosis present

## 2018-12-24 DIAGNOSIS — E875 Hyperkalemia: Secondary | ICD-10-CM | POA: Diagnosis present

## 2018-12-24 DIAGNOSIS — I10 Essential (primary) hypertension: Secondary | ICD-10-CM | POA: Diagnosis present

## 2018-12-24 DIAGNOSIS — K7581 Nonalcoholic steatohepatitis (NASH): Secondary | ICD-10-CM | POA: Diagnosis present

## 2018-12-24 DIAGNOSIS — N179 Acute kidney failure, unspecified: Secondary | ICD-10-CM | POA: Diagnosis present

## 2018-12-24 DIAGNOSIS — K219 Gastro-esophageal reflux disease without esophagitis: Secondary | ICD-10-CM | POA: Diagnosis present

## 2018-12-24 DIAGNOSIS — R188 Other ascites: Secondary | ICD-10-CM | POA: Diagnosis present

## 2018-12-24 DIAGNOSIS — E039 Hypothyroidism, unspecified: Secondary | ICD-10-CM | POA: Diagnosis present

## 2018-12-24 DIAGNOSIS — E785 Hyperlipidemia, unspecified: Secondary | ICD-10-CM | POA: Diagnosis present

## 2018-12-24 DIAGNOSIS — Z833 Family history of diabetes mellitus: Secondary | ICD-10-CM | POA: Diagnosis not present

## 2018-12-24 DIAGNOSIS — Z96653 Presence of artificial knee joint, bilateral: Secondary | ICD-10-CM | POA: Diagnosis present

## 2018-12-24 DIAGNOSIS — Z20828 Contact with and (suspected) exposure to other viral communicable diseases: Secondary | ICD-10-CM | POA: Diagnosis present

## 2018-12-24 DIAGNOSIS — M069 Rheumatoid arthritis, unspecified: Secondary | ICD-10-CM | POA: Diagnosis present

## 2018-12-24 LAB — GLUCOSE, CAPILLARY
Glucose-Capillary: 102 mg/dL — ABNORMAL HIGH (ref 70–99)
Glucose-Capillary: 138 mg/dL — ABNORMAL HIGH (ref 70–99)
Glucose-Capillary: 137 mg/dL — ABNORMAL HIGH (ref 70–99)
Glucose-Capillary: 143 mg/dL — ABNORMAL HIGH (ref 70–99)

## 2018-12-24 LAB — CBC
HCT: 28.8 % — ABNORMAL LOW (ref 39.0–52.0)
Hemoglobin: 10 g/dL — ABNORMAL LOW (ref 13.0–17.0)
MCH: 30.2 pg (ref 26.0–34.0)
MCHC: 34.7 g/dL (ref 30.0–36.0)
MCV: 87 fL (ref 80.0–100.0)
Platelets: 118 10*3/uL — ABNORMAL LOW (ref 150–400)
RBC: 3.31 MIL/uL — ABNORMAL LOW (ref 4.22–5.81)
RDW: 21.5 % — ABNORMAL HIGH (ref 11.5–15.5)
WBC: 6.3 10*3/uL (ref 4.0–10.5)
nRBC: 0 % (ref 0.0–0.2)

## 2018-12-24 LAB — COMPREHENSIVE METABOLIC PANEL
ALT: 17 U/L (ref 0–44)
AST: 36 U/L (ref 15–41)
Albumin: 2.9 g/dL — ABNORMAL LOW (ref 3.5–5.0)
Alkaline Phosphatase: 54 U/L (ref 38–126)
Anion gap: 9 (ref 5–15)
BUN: 8 mg/dL (ref 8–23)
CO2: 23 mmol/L (ref 22–32)
Calcium: 9.1 mg/dL (ref 8.9–10.3)
Chloride: 100 mmol/L (ref 98–111)
Creatinine, Ser: 1.21 mg/dL (ref 0.61–1.24)
GFR calc Af Amer: >60 mL/min (ref >60)
GFR calc non Af Amer: 57 mL/min — ABNORMAL LOW (ref >60)
Glucose, Bld: 119 mg/dL — ABNORMAL HIGH (ref 70–99)
Potassium: 2.7 mmol/L — CL (ref 3.5–5.1)
Sodium: 132 mmol/L — ABNORMAL LOW (ref 135–145)
Total Bilirubin: 1.2 mg/dL (ref 0.3–1.2)
Total Protein: 5.5 g/dL — ABNORMAL LOW (ref 6.5–8.1)

## 2018-12-24 LAB — POTASSIUM: Potassium: 3.1 mmol/L — ABNORMAL LOW (ref 3.5–5.1)

## 2018-12-24 LAB — MAGNESIUM: Magnesium: 1.2 mg/dL — ABNORMAL LOW (ref 1.7–2.4)

## 2018-12-24 MED ORDER — POTASSIUM CHLORIDE CRYS ER 20 MEQ PO TBCR
40.0000 meq | EXTENDED_RELEASE_TABLET | Freq: Two times a day (BID) | ORAL | Status: AC
  Administered 2018-12-24: 40 meq via ORAL
  Filled 2018-12-24 (×2): qty 2

## 2018-12-24 MED ORDER — SPIRONOLACTONE 25 MG PO TABS
50.0000 mg | ORAL_TABLET | Freq: Every day | ORAL | Status: DC
  Administered 2018-12-25: 50 mg via ORAL
  Filled 2018-12-24: qty 2

## 2018-12-24 MED ORDER — METOPROLOL TARTRATE 12.5 MG HALF TABLET
12.5000 mg | ORAL_TABLET | Freq: Two times a day (BID) | ORAL | Status: DC
  Filled 2018-12-24: qty 1

## 2018-12-24 MED ORDER — MAGNESIUM SULFATE 2 GM/50ML IV SOLN
2.0000 g | Freq: Once | INTRAVENOUS | Status: AC
  Administered 2018-12-24: 2 g via INTRAVENOUS
  Filled 2018-12-24: qty 50

## 2018-12-24 MED ORDER — TRAZODONE HCL 50 MG PO TABS: 25.0000 mg | ORAL_TABLET | Freq: Every evening | ORAL | Status: DC | PRN

## 2018-12-24 MED ORDER — SPIRONOLACTONE 25 MG PO TABS
25.0000 mg | ORAL_TABLET | Freq: Once | ORAL | Status: AC
  Administered 2018-12-24: 25 mg via ORAL
  Filled 2018-12-24: qty 1

## 2018-12-24 NOTE — Progress Notes (Signed)
CRITICAL VALUE ALERT  Critical Value:  Potassium 2.7  Date & Time Notied:  08/03, 0350  Provider Notified: X. Blount  Orders Received/Actions taken: New orders received

## 2018-12-24 NOTE — Progress Notes (Signed)
[facility] notified that patient has BP 96/57 HR 69 and Metoprolol 12.5 scheduled to give. Arthor Captain LPN

## 2018-12-24 NOTE — Progress Notes (Signed)
[facility] PROGRESS NOTE  [name] WFU:[id number] DOB: 10/01/1940 DOA: 12/23/2018 PCP: Merrilee Seashore, MD  Assessment/Plan:  Cirrhosis with recurrent ascites. Recent diagnosis of cirrhosis of uncertain etiology - NASH vs. Alcoholic. S/p paracentesis 12/23/18 with removal of 6L fluid.  Evaluated by Dr. Penelope Coop GI who recommends 2gm sodium diet as well as spironolactone and lasix. Patient reports feeling as though fluid building again.  -Hep panel -continue lasix and spironolactone. Defer increasing dosing to GI -Consider CT with contrast,when kidney function improves -MELD/MELD-Na score is 22/27, with a mortality rate of 19.6%  Renal dysfunction.  Presumably AKI but baseline not clear. Creatinine trending downward.  -consider increasing diuretics -consider CT? Will defer to GI -monitor closely  RA -He does not appear to be taking medication for this issue at this time.  DM HgA1c 6.2 last year. Home meds include glucophage.  -hold oral agents -Cover with moderate-scale SSI  Afib rate controlled. -Continue Flecainide and Lopressor for rate control -continue eliquis  Hypothyroidism -Continue Synthroid at current dose for now  Hypokalemia. Potassium 2.7. likely as result of yesterdays paracentesis removing 6L fluids.  -repleted -recheck in am  Hypomagnesemia. See above. Mag level 1.2 -repleted    Code Status: full Family Communication: patient Disposition Plan: home when ready   Consultants:  Dr. Penelope Coop GI  Procedures:  Paracentesis 8/3  Antibiotics:    HPI/Subjective: Awake alert complains of room being cold. Reports feeling like abdominal fluid building again  Objective: Vitals:   12/23/18 2030 12/24/18 0451  BP: (!) 106/58 116/64  Pulse: (!) 101 88  Resp: 17 17  Temp: 97.9 F (36.6 C) 98.1 F (36.7 C)  SpO2: 100% 99%    Intake/Output Summary (Last 24 hours) at 12/24/2018 1046 Last data filed at 12/24/2018 0900 Gross per 24  hour  Intake 660 ml  Output 780 ml  Net -120 ml   Filed Weights   12/23/18 1300 12/23/18 1551  Weight: 74.5 kg 74.1 kg    Exam:   General:  Awake alert oriented x3  Cardiovascular: irregularly irregular no mgr no LE edema  Respiratory: normal effort BS clear bilaterally no wheeze  Abdomen: distended soft +wave +BS no guarding or rebounding  Musculoskeletal: joint without swelling/erthema   Data Reviewed: Basic Metabolic Panel: Recent Labs  Lab 12/23/18 1010 12/24/18 0238  NA 129* 132*  K 5.2* 2.7*  CL 96* 100  CO2 20* 23  GLUCOSE 116* 119*  BUN 11 8  CREATININE 1.47* 1.21  CALCIUM 9.5 9.1  MG  --  1.2*   Liver Function Tests: Recent Labs  Lab 12/23/18 1010 12/24/18 0238  AST 76* 36  ALT 25 17  ALKPHOS 75 54  BILITOT 1.8* 1.2  PROT 6.5 5.5*  ALBUMIN 2.8* 2.9*   No results for input(s): LIPASE, AMYLASE in the last 168 hours. Recent Labs  Lab 12/23/18 1546  AMMONIA 29   CBC: Recent Labs  Lab 12/23/18 1010 12/24/18 0238  WBC 6.7 6.3  HGB 11.6* 10.0*  HCT 35.0* 28.8*  MCV 90.7 87.0  PLT 146* 118*   Cardiac Enzymes: No results for input(s): CKTOTAL, CKMB, CKMBINDEX, TROPONINI in the last 168 hours. BNP (last 3 results) No results for input(s): BNP in the last 8760 hours.  ProBNP (last 3 results) No results for input(s): PROBNP in the last 8760 hours.  CBG: Recent Labs  Lab 12/23/18 1642 12/24/18 0807  GLUCAP 105* 102*    Recent Results (from the past 240 hour(s))  SARS Coronavirus 2 [facility]  order, Performed in [place] hospital lab)     Status: None   Collection Time: 12/23/18  1:44 PM  Result Value Ref Range Status   SARS Coronavirus 2 NEGATIVE NEGATIVE Final    Comment: (NOTE) If result is NEGATIVE SARS-CoV-2 target nucleic acids are NOT DETECTED. The SARS-CoV-2 RNA is generally detectable in upper and lower  respiratory specimens during the acute phase of infection. The lowest  concentration of SARS-CoV-2 viral copies  this assay can detect is 250  copies / mL. A negative result does not preclude SARS-CoV-2 infection  and should not be used as the sole basis for treatment or other  patient management decisions.  A negative result may occur with  improper specimen collection / handling, submission of specimen other  than nasopharyngeal swab, presence of viral mutation(s) within the  areas targeted by this assay, and inadequate number of viral copies  (<250 copies / mL). A negative result must be combined with clinical  observations, patient history, and epidemiological information. If result is POSITIVE SARS-CoV-2 target nucleic acids are DETECTED. The SARS-CoV-2 RNA is generally detectable in upper and lower  respiratory specimens dur ing the acute phase of infection.  Positive  results are indicative of active infection with SARS-CoV-2.  Clinical  correlation with patient history and other diagnostic information is  necessary to determine patient infection status.  Positive results do  not rule out bacterial infection or co-infection with other viruses. If result is PRESUMPTIVE POSTIVE SARS-CoV-2 nucleic acids MAY BE PRESENT.   A presumptive positive result was obtained on the submitted specimen  and confirmed on repeat testing.  While 2019 novel coronavirus  (SARS-CoV-2) nucleic acids may be present in the submitted sample  additional confirmatory testing may be necessary for epidemiological  and / or clinical management purposes  to differentiate between  SARS-CoV-2 and other Sarbecovirus currently known to infect humans.  If clinically indicated additional testing with an alternate test  methodology [phone]) is advised. The SARS-CoV-2 RNA is generally  detectable in upper and lower respiratory sp ecimens during the acute  phase of infection. The expected result is Negative. Fact Sheet for Patients:  StrictlyIdeas.no Fact Sheet for Healthcare  Providers: BankingDealers.co.za This test is not yet approved or cleared by the [place] FDA and has been authorized for detection and/or diagnosis of SARS-CoV-2 by FDA under an Emergency Use Authorization (EUA).  This EUA will remain in effect (meaning this test can be used) for the duration of the COVID-19 declaration under Section 564(b)(1) of the Act, 21 U.S.C. section 360bbb-3(b)(1), unless the authorization is terminated or revoked sooner. Performed at [facility], [place] [address], [place], [place] [address]   Culture, body fluid-bottle     Status: None (Preliminary result)   Collection Time: 12/23/18  2:44 PM   Specimen: Peritoneal Washings  Result Value Ref Range Status   Specimen Description PERITONEAL  Final   Special Requests NONE  Final   Culture   Final    NO GROWTH < 24 HOURS Performed at [facility], [place] [address], [place], [place] [address]    Report Status PENDING  Incomplete  Gram stain     Status: None   Collection Time: 12/23/18  2:44 PM   Specimen: Peritoneal Washings  Result Value Ref Range Status   Specimen Description PERITONEAL  Final   Special Requests NONE  Final   Gram Stain   Final    RARE WBC PRESENT, PREDOMINANTLY MONONUCLEAR NO ORGANISMS  SEEN Performed at [facility], [place] [address], [place], [place] [address]    Report Status 12/23/2018 FINAL  Final     Studies: US Paracentesis  Result Date: 12/23/2018 INDICATION: Patient with recently diagnosed cirrhosis, followed by [facility]. Previous paracentesis 12/10/2018 in IR yielding 4 L peritoneal fluid which was requested maximum. Patient presented to ER today with complaints of shortness of breath, bilateral lower extremity edema and abdominal distension. Request IR for diagnostic and therapeutic paracentesis with 8 L maximum. EXAM: ULTRASOUND GUIDED diagnostic and therapeutic PARACENTESIS MEDICATIONS: 15 mL 1% lidocaine COMPLICATIONS: None immediate.  PROCEDURE: Informed written consent was obtained from the patient after a discussion of the risks, benefits and alternatives to treatment. A timeout was performed prior to the initiation of the procedure. Initial ultrasound scanning demonstrates a large amount of ascites within the right upper abdominal quadrant. The right upper abdomen was prepped and draped in the usual sterile fashion. Of note previous right lower quadrant paracentesis site with Tegaderm and gauze still present which was removed prior to procedure today, several areas of scabbing surrounding previous paracentesis site. Previous paracentesis site intact no leakage, no bleeding. 1% lidocaine was used for local anesthesia. Following this, a 19 gauge, 7-cm, Yueh catheter was introduced. An ultrasound image was saved for documentation purposes. The paracentesis was performed. The catheter was removed and a dressing was applied. The patient tolerated the procedure well without immediate post procedural complication. FINDINGS: A total of approximately 6.1 L of clear yellow fluid was removed. Samples were sent to the laboratory as requested by the clinical team. IMPRESSION: Successful ultrasound-guided paracentesis yielding 6.1 liters of peritoneal fluid. Read by Candiss Norse, PA-C Electronically Signed   By: Lucrezia Europe M.D.   On: 12/23/2018 15:13   Dg Chest Port 1 View  Result Date: 12/23/2018 CLINICAL DATA:  Shortness of breath for 1 week. EXAM: PORTABLE CHEST 1 VIEW COMPARISON:  August 14, 2017 FINDINGS: Mediastinal contour and cardiac silhouette are normal. Lung volumes are low. Minimal atelectasis of left lung base is identified. There is no focal pneumonia or pulmonary edema. The bony structures are stable. IMPRESSION: Minimal atelectasis of left lung base.  No focal pneumonia. Electronically Signed   By: Abelardo Diesel M.D.   On: 12/23/2018 10:49    Scheduled Meds: . apixaban  5 mg Oral BID  . docusate sodium  100 mg Oral BID  .  flecainide  50 mg Oral BID  . folic acid  1 mg Oral Daily  . furosemide  40 mg Oral Daily  . insulin aspart  0-15 Units Subcutaneous TID WC  . insulin aspart  0-5 Units Subcutaneous QHS  . levothyroxine  100 mcg Oral QAC breakfast  . metoprolol tartrate  25 mg Oral BID  . multivitamin with minerals  1 tablet Oral Daily  . potassium chloride  40 mEq Oral BID WC  . rosuvastatin  10 mg Oral Daily  . spironolactone  25 mg Oral Daily  . vitamin B-12  1,000 mcg Oral Daily   Continuous Infusions: . magnesium sulfate bolus IVPB 2 g (12/24/18 1014)    Principal Problem:   Ascites Active Problems:   Type 2 diabetes mellitus with other specified complication (HCC)   Renal dysfunction   Liver cirrhosis secondary to NASH (HCC)   PAF (paroxysmal atrial fibrillation) (HCC)   Rheumatoid arthritis ([place])   Hypothyroidism    Time spent: 61 minutes    [place] NP  [facility]  If 7PM-7AM, please  contact night-coverage at [email or web address], password [facility] 12/24/2018, 10:46 AM  LOS: 0 days

## 2018-12-24 NOTE — Discharge Instructions (Signed)

## 2018-12-24 NOTE — Consult Note (Signed)
Subjective:   HPI  The patient is a 78 year old male who was admitted to the hospital because of recurring ascites.  He was recently diagnosed with cirrhosis of the liver and had a paracentesis done on July 20.  He was on diuretic therapy but had recurrence of ascites and was admitted to the hospital yesterday and had another paracentesis with removal of 6.1 L of clear yellow fluid.  He states he used to drink wine but was not a heavy drinker.  He denies abdominal pain or vomiting.  He is eating.  He had an appointment to see Dr. Michail Sermon from [facility] today, but he is actually never seen him.  He denies ever having had hepatitis.  I asked him if he had ever been checked for that and he stated that he had but that it was negative.  At this point the etiology of his cirrhosis is unclear.  Review of Systems No chest pain or shortness of breath  Past Medical History:  Diagnosis Date  . DM (diabetes mellitus) ([place])   . GERD (gastroesophageal reflux disease)   . Hyperlipidemia   . Hypertension   . 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 Liver cirrhosis secondary to NASH ([place])   . Osteoarthritis   . Osteopenia   . Rheumatoid arthritis ([place])   . Thyroid disease    Past Surgical History:  Procedure Laterality Date  . COLONOSCOPY    . IR PARACENTESIS  12/10/2018  . KNEE SURGERY Bilateral    to clear out arthritis   Social History   Socioeconomic History  . Marital status: Married    Spouse name: Not on file  . Number of children: 2  . Years of education: Not on file  . Highest education level: Not on file  Occupational History  . Occupation: retired Arts administrator from [employer]  Social Needs  . Financial resource strain: Not on file  . Food insecurity    Worry: Not on file    Inability: Not on file  . Transportation needs    Medical: Not on file    Non-medical: Not on file  Tobacco Use  . Smoking status: Former Smoker    Packs/day: 0.50    Years: 20.00    Pack years: 10.00    Types:  Cigarettes    Quit date: 11/02/2014    Years since quitting: 4.1  . Smokeless tobacco: Never Used  Substance and Sexual Activity  . Alcohol use: Not Currently    Alcohol/week: 0.0 standard drinks    Comment: occasional, no h/o heavy use  . Drug use: No  . Sexual activity: Not on file  Lifestyle  . Physical activity    Days per week: Not on file    Minutes per session: Not on file  . Stress: Not on file  Relationships  . Social Herbalist on phone: Not on file    Gets together: Not on file    Attends religious service: Not on file    Active member of club or organization: Not on file    Attends meetings of clubs or organizations: Not on file    Relationship status: Not on file  . Intimate partner violence    Fear of current or ex partner: Not on file    Emotionally abused: Not on file    Physically abused: Not on file    Forced sexual activity: Not on file  Other Topics Concern  . Not on file  Social History  Narrative   Married   Retired   Scientist, physiological- PhD   family history includes CAD in his brother; Diabetes Mellitus I in his father and mother.  Current Facility-Administered Medications:  .  apixaban (ELIQUIS) tablet 5 mg, 5 mg, Oral, BID, Karmen Bongo, MD, 5 mg at 12/24/18 0920 .  docusate sodium (COLACE) capsule 100 mg, 100 mg, Oral, BID, Karmen Bongo, MD, 100 mg at 12/24/18 0920 .  flecainide (TAMBOCOR) tablet 50 mg, 50 mg, Oral, BID, Karmen Bongo, MD, 50 mg at 32/12/24 8250 .  folic acid (FOLVITE) tablet 1 mg, 1 mg, Oral, Daily, Karmen Bongo, MD, 1 mg at 12/24/18 0920 .  furosemide (LASIX) tablet 40 mg, 40 mg, Oral, Daily, Karmen Bongo, MD, 40 mg at 12/24/18 0920 .  insulin aspart (novoLOG) injection 0-15 Units, 0-15 Units, Subcutaneous, TID WC, Karmen Bongo, MD .  insulin aspart (novoLOG) injection 0-5 Units, 0-5 Units, Subcutaneous, QHS, Karmen Bongo, MD .  levothyroxine (SYNTHROID) tablet 100 mcg, 100 mcg, Oral, QAC breakfast, Karmen Bongo, MD, 100 mcg at 12/24/18 0604 .  magnesium sulfate IVPB 2 g 50 mL, 2 g, Intravenous, Once, Black, Karen M, NP .  metoprolol tartrate (LOPRESSOR) tablet 25 mg, 25 mg, Oral, BID, Karmen Bongo, MD, 25 mg at 12/24/18 0920 .  multivitamin with minerals tablet 1 tablet, 1 tablet, Oral, Daily, Karmen Bongo, MD, 1 tablet at 12/24/18 0920 .  ondansetron (ZOFRAN) tablet 4 mg, 4 mg, Oral, Q6H PRN **OR** ondansetron (ZOFRAN) injection 4 mg, 4 mg, Intravenous, Q6H PRN, Karmen Bongo, MD .  potassium chloride SA (K-DUR) CR tablet 40 mEq, 40 mEq, Oral, BID WC, Blount, Xenia T, NP .  rosuvastatin (CRESTOR) tablet 10 mg, 10 mg, Oral, Daily, Karmen Bongo, MD, 10 mg at 12/24/18 0920 .  spironolactone (ALDACTONE) tablet 25 mg, 25 mg, Oral, Daily, Karmen Bongo, MD, 25 mg at 12/24/18 0920 .  vitamin B-12 (CYANOCOBALAMIN) tablet 1,000 mcg, 1,000 mcg, Oral, Daily, Karmen Bongo, MD, 1,000 mcg at 12/24/18 0920 Allergies  Allergen Reactions  . Hydrocodone Nausea Only     Objective:     BP 116/64 (BP Location: Right Arm)   Pulse 88   Temp 98.1 F (36.7 C) (Oral)   Resp 17   Ht 5' 6"  (1.676 m)   Wt 74.1 kg   SpO2 99%   BMI 26.37 kg/m   He is in no acute distress  Nonicteric  Heart regular rhythm no murmurs  Lungs clear  Abdomen: Bowel sounds present, soft, somewhat distended, nontender, ascites present  Laboratory No components found for: D1    Assessment:     Cirrhosis of the liver etiology unclear  Ascites      Plan:     He will need to be on a 2 g sodium diet.  His diuretics should be in the form of Lasix and Aldactone.  We will have to determine the appropriate dose going forward.  Electrolytes will need to be monitored as will renal function.

## 2018-12-25 ENCOUNTER — Other Ambulatory Visit: Payer: Self-pay

## 2018-12-25 DIAGNOSIS — R9431 Abnormal electrocardiogram [ECG] [EKG]: Secondary | ICD-10-CM | POA: Diagnosis not present

## 2018-12-25 LAB — CBC
HCT: 31 % — ABNORMAL LOW (ref 39.0–52.0)
Hemoglobin: 10.6 g/dL — ABNORMAL LOW (ref 13.0–17.0)
MCH: 30.2 pg (ref 26.0–34.0)
MCHC: 34.2 g/dL (ref 30.0–36.0)
MCV: 88.3 fL (ref 80.0–100.0)
Platelets: 114 10*3/uL — ABNORMAL LOW (ref 150–400)
RBC: 3.51 MIL/uL — ABNORMAL LOW (ref 4.22–5.81)
RDW: 21.7 % — ABNORMAL HIGH (ref 11.5–15.5)
WBC: 7.2 10*3/uL (ref 4.0–10.5)
nRBC: 0 % (ref 0.0–0.2)

## 2018-12-25 LAB — COMPREHENSIVE METABOLIC PANEL
ALT: 21 U/L (ref 0–44)
AST: 46 U/L — ABNORMAL HIGH (ref 15–41)
Albumin: 2.8 g/dL — ABNORMAL LOW (ref 3.5–5.0)
Alkaline Phosphatase: 57 U/L (ref 38–126)
Anion gap: 11 (ref 5–15)
BUN: 7 mg/dL — ABNORMAL LOW (ref 8–23)
CO2: 25 mmol/L (ref 22–32)
Calcium: 9.2 mg/dL (ref 8.9–10.3)
Chloride: 99 mmol/L (ref 98–111)
Creatinine, Ser: 1.17 mg/dL (ref 0.61–1.24)
GFR calc Af Amer: >60 mL/min (ref >60)
GFR calc non Af Amer: 59 mL/min — ABNORMAL LOW (ref >60)
Glucose, Bld: 111 mg/dL — ABNORMAL HIGH (ref 70–99)
Potassium: 2.9 mmol/L — ABNORMAL LOW (ref 3.5–5.1)
Sodium: 135 mmol/L (ref 135–145)
Total Bilirubin: 1.4 mg/dL — ABNORMAL HIGH (ref 0.3–1.2)
Total Protein: 5.5 g/dL — ABNORMAL LOW (ref 6.5–8.1)

## 2018-12-25 LAB — HEPATITIS PANEL, ACUTE
HCV Ab: <0.1 s/co ratio (ref 0.0–0.9)
Hep A IgM: NEGATIVE
Hep B C IgM: NEGATIVE
Hepatitis B Surface Ag: NEGATIVE

## 2018-12-25 LAB — GLUCOSE, CAPILLARY
Glucose-Capillary: 164 mg/dL — ABNORMAL HIGH (ref 70–99)
Glucose-Capillary: 164 mg/dL — ABNORMAL HIGH (ref 70–99)
Glucose-Capillary: 186 mg/dL — ABNORMAL HIGH (ref 70–99)
Glucose-Capillary: 128 mg/dL — ABNORMAL HIGH (ref 70–99)

## 2018-12-25 LAB — MAGNESIUM
Magnesium: 1.6 mg/dL — ABNORMAL LOW (ref 1.7–2.4)
Magnesium: 1.8 mg/dL (ref 1.7–2.4)

## 2018-12-25 LAB — POTASSIUM: Potassium: 4.4 mmol/L (ref 3.5–5.1)

## 2018-12-25 MED ORDER — SPIRONOLACTONE 25 MG PO TABS
50.0000 mg | ORAL_TABLET | Freq: Two times a day (BID) | ORAL | Status: DC
  Administered 2018-12-25 – 2018-12-26 (×2): 50 mg via ORAL
  Filled 2018-12-25 (×2): qty 2

## 2018-12-25 MED ORDER — MAGNESIUM OXIDE 400 (241.3 MG) MG PO TABS: 400.0000 mg | ORAL_TABLET | Freq: Every day | ORAL | Status: DC

## 2018-12-25 MED ORDER — POTASSIUM CHLORIDE CRYS ER 20 MEQ PO TBCR
40.0000 meq | EXTENDED_RELEASE_TABLET | Freq: Once | ORAL | Status: AC
  Administered 2018-12-25: 12:00:00 40 meq via ORAL
  Filled 2018-12-25: qty 2

## 2018-12-25 MED ORDER — MAGNESIUM OXIDE 400 (241.3 MG) MG PO TABS
400.0000 mg | ORAL_TABLET | Freq: Two times a day (BID) | ORAL | Status: DC
  Administered 2018-12-25 – 2018-12-26 (×2): 400 mg via ORAL
  Filled 2018-12-25 (×2): qty 1

## 2018-12-25 MED ORDER — POTASSIUM CHLORIDE CRYS ER 20 MEQ PO TBCR
40.0000 meq | EXTENDED_RELEASE_TABLET | Freq: Two times a day (BID) | ORAL | Status: DC
  Administered 2018-12-25 (×2): 40 meq via ORAL
  Filled 2018-12-25 (×2): qty 2

## 2018-12-25 MED ORDER — MAGNESIUM SULFATE 2 GM/50ML IV SOLN
2.0000 g | Freq: Once | INTRAVENOUS | Status: AC
  Administered 2018-12-25: 2 g via INTRAVENOUS
  Filled 2018-12-25: qty 50

## 2018-12-25 NOTE — Progress Notes (Addendum)
[facility] Progress Note  Subjective: The patient has no complaints today. Wonders if he could go home. No abdominal pain. Thinks his abdomen might be increasing in girth just a little, but not much.  Objective: Vital signs in last 24 hours: Temp:  [98.1 F (36.7 C)-98.4 F (36.9 C)] 98.1 F (36.7 C) (08/04 0533) Pulse Rate:  [65-72] 72 (08/04 0533) Resp:  [15-20] 15 (08/04 0533) BP: (93-106)/(57-67) 106/64 (08/04 0533) SpO2:  [98 %-100 %] 100 % (08/04 0533) Weight change:    PE:  No distress  Abdomen nontender, some ascites present  Lab Results: Results for orders placed or performed during the hospital encounter of 12/23/18 (from the past 24 hour(s))  Hepatitis panel, acute     Status: None   Collection Time: 12/24/18 11:31 AM  Result Value Ref Range   Hepatitis B Surface Ag Negative Negative   HCV Ab <0.1 0.0 - 0.9 s/co ratio   Hep A IgM Negative Negative   Hep B C IgM Negative Negative  Glucose, capillary     Status: Abnormal   Collection Time: 12/24/18 12:19 PM  Result Value Ref Range   Glucose-Capillary 138 (H) 70 - 99 mg/dL  Potassium     Status: Abnormal   Collection Time: 12/24/18  3:18 PM  Result Value Ref Range   Potassium 3.1 (L) 3.5 - 5.1 mmol/L  Glucose, capillary     Status: Abnormal   Collection Time: 12/24/18  5:15 PM  Result Value Ref Range   Glucose-Capillary 137 (H) 70 - 99 mg/dL  Glucose, capillary     Status: Abnormal   Collection Time: 12/24/18  8:24 PM  Result Value Ref Range   Glucose-Capillary 143 (H) 70 - 99 mg/dL  Comprehensive metabolic panel     Status: Abnormal   Collection Time: 12/25/18  2:16 AM  Result Value Ref Range   Sodium 135 135 - 145 mmol/L   Potassium 2.9 (L) 3.5 - 5.1 mmol/L   Chloride 99 98 - 111 mmol/L   CO2 25 22 - 32 mmol/L   Glucose, Bld 111 (H) 70 - 99 mg/dL   BUN 7 (L) 8 - 23 mg/dL   Creatinine, Ser 1.17 0.61 - 1.24 mg/dL   Calcium 9.2 8.9 - 10.3 mg/dL   Total Protein 5.5 (L) 6.5 - 8.1 g/dL   Albumin 2.8 (L) 3.5 - 5.0 g/dL   AST 46 (H) 15 - 41 U/L   ALT 21 0 - 44 U/L   Alkaline Phosphatase 57 38 - 126 U/L   Total Bilirubin 1.4 (H) 0.3 - 1.2 mg/dL   GFR calc non Af Amer 59 (L) >60 mL/min   GFR calc Af Amer >60 >60 mL/min   Anion gap 11 5 - 15  CBC     Status: Abnormal   Collection Time: 12/25/18  2:16 AM  Result Value Ref Range   WBC 7.2 4.0 - 10.5 K/uL   RBC 3.51 (L) 4.22 - 5.81 MIL/uL   Hemoglobin 10.6 (L) 13.0 - 17.0 g/dL   HCT 31.0 (L) 39.0 - 52.0 %   MCV 88.3 80.0 - 100.0 fL   MCH 30.2 26.0 - 34.0 pg   MCHC 34.2 30.0 - 36.0 g/dL   RDW 21.7 (H) 11.5 - 15.5 %   Platelets 114 (L) 150 - 400 K/uL   nRBC 0.0 0.0 - 0.2 %  Magnesium     Status: Abnormal   Collection Time: 12/25/18  2:16 AM  Result Value Ref Range  Magnesium 1.6 (L) 1.7 - 2.4 mg/dL  Glucose, capillary     Status: Abnormal   Collection Time: 12/25/18  7:43 AM  Result Value Ref Range   Glucose-Capillary 164 (H) 70 - 99 mg/dL    Studies/Results: No results found.    Assessment: Ascites  The patient had a CT scan of the abdomen done on July 15 at [place]. This showed moderate amount of ascites of uncertain etiology. No clear I suggest hepatic cirrhosis. Cholelithiasis with no biliary dilatation. Suggestion of some mucosal fold thickening throughout the stomach likely representing gastritis.  Recent cytology on ascitic fluid negative for malignant cells.  Plan:   He is stable and I think he can go home. He can follow up with Dr. Michail Sermon who is his regular gastroenterologist. To manage his ascites up until then I would place him on Lasix 40 mg by mouth twice a day and Aldactone 50 mg by mouth twice a day. He should call to make a follow-up with Dr. Michail Sermon in the next couple of weeks.    SAM F Rocklin Soderquist 12/25/2018, 11:00 AM  Pager: [phone] If no answer or after 5 PM call [phone]

## 2018-12-25 NOTE — Plan of Care (Signed)
Nutrition Education Note RD working remotely.  RD consulted for nutrition education regarding a Low Sodium Diet.   RD will provide via mail "Low Sodium Nutrition Therapy" handout from the Academy of [employer] and Dietetics. Education provided to wife of patient on the phone. Reviewed patient's dietary recall. Discussed the benefits of reducing the amount of sodium in the diet. Provided examples of foods considered high and low in sodium. Educated on reading the nutrition facts label. Discouraged intake of processed foods and use of salt shaker. Encouraged fresh fruits and vegetables.Teach back method used.  Expect good compliance.  Body mass index is 26.37 kg/m. Pt meets criteria for overweight based on current BMI.  Current diet order is 2gm Na, patient is consuming approximately 80% of meals at this time. Labs and medications reviewed. No further nutrition interventions warranted at this time. RD contact information provided. If additional nutrition issues arise, please re-consult RD.  [name], RD, LDN  After Hours/Weekend Pager: [phone]

## 2018-12-25 NOTE — Progress Notes (Addendum)
[facility] PROGRESS NOTE  [name] GMW:[id number] DOB: 08/30/1940 DOA: 12/23/2018 PCP: Merrilee Seashore, MD  Assessment/Plan: Cirrhosis with recurrent ascites. Recent diagnosis of cirrhosis of uncertain etiology - NASH vs. Alcoholic. S/p paracentesis 12/23/18 with removal of 6L fluid.  Evaluated by Dr. Penelope Coop GI who recommends 2gm sodium diet as well as spironolactone and lasix. Patient reports feeling as though fluid building again. Of note, per GI,  CT done 7/15 at [facility] revealed moderate amount of ascites of uncertain etiology, cholelithiasis with no biliary dilation. In addition recent cytology on ascitic fluid negative. Hep panel negative today. He is afebrile and non-toxic appearing -continue lasix and spironolactone.  -MELD/MELD-Na score is 22/27, with a mortality rate of 19.6%  Hypokalemia.   Remains low inspite of repletion.  -replete again -recheck this afternoon -ideally would like 4 (on flecinide)  Hypomagnesemia. Remains low inspite of repletion yesterday -replete again -recheck this afternoon -recheck in am as well  QT prolongation. Likely related to electrolyte imbalance in setting of Flecainide.  -monitor on tele -replete electrolytes as noted above -repeat EKG improved after Mg and K repleation    Renal dysfunction.  Presumably AKI but baseline not clear. Creatinine within limits of normal today. Urine output good -monitor closely   RA -He does not appear to be taking medication for this issue at this time.   DM HgA1c 6.2 last year. Home meds include glucophage.  -hold oral agents -Cover with moderate-scale SSI   Afib rate controlled. -Continue Flecainide. -stop Lopressor as BP soft -continue eliquis   Hypothyroidism -Continue Synthroid at current dose for now   PT eval    code Status: full Family Communication: Dr. Eliseo Squires spoke with wife and son Disposition Plan: home when ready   Consultants: Penelope Coop [facility]  Procedures: paracentesis  8/3  Antibiotics:   HPI/Subjective: Awake alert. Denies pain/discomfort  78 yo admitted with recurrent ascites. Repeat paracentesis 8/3 with 6L removed. This had accumulated since 7/20 when 4L removed. Hep panel negative. Gi on board. Now with hypokalemia, hypomagnesemia, QT prolongation. Repleting. Ready for discharge when electrolytes resolved.    Objective: Vitals:   12/25/18 0533 12/25/18 1140  BP: 106/64 115/86  Pulse: 72 74  Resp: 15 18  Temp: 98.1 F (36.7 C) 97.8 F (36.6 C)  SpO2: 100% 99%    Intake/Output Summary (Last 24 hours) at 12/25/2018 1243 Last data filed at 12/25/2018 1141 Gross per 24 hour  Intake 740 ml  Output 975 ml  Net -235 ml   Filed Weights   12/23/18 1300 12/23/18 1551  Weight: 74.5 kg 74.1 kg    Exam:  General:  Awake alert oriented x3. No acute distress Cardiovascular: rrr no mgr no LE edema Respiratory: normal effort BS clear bilaterally no wheeze Abdomen: distended slightly firm +BS no guarding or rebounding Musculoskeletal: joints without swelling/erythema   Data Reviewed: Basic Metabolic Panel: Recent Labs  Lab 12/23/18 1010 12/24/18 0238 12/24/18 1518 12/25/18 0216  NA 129* 132*  --  135  K 5.2* 2.7* 3.1* 2.9*  CL 96* 100  --  99  CO2 20* 23  --  25  GLUCOSE 116* 119*  --  111*  BUN 11 8  --  7*  CREATININE 1.47* 1.21  --  1.17  CALCIUM 9.5 9.1  --  9.2  MG  --  1.2*  --  1.6*   Liver Function Tests: Recent Labs  Lab 12/23/18 1010 12/24/18 0238 12/25/18 0216  AST 76* 36 46*  ALT 25 17  21  ALKPHOS 75 54 57  BILITOT 1.8* 1.2 1.4*  PROT 6.5 5.5* 5.5*  ALBUMIN 2.8* 2.9* 2.8*   No results for input(s): LIPASE, AMYLASE in the last 168 hours. Recent Labs  Lab 12/23/18 1546  AMMONIA 29   CBC: Recent Labs  Lab 12/23/18 1010 12/24/18 0238 12/25/18 0216  WBC 6.7 6.3 7.2  HGB 11.6* 10.0* 10.6*  HCT 35.0* 28.8* 31.0*  MCV 90.7 87.0 88.3  PLT 146* 118* 114*   Cardiac Enzymes: No results for input(s):  CKTOTAL, CKMB, CKMBINDEX, TROPONINI in the last 168 hours. BNP (last 3 results) No results for input(s): BNP in the last 8760 hours.  ProBNP (last 3 results) No results for input(s): PROBNP in the last 8760 hours.  CBG: Recent Labs  Lab 12/24/18 1219 12/24/18 1715 12/24/18 2024 12/25/18 0743 12/25/18 1225  GLUCAP 138* 137* 143* 164* 164*    Recent Results (from the past 240 hour(s))  SARS Coronavirus 2 [facility] order, Performed in [place] hospital lab)     Status: None   Collection Time: 12/23/18  1:44 PM  Result Value Ref Range Status   SARS Coronavirus 2 NEGATIVE NEGATIVE Final    Comment: (NOTE) If result is NEGATIVE SARS-CoV-2 target nucleic acids are NOT DETECTED. The SARS-CoV-2 RNA is generally detectable in upper and lower  respiratory specimens during the acute phase of infection. The lowest  concentration of SARS-CoV-2 viral copies this assay can detect is 250  copies / mL. A negative result does not preclude SARS-CoV-2 infection  and should not be used as the sole basis for treatment or other  patient management decisions.  A negative result may occur with  improper specimen collection / handling, submission of specimen other  than nasopharyngeal swab, presence of viral mutation(s) within the  areas targeted by this assay, and inadequate number of viral copies  (<250 copies / mL). A negative result must be combined with clinical  observations, patient history, and epidemiological information. If result is POSITIVE SARS-CoV-2 target nucleic acids are DETECTED. The SARS-CoV-2 RNA is generally detectable in upper and lower  respiratory specimens dur ing the acute phase of infection.  Positive  results are indicative of active infection with SARS-CoV-2.  Clinical  correlation with patient history and other diagnostic information is  necessary to determine patient infection status.  Positive results do  not rule out bacterial infection or co-infection with other  viruses. If result is PRESUMPTIVE POSTIVE SARS-CoV-2 nucleic acids MAY BE PRESENT.   A presumptive positive result was obtained on the submitted specimen  and confirmed on repeat testing.  While 2019 novel coronavirus  (SARS-CoV-2) nucleic acids may be present in the submitted sample  additional confirmatory testing may be necessary for epidemiological  and / or clinical management purposes  to differentiate between  SARS-CoV-2 and other Sarbecovirus currently known to infect humans.  If clinically indicated additional testing with an alternate test  methodology [phone]) is advised. The SARS-CoV-2 RNA is generally  detectable in upper and lower respiratory sp ecimens during the acute  phase of infection. The expected result is Negative. Fact Sheet for Patients:  StrictlyIdeas.no Fact Sheet for Healthcare Providers: BankingDealers.co.za This test is not yet approved or cleared by the [place] FDA and has been authorized for detection and/or diagnosis of SARS-CoV-2 by FDA under an Emergency Use Authorization (EUA).  This EUA will remain in effect (meaning this test can be used) for the duration of the COVID-19 declaration under Section 564(b)(1) of the Act,  21 U.S.C. section 360bbb-3(b)(1), unless the authorization is terminated or revoked sooner. Performed at [facility], [place] [address] [place], [place] [address]   Culture, body fluid-bottle     Status: None (Preliminary result)   Collection Time: 12/23/18  2:44 PM   Specimen: Peritoneal Washings  Result Value Ref Range Status   Specimen Description PERITONEAL  Final   Special Requests NONE  Final   Culture   Final    NO GROWTH 2 DAYS Performed at [facility] [address], [place], [place] [address]    Report Status PENDING  Incomplete  Gram stain     Status: None   Collection Time: 12/23/18  2:44 PM   Specimen: Peritoneal Washings  Result Value Ref Range  Status   Specimen Description PERITONEAL  Final   Special Requests NONE  Final   Gram Stain   Final    RARE WBC PRESENT, PREDOMINANTLY MONONUCLEAR NO ORGANISMS SEEN Performed at [facility], [address], [place], [place] [address]    Report Status 12/23/2018 FINAL  Final     Studies: US Paracentesis  Result Date: 12/23/2018 INDICATION: Patient with recently diagnosed cirrhosis, followed by [facility]. Previous paracentesis 12/10/2018 in IR yielding 4 L peritoneal fluid which was requested maximum. Patient presented to ER today with complaints of shortness of breath, bilateral lower extremity edema and abdominal distension. Request IR for diagnostic and therapeutic paracentesis with 8 L maximum. EXAM: ULTRASOUND GUIDED diagnostic and therapeutic PARACENTESIS MEDICATIONS: 15 mL 1% lidocaine COMPLICATIONS: None immediate. PROCEDURE: Informed written consent was obtained from the patient after a discussion of the risks, benefits and alternatives to treatment. A timeout was performed prior to the initiation of the procedure. Initial ultrasound scanning demonstrates a large amount of ascites within the right upper abdominal quadrant. The right upper abdomen was prepped and draped in the usual sterile fashion. Of note previous right lower quadrant paracentesis site with Tegaderm and gauze still present which was removed prior to procedure today, several areas of scabbing surrounding previous paracentesis site. Previous paracentesis site intact no leakage, no bleeding. 1% lidocaine was used for local anesthesia. Following this, a 19 gauge, 7-cm, Yueh catheter was introduced. An ultrasound image was saved for documentation purposes. The paracentesis was performed. The catheter was removed and a dressing was applied. The patient tolerated the procedure well without immediate post procedural complication. FINDINGS: A total of approximately 6.1 L of clear yellow fluid was removed. Samples were sent to the  laboratory as requested by the clinical team. IMPRESSION: Successful ultrasound-guided paracentesis yielding 6.1 liters of peritoneal fluid. Read by Candiss Norse, PA-C Electronically Signed   By: Lucrezia Europe M.D.   On: 12/23/2018 15:13    Scheduled Meds:  apixaban  5 mg Oral BID   docusate sodium  100 mg Oral BID   flecainide  50 mg Oral BID   folic acid  1 mg Oral Daily   furosemide  40 mg Oral Daily   insulin aspart  0-15 Units Subcutaneous TID WC   insulin aspart  0-5 Units Subcutaneous QHS   levothyroxine  100 mcg Oral QAC breakfast   multivitamin with minerals  1 tablet Oral Daily   potassium chloride  40 mEq Oral BID WC   rosuvastatin  10 mg Oral Daily   spironolactone  50 mg Oral Daily   vitamin B-12  1,000 mcg Oral Daily   Continuous Infusions:   Principal Problem:   Ascites Active Problems:  Hypokalemia   Liver cirrhosis secondary to NASH (HCC)   Hypomagnesemia   QT prolongation   Type 2 diabetes mellitus with other specified complication (HCC)   PAF (paroxysmal atrial fibrillation) (HCC)   Renal dysfunction   Rheumatoid arthritis ([place])   Hypothyroidism    Time spent: 14 minutes    [place] NP  [facility]  If 7PM-7AM, please contact night-coverage at [email or web address], password [facility] 12/25/2018, 12:43 PM  LOS: 1 day

## 2018-12-26 DIAGNOSIS — R9431 Abnormal electrocardiogram [ECG] [EKG]: Secondary | ICD-10-CM

## 2018-12-26 DIAGNOSIS — K7460 Unspecified cirrhosis of liver: Secondary | ICD-10-CM

## 2018-12-26 DIAGNOSIS — K7581 Nonalcoholic steatohepatitis (NASH): Principal | ICD-10-CM

## 2018-12-26 LAB — COMPREHENSIVE METABOLIC PANEL
ALT: 27 U/L (ref 0–44)
AST: 63 U/L — ABNORMAL HIGH (ref 15–41)
Albumin: 2.7 g/dL — ABNORMAL LOW (ref 3.5–5.0)
Alkaline Phosphatase: 61 U/L (ref 38–126)
Anion gap: 10 (ref 5–15)
BUN: 5 mg/dL — ABNORMAL LOW (ref 8–23)
CO2: 23 mmol/L (ref 22–32)
Calcium: 8.9 mg/dL (ref 8.9–10.3)
Chloride: 101 mmol/L (ref 98–111)
Creatinine, Ser: 0.94 mg/dL (ref 0.61–1.24)
GFR calc Af Amer: >60 mL/min (ref >60)
GFR calc non Af Amer: >60 mL/min (ref >60)
Glucose, Bld: 116 mg/dL — ABNORMAL HIGH (ref 70–99)
Potassium: 3.9 mmol/L (ref 3.5–5.1)
Sodium: 134 mmol/L — ABNORMAL LOW (ref 135–145)
Total Bilirubin: 1.3 mg/dL — ABNORMAL HIGH (ref 0.3–1.2)
Total Protein: 5.6 g/dL — ABNORMAL LOW (ref 6.5–8.1)

## 2018-12-26 LAB — CBC
HCT: 32.2 % — ABNORMAL LOW (ref 39.0–52.0)
Hemoglobin: 10.8 g/dL — ABNORMAL LOW (ref 13.0–17.0)
MCH: 29.9 pg (ref 26.0–34.0)
MCHC: 33.5 g/dL (ref 30.0–36.0)
MCV: 89.2 fL (ref 80.0–100.0)
Platelets: 106 10*3/uL — ABNORMAL LOW (ref 150–400)
RBC: 3.61 MIL/uL — ABNORMAL LOW (ref 4.22–5.81)
RDW: 21.9 % — ABNORMAL HIGH (ref 11.5–15.5)
WBC: 7.8 10*3/uL (ref 4.0–10.5)
nRBC: 0 % (ref 0.0–0.2)

## 2018-12-26 LAB — GLUCOSE, CAPILLARY
Glucose-Capillary: 96 mg/dL (ref 70–99)
Glucose-Capillary: 187 mg/dL — ABNORMAL HIGH (ref 70–99)

## 2018-12-26 MED ORDER — DOCUSATE SODIUM 100 MG PO CAPS: 100.0000 mg | ORAL_CAPSULE | Freq: Two times a day (BID) | ORAL | 0 refills | Status: AC

## 2018-12-26 MED ORDER — SPIRONOLACTONE 50 MG PO TABS: 50.0000 mg | ORAL_TABLET | Freq: Two times a day (BID) | ORAL | 1 refills | Status: AC

## 2018-12-26 MED ORDER — MAGNESIUM OXIDE 400 (241.3 MG) MG PO TABS: 400.0000 mg | ORAL_TABLET | Freq: Two times a day (BID) | ORAL | 1 refills | Status: AC

## 2018-12-26 NOTE — Evaluation (Signed)
Physical Therapy Evaluation Patient Details Name: [name] MRN: [id number] DOB: [birth year] Today's Date: 12/26/2018   History of Present Illness  Pt is a 78 y.o. admitted 12/23/18 with worsening ascities. S/p paracentesis on 12/10/18 and again 12/23/18. Pt also with hypokalemia and hypomagnesemia. PMH includes RA, OA, liver cirrhosis secondary to NASH, HTN, DM.    Clinical Impression  Pt presents with an overall decrease in functional mobility secondary to above. PTA, pt independent and lives with wife. Today, pt able to ambulate with RW and intermittent min guard for balance; stability improved with RW use, but pt limited by fatigue and c/o abdominal pain. Educ on abdominal precautions for comfort with mobility, importance of more frequent ambulation; encouraged OOB mobility with nursing staff. Pt would benefit from continued acute PT services to maximize functional mobility and independence prior to d/c with HHPT services.     Follow Up Recommendations Home health PT;Supervision for mobility/OOB    Equipment Recommendations  Other (comment)(shower seat)    Recommendations for Other Services       Precautions / Restrictions Precautions Precautions: Fall Restrictions Weight Bearing Restrictions: No      Mobility  Bed Mobility Overal bed mobility: Modified Independent             General bed mobility comments: Pt requesting assist, but able to sit himself when cued for log roll for abdominal comfort; HOB slightly elevated  Transfers Overall transfer level: Needs assistance Equipment used: Rolling walker (2 wheeled) Transfers: Sit to/from Stand Sit to Stand: Min guard         General transfer comment: Reliant on momentum to power into standing, self-corrected hand placement without cues. Min guard for balance  Ambulation/Gait Ambulation/Gait assistance: Min guard;Supervision Gait Distance (Feet): 20 Feet Assistive device: Rolling walker (2 wheeled) Gait  Pattern/deviations: Step-through pattern;Decreased stride length Gait velocity: Decreased   General Gait Details: Slow, fatigued gait with RW and initial min guard progressing to supervision for safety. Pt c/o dizziness requiring seated rest; declined further mobility due to fatigue. BP 128/100  Stairs            Wheelchair Mobility    Modified Rankin (Stroke Patients Only)       Balance Overall balance assessment: Needs assistance   Sitting balance-Leahy Scale: Good       Standing balance-Leahy Scale: Fair Standing balance comment: Can static stand without UE support, dynamic stability improved with RW                             Pertinent Vitals/Pain Pain Assessment: Faces Faces Pain Scale: Hurts little more Pain Location: Abdomen Pain Descriptors / Indicators: Guarding;Discomfort Pain Intervention(s): Monitored during session    Home Living Family/patient expects to be discharged to:: Private residence Living Arrangements: Spouse/significant other Available Help at Discharge: Family;Available 24 hours/day Type of Home: House Home Access: Stairs to enter   [employer] of Steps: 2-3 Home Layout: Two level;Able to live on main level with bedroom/bathroom Home Equipment: Gilford Rile - 2 wheels;Cane - single point Additional Comments: Wife set up bed on main floor for pt to use. Wife in good health; sons also available for assist    Prior Function Level of Independence: Independent         Comments: Pt reports SPC use for community ambulation, sounds like 'furniture surfing' in house. 1x recent fall outside clinic two weeks ago. Retired professor     Journalist, newspaper  Extremity/Trunk Assessment   Upper Extremity Assessment Upper Extremity Assessment: Generalized weakness    Lower Extremity Assessment Lower Extremity Assessment: Generalized weakness       Communication   Communication: No difficulties  Cognition  Arousal/Alertness: Awake/alert Behavior During Therapy: WFL for tasks assessed/performed Overall Cognitive Status: Within Functional Limits for tasks assessed                                 General Comments: WFL for simple tasks; engaging in conversation and answering questions appropriately. not formally tested      General Comments General comments (skin integrity, edema, etc.): Discussed recommendation for use of RW upon initial return home    Exercises     Assessment/Plan    PT Assessment Patient needs continued PT services  PT Problem List Decreased strength;Decreased activity tolerance;Decreased balance;Decreased mobility;Decreased knowledge of use of DME;Pain       PT Treatment Interventions DME instruction;Gait training;Stair training;Functional mobility training;Therapeutic activities;Therapeutic exercise;Balance training;Patient/family education    PT Goals (Current goals can be found in the Care Plan section)  Acute Rehab PT Goals Patient Stated Goal: Return home PT Goal Formulation: With patient Time For Goal Achievement: 01/09/19 Potential to Achieve Goals: Good    Frequency Min 3X/week   Barriers to discharge        Co-evaluation               AM-PAC PT "6 Clicks" Mobility  Outcome Measure Help needed turning from your back to your side while in a flat bed without using bedrails?: None Help needed moving from lying on your back to sitting on the side of a flat bed without using bedrails?: None Help needed moving to and from a bed to a chair (including a wheelchair)?: A Little Help needed standing up from a chair using your arms (e.g., wheelchair or bedside chair)?: A Little Help needed to walk in hospital room?: A Little Help needed climbing 3-5 steps with a railing? : A Little 6 Click Score: 20    End of Session Equipment Utilized During Treatment: Gait belt Activity Tolerance: Patient tolerated treatment well;Patient limited by  fatigue Patient left: in chair;with call bell/phone within reach Nurse Communication: Mobility status PT Visit Diagnosis: Other abnormalities of gait and mobility (R26.89);Muscle weakness (generalized) (M62.81)    Time: 6144-3154 PT Time Calculation (min) (ACUTE ONLY): 21 min   Charges:   PT Evaluation $PT Eval Moderate Complexity: 1 Mod     Mabeline Caras, PT, DPT Acute Rehabilitation Services  Pager [phone] Office [place] 12/26/2018, 9:10 AM

## 2018-12-26 NOTE — Progress Notes (Signed)
Pt was educated about medications and their scheduled was reviewed. Pt was educated about to schedule appointment with GI doctor and PCP within next two weeks. Pt is stable and ready to go home. Pt's wife is at the bedside

## 2018-12-26 NOTE — Discharge Summary (Addendum)
Physician Discharge Summary [facility]    Patient: [name]                   Admit date: 12/23/2018   DOB: 1940-06-22             Discharge date:12/26/2018/12:11 PM ERD:[id number]                          PCP: Merrilee Seashore, MD  Disposition: Home  Recommendations for Outpatient Follow-up:   . Follow up: in 1 week follow up with Dr. Michail Sermon  Discharge Condition: Stable   Code Status:   Code Status: Full Code  Diet recommendation: Diabetic diet   Discharge Diagnoses:    Principal Problem:   Ascites Active Problems:   Rheumatoid arthritis ([place])   Type 2 diabetes mellitus with other specified complication ([place])   PAF (paroxysmal atrial fibrillation) ([place])   Hypokalemia   Hypothyroidism   Renal dysfunction   Liver cirrhosis secondary to NASH ([place])   Hypomagnesemia   QT prolongation   History of Present Illness/ Hospital Course [name] Summary:   [name] is a 78 y.o. male here with recurrent ascites.  Got paracentesis on 7/20-- 4L.  Re-accumulated and 6.1L was removed on 8/2.  Cr initially was elevated but improved after paracentesis.    No formal outpatient work up yet pursued.  Follows with [facility].  Will get viral hepatitis panel.  Concern over the rapid accumulation of fluid but patient was not following a low Na diet until 1 week ago and is on lasix 40 and aldactone 25--- will increased this to 50.  Close monitoring of his electrolytes.   Cirrhosis with recurrent ascites. Recent diagnosis of cirrhosis of uncertain etiology - NASH vs. Alcoholic. S/p paracentesis 12/23/18 with removal of 6L fluid.   Evaluated by Dr. Penelope Coop GI who recommends 2gm sodium diet as well as spironolactone increased from 25 to 50 mg p.o. twice daily, and lasix.  -Hep panel -nonreactive -MELD/MELD-Na score is 22/27, with a mortality rate of 19.6%  Renal dysfunction.   -Creatinine improved back to baseline Presumably AKI but baseline not clear. Creatinine  trending downward.    RA -He does not appear to be taking medication for this issue at this time.  DM HgA1c 6.2 last year. Home meds include glucophage.  -Resume home medication including metformin, diabetic diet -Metformin was held during hospital course, patient was covered with SSI  Afib rate controlled. -Flecainide was D/Ced -due to QTC prolongation -patient's cardiology was curb sided recommended to DC/hold  flecainide at this point.  Patient has not received any dose in the past 24 hours. - Cont. Lopressor for rate control -continue eliquis  Hypothyroidism -Continue Synthroid at current dose for now  Hypokalemia. Potassium 2.7. likely as result of yesterdays paracentesis removing 6L fluids.  -repleted -recheck in am  Hypomagnesemia. See above. Mag level 1.2 -repleted    Code Status: full Family Communication: patient Disposition Plan: home   Consultants:  Dr. Penelope Coop GI  Procedures:  Paracentesis 8/3   Paracentesis 7/20     Discharge Instructions:   Discharge Instructions    Activity as tolerated - No restrictions   Complete by: As directed    Diet - low sodium heart healthy   Complete by: As directed    Discharge instructions   Complete by: As directed    Please follow-up with Dr. Michail Sermon GI for further evaluation as an outpatient  Increase activity slowly   Complete by: As directed        Medication List    STOP taking these medications   acetaminophen 325 MG tablet Commonly known as: TYLENOL   flecainide 50 MG tablet Commonly known as: TAMBOCOR     TAKE these medications   docusate sodium 100 MG capsule Commonly known as: COLACE Take 1 capsule (100 mg total) by mouth 2 (two) times daily.   Eliquis 5 MG Tabs tablet Generic drug: apixaban Take 1 tablet by mouth twice daily What changed: how much to take   folic acid 1 MG tablet Commonly known as: FOLVITE Take 1 tablet (1 mg total) by mouth daily.   furosemide 40 MG  tablet Commonly known as: LASIX Take 40 mg by mouth.   levothyroxine 100 MCG tablet Commonly known as: SYNTHROID Take 100 mcg by mouth daily before breakfast.   magnesium oxide 400 (241.3 Mg) MG tablet Commonly known as: MAG-OX Take 1 tablet (400 mg total) by mouth 2 (two) times daily.   metFORMIN 500 MG tablet Commonly known as: GLUCOPHAGE Take 500-1,000 mg by mouth See admin instructions. Take 1 tablet (535m) by mouth once daily in the morning, and 2 tablets (10026m in the evening   metoprolol tartrate 25 MG tablet Commonly known as: LOPRESSOR Take 1 tablet by mouth twice daily   multivitamin tablet Take 1 tablet by mouth daily.   rosuvastatin 10 MG tablet Commonly known as: CRESTOR Take 1 tablet (10 mg total) by mouth daily.   spironolactone 50 MG tablet Commonly known as: ALDACTONE Take 1 tablet (50 mg total) by mouth 2 (two) times daily. What changed:   medication strength  how much to take  when to take this   vitamin B-12 1000 MCG tablet Commonly known as: CYANOCOBALAMIN Take 1,000 mcg by mouth daily.       Allergies  Allergen Reactions  . Hydrocodone Nausea Only     Procedures /Studies:   [place]  Result Date: 12/23/2018 INDICATION: Patient with recently diagnosed cirrhosis, followed by [facility]. Previous paracentesis 12/10/2018 in IR yielding 4 L peritoneal fluid which was requested maximum. Patient presented to ER today with complaints of shortness of breath, bilateral lower extremity edema and abdominal distension. Request IR for diagnostic and therapeutic paracentesis with 8 L maximum. EXAM: ULTRASOUND GUIDED diagnostic and therapeutic PARACENTESIS MEDICATIONS: 15 mL 1% lidocaine COMPLICATIONS: None immediate. PROCEDURE: Informed written consent was obtained from the patient after a discussion of the risks, benefits and alternatives to treatment. A timeout was performed prior to the initiation of the procedure. Initial ultrasound scanning  demonstrates a large amount of ascites within the right upper abdominal quadrant. The right upper abdomen was prepped and draped in the usual sterile fashion. Of note previous right lower quadrant paracentesis site with Tegaderm and gauze still present which was removed prior to procedure today, several areas of scabbing surrounding previous paracentesis site. Previous paracentesis site intact no leakage, no bleeding. 1% lidocaine was used for local anesthesia. Following this, a 19 gauge, 7-cm, Yueh catheter was introduced. An ultrasound image was saved for documentation purposes. The paracentesis was performed. The catheter was removed and a dressing was applied. The patient tolerated the procedure well without immediate post procedural complication. FINDINGS: A total of approximately 6.1 L of clear yellow fluid was removed. Samples were sent to the laboratory as requested by the clinical team. IMPRESSION: Successful ultrasound-guided paracentesis yielding 6.1 liters of peritoneal fluid. Read by ShCandiss NorsePA-C Electronically Signed  By: Lucrezia Europe M.D.   On: 12/23/2018 15:13   Dg Chest Port 1 View  Result Date: 12/23/2018 CLINICAL DATA:  Shortness of breath for 1 week. EXAM: PORTABLE CHEST 1 VIEW COMPARISON:  August 14, 2017 FINDINGS: Mediastinal contour and cardiac silhouette are normal. Lung volumes are low. Minimal atelectasis of left lung base is identified. There is no focal pneumonia or pulmonary edema. The bony structures are stable. IMPRESSION: Minimal atelectasis of left lung base.  No focal pneumonia. Electronically Signed   By: Abelardo Diesel M.D.   On: 12/23/2018 10:49   Ir Paracentesis  Result Date: 12/10/2018 INDICATION: Patient with edema, ascites. Request is made for diagnostic and therapeutic paracentesis. EXAM: ULTRASOUND GUIDED DIAGNOSTIC AND THERAPEUTIC PARACENTESIS MEDICATIONS: 10 mL 1% lidocaine COMPLICATIONS: None immediate. PROCEDURE: Informed written consent was obtained from  the patient after a discussion of the risks, benefits and alternatives to treatment. A timeout was performed prior to the initiation of the procedure. Initial ultrasound scanning demonstrates a moderate amount of ascites within the right lower abdominal quadrant. The right lower abdomen was prepped and draped in the usual sterile fashion. 1% lidocaine was used for local anesthesia. Following this, a 19 gauge, 7-cm, Yueh catheter was introduced. An ultrasound image was saved for documentation purposes. The paracentesis was performed. The catheter was removed and a dressing was applied. The patient tolerated the procedure well without immediate post procedural complication. FINDINGS: A total of approximately 4.0 liters of yellow fluid was removed. Samples were sent to the laboratory as requested by the clinical team. IMPRESSION: Successful ultrasound-guided diagnostic and therapeutic paracentesis yielding 4.0 liters of peritoneal fluid. Read by: Brynda Greathouse PA-C Electronically Signed   By: Aletta Edouard M.D.   On: 12/10/2018 14:30     Subjective:   Patient was seen and examined 12/26/2018, 12:11 PM Patient stable today. No acute distress.  No issues overnight Stable for discharge.  Discharge Exam:    Vitals:   12/25/18 1409 12/25/18 2042 12/26/18 0508 12/26/18 0600  BP: 108/64 101/65 111/66   Pulse: 81 86 86   Resp: 18 17 19    Temp: 98.2 F (36.8 C) 98 F (36.7 C) 97.9 F (36.6 C)   TempSrc: Oral Oral Oral   SpO2: 100% 100% 100%   Weight:    72.8 kg  Height:        General: Pt lying comfortably in bed & appears in no obvious distress. Cardiovascular: S1 & S2 heard, RRR, S1/S2 +. No murmurs, rubs, gallops or clicks. No JVD or pedal edema. Respiratory: Clear to auscultation without wheezing, rhonchi or crackles. No increased work of breathing. Abdominal:  Non-distended, non-tender & soft. No organomegaly or masses appreciated. Normal bowel sounds heard. CNS: Alert and oriented. No  focal deficits. Extremities: no edema, no cyanosis    The results of significant diagnostics from this hospitalization (including imaging, microbiology, ancillary and laboratory) are listed below for reference.      Microbiology:   Recent Results (from the past 240 hour(s))  SARS Coronavirus 2 [facility] order, Performed in [facility] hospital lab)     Status: None   Collection Time: 12/23/18  1:44 PM  Result Value Ref Range Status   SARS Coronavirus 2 NEGATIVE NEGATIVE Final    Comment: (NOTE) If result is NEGATIVE SARS-CoV-2 target nucleic acids are NOT DETECTED. The SARS-CoV-2 RNA is generally detectable in upper and lower  respiratory specimens during the acute phase of infection. The lowest  concentration of SARS-CoV-2 viral copies this assay  can detect is 250  copies / mL. A negative result does not preclude SARS-CoV-2 infection  and should not be used as the sole basis for treatment or other  patient management decisions.  A negative result may occur with  improper specimen collection / handling, submission of specimen other  than nasopharyngeal swab, presence of viral mutation(s) within the  areas targeted by this assay, and inadequate number of viral copies  (<250 copies / mL). A negative result must be combined with clinical  observations, patient history, and epidemiological information. If result is POSITIVE SARS-CoV-2 target nucleic acids are DETECTED. The SARS-CoV-2 RNA is generally detectable in upper and lower  respiratory specimens dur ing the acute phase of infection.  Positive  results are indicative of active infection with SARS-CoV-2.  Clinical  correlation with patient history and other diagnostic information is  necessary to determine patient infection status.  Positive results do  not rule out bacterial infection or co-infection with other viruses. If result is PRESUMPTIVE POSTIVE SARS-CoV-2 nucleic acids MAY BE PRESENT.   A presumptive positive result  was obtained on the submitted specimen  and confirmed on repeat testing.  While 2019 novel coronavirus  (SARS-CoV-2) nucleic acids may be present in the submitted sample  additional confirmatory testing may be necessary for epidemiological  and / or clinical management purposes  to differentiate between  SARS-CoV-2 and other Sarbecovirus currently known to infect humans.  If clinically indicated additional testing with an alternate test  methodology [id number]) is advised. The SARS-CoV-2 RNA is generally  detectable in upper and lower respiratory sp ecimens during the acute  phase of infection. The expected result is Negative. Fact Sheet for Patients:  StrictlyIdeas.no Fact Sheet for Healthcare Providers: BankingDealers.co.za This test is not yet approved or cleared by the [place] FDA and has been authorized for detection and/or diagnosis of SARS-CoV-2 by FDA under an Emergency Use Authorization (EUA).  This EUA will remain in effect (meaning this test can be used) for the duration of the COVID-19 declaration under Section 564(b)(1) of the Act, 21 U.S.C. section 360bbb-3(b)(1), unless the authorization is terminated or revoked sooner. Performed at [facility], [place] [address] [place], [place] [address]   Culture, body fluid-bottle     Status: None (Preliminary result)   Collection Time: 12/23/18  2:44 PM   Specimen: Peritoneal Washings  Result Value Ref Range Status   Specimen Description PERITONEAL  Final   Special Requests NONE  Final   Culture   Final    NO GROWTH 2 DAYS Performed at [place] [address] [place], [place] [address]    Report Status PENDING  Incomplete  Gram stain     Status: None   Collection Time: 12/23/18  2:44 PM   Specimen: Peritoneal Washings  Result Value Ref Range Status   Specimen Description PERITONEAL  Final   Special Requests NONE  Final   Gram Stain   Final    RARE WBC  PRESENT, PREDOMINANTLY MONONUCLEAR NO ORGANISMS SEEN Performed at [facility], [address] [place], [place] [address]    Report Status 12/23/2018 FINAL  Final     Labs:   CBC: Recent Labs  Lab 12/23/18 1010 12/24/18 0238 12/25/18 0216 12/26/18 0222  WBC 6.7 6.3 7.2 7.8  HGB 11.6* 10.0* 10.6* 10.8*  HCT 35.0* 28.8* 31.0* 32.2*  MCV 90.7 87.0 88.3 89.2  PLT 146* 118* 114* 086*   Basic Metabolic Panel: Recent Labs  Lab 12/23/18 1010 12/24/18 0238 12/24/18 1518 12/25/18 0216 12/25/18 1614 12/26/18 0222  NA 129* 132*  --  135  --  134*  K 5.2* 2.7* 3.1* 2.9* 4.4 3.9  CL 96* 100  --  99  --  101  CO2 20* 23  --  25  --  23  GLUCOSE 116* 119*  --  111*  --  116*  BUN 11 8  --  7*  --  5*  CREATININE 1.47* 1.21  --  1.17  --  0.94  CALCIUM 9.5 9.1  --  9.2  --  8.9  MG  --  1.2*  --  1.6* 1.8  --    Liver Function Tests: Recent Labs  Lab 12/23/18 1010 12/24/18 0238 12/25/18 0216 12/26/18 0222  AST 76* 36 46* 63*  ALT 25 17 21 27   ALKPHOS 75 54 57 61  BILITOT 1.8* 1.2 1.4* 1.3*  PROT 6.5 5.5* 5.5* 5.6*  ALBUMIN 2.8* 2.9* 2.8* 2.7*   BNP (last 3 results) No results for input(s): BNP in the last 8760 hours. Cardiac Enzymes: No results for input(s): CKTOTAL, CKMB, CKMBINDEX, TROPONINI in the last 168 hours. CBG: Recent Labs  Lab 12/25/18 1225 12/25/18 1644 12/25/18 2040 12/26/18 0731 12/26/18 1205  GLUCAP 164* 186* 128* 96 187*   Urinalysis    Component Value Date/Time   COLORURINE AMBER (A) 08/14/2017 1154   APPEARANCEUR CLEAR 08/14/2017 1154   LABSPEC 1.019 08/14/2017 1154   PHURINE 6.0 08/14/2017 1154   GLUCOSEU NEGATIVE 08/14/2017 1154   HGBUR LARGE (A) 08/14/2017 1154   BILIRUBINUR NEGATIVE 08/14/2017 1154   KETONESUR NEGATIVE 08/14/2017 1154   PROTEINUR 30 (A) 08/14/2017 1154   NITRITE NEGATIVE 08/14/2017 1154   LEUKOCYTESUR NEGATIVE 08/14/2017 1154    Time coordinating discharge: Over 40 minutes  SIGNED: Deatra James,  MD, FACP, FHM. [facility],  Pager [phone]  If 7PM-7AM, please contact night-coverage [email or web address] [facility] 12/26/2018, 12:11 PM

## 2018-12-28 LAB — CULTURE, BODY FLUID W GRAM STAIN -BOTTLE: Culture: NO GROWTH

## 2019-01-07 ENCOUNTER — Other Ambulatory Visit: Payer: Self-pay | Admitting: Gastroenterology

## 2019-01-07 DIAGNOSIS — R978 Other abnormal tumor markers: Secondary | ICD-10-CM

## 2019-01-07 DIAGNOSIS — R970 Elevated carcinoembryonic antigen [CEA]: Secondary | ICD-10-CM

## 2019-01-07 DIAGNOSIS — K7031 Alcoholic cirrhosis of liver with ascites: Secondary | ICD-10-CM

## 2019-01-07 DIAGNOSIS — R772 Abnormality of alphafetoprotein: Secondary | ICD-10-CM

## 2019-02-04 ENCOUNTER — Ambulatory Visit
Admission: RE | Admit: 2019-02-04 | Discharge: 2019-02-04 | Disposition: A | Discharge status: Home / Self Care | Payer: Medicare Other | Source: Ambulatory Visit | Attending: Gastroenterology | Admitting: Gastroenterology

## 2019-02-04 DIAGNOSIS — K7031 Alcoholic cirrhosis of liver with ascites: Secondary | ICD-10-CM

## 2019-02-04 DIAGNOSIS — R772 Abnormality of alphafetoprotein: Secondary | ICD-10-CM

## 2019-02-04 DIAGNOSIS — R978 Other abnormal tumor markers: Secondary | ICD-10-CM

## 2019-02-04 DIAGNOSIS — R970 Elevated carcinoembryonic antigen [CEA]: Secondary | ICD-10-CM

## 2019-02-04 MED ORDER — GADOBENATE DIMEGLUMINE 529 MG/ML IV SOLN
15.0000 mL | Freq: Once | INTRAVENOUS | Status: AC | PRN
  Administered 2019-02-04: 11:00:00 15 mL via INTRAVENOUS

## 2019-02-21 ENCOUNTER — Other Ambulatory Visit: Payer: Self-pay | Admitting: Cardiology

## 2019-04-10 ENCOUNTER — Ambulatory Visit: Payer: Medicare Other | Admitting: Podiatry

## 2019-04-10 ENCOUNTER — Encounter: Payer: Self-pay | Admitting: Podiatry

## 2019-04-10 ENCOUNTER — Other Ambulatory Visit: Payer: Self-pay

## 2019-04-10 ENCOUNTER — Telehealth: Payer: Self-pay | Admitting: *Deleted

## 2019-04-10 VITALS — BP 95/54

## 2019-04-10 DIAGNOSIS — M79675 Pain in left toe(s): Secondary | ICD-10-CM

## 2019-04-10 DIAGNOSIS — B351 Tinea unguium: Secondary | ICD-10-CM

## 2019-04-10 DIAGNOSIS — L603 Nail dystrophy: Secondary | ICD-10-CM | POA: Diagnosis not present

## 2019-04-10 NOTE — Patient Instructions (Signed)

## 2019-04-10 NOTE — Telephone Encounter (Signed)
Pt's wife, Hilaria Ota states pt had toenail procedure and the toes are slightly bleeding.

## 2019-04-10 NOTE — Telephone Encounter (Signed)
Left message informing pt and wife, Hilaria Ota to reinforce the dressings and rest, if became painfully tonight begin soaks and dressing changes, call with concerns.

## 2019-04-12 ENCOUNTER — Encounter: Payer: Self-pay | Admitting: Podiatry

## 2019-04-12 NOTE — Progress Notes (Signed)
Subjective:  Patient ID: [name], male    DOB: 1940-08-23,  MRN: [id number]  Chief Complaint  Patient presents with  . Foot Pain    pt is here for a nail deformity of the left big toenail, pain has been going on for multiple years, pt also states that the left big toenail pain is elevated when applying pressure    78 y.o. male presents with the above complaint.  Patient states that this left big toenail has been causing him a lot of issues.  He states that it has gotten thickened elongated dystrophic over time.  He states that it partially is come loose after traumas to the left hallux with shoe gears.  He denies seeing anyone else for this.  He states that he would prefer to just have it removed and see how of the toenail will grow back.  He does not want to go on any type of medication to treat the onychomycosis.  He has been ambulating regular sneakers.  He denies any other acute complaints.   Review of Systems: Negative except as noted in the HPI. Denies N/V/F/Ch.  Past Medical History:  Diagnosis Date  . DM (diabetes mellitus) ([place])   . GERD (gastroesophageal reflux disease)   . Hyperlipidemia   . Hypertension   . 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 Liver cirrhosis secondary to NASH ([place])   . Osteoarthritis   . Osteopenia   . Rheumatoid arthritis ([place])   . Thyroid disease     Current Outpatient Medications:  .  diclofenac Sodium (VOLTAREN) 1 % GEL, diclofenac 1 % topical gel, Disp: , Rfl:  .  docusate sodium (COLACE) 100 MG capsule, Take 1 capsule (100 mg total) by mouth 2 (two) times daily., Disp: 10 capsule, Rfl: 0 .  ELIQUIS 5 MG TABS tablet, Take 1 tablet by mouth twice daily, Disp: 180 tablet, Rfl: 2 .  folic acid (FOLVITE) 1 MG tablet, Take 1 tablet (1 mg total) by mouth daily., Disp: 30 tablet, Rfl: 0 .  furosemide (LASIX) 40 MG tablet, Take 40 mg by mouth., Disp: , Rfl:  .  levothyroxine (SYNTHROID) 100 MCG tablet, Take 100 mcg by mouth daily before breakfast. ,  Disp: , Rfl:  .  magnesium oxide (MAG-OX) 400 (241.3 Mg) MG tablet, Take 1 tablet (400 mg total) by mouth 2 (two) times daily., Disp: 60 tablet, Rfl: 1 .  metFORMIN (GLUCOPHAGE) 500 MG tablet, Take 500-1,000 mg by mouth See admin instructions. Take 1 tablet (596m) by mouth once daily in the morning, and 2 tablets (10055m in the evening, Disp: , Rfl:  .  metoprolol tartrate (LOPRESSOR) 25 MG tablet, Take 1 tablet by mouth twice daily (Patient taking differently: Take 25 mg by mouth 2 (two) times daily. ), Disp: 180 tablet, Rfl: 1 .  Multiple Vitamin (MULTIVITAMIN) tablet, Take 1 tablet by mouth daily., Disp: , Rfl:  .  rosuvastatin (CRESTOR) 10 MG tablet, Take 1 tablet (10 mg total) by mouth daily., Disp: 30 tablet, Rfl: 0 .  spironolactone (ALDACTONE) 50 MG tablet, Take 1 tablet (50 mg total) by mouth 2 (two) times daily., Disp: 60 tablet, Rfl: 1 .  vitamin B-12 (CYANOCOBALAMIN) 1000 MCG tablet, Take 1,000 mcg by mouth daily., Disp: , Rfl:   Social History   Tobacco Use  Smoking Status Former Smoker  . Packs/day: 0.50  . Years: 20.00  . Pack years: 10.00  . Types: Cigarettes  . Quit date: 11/02/2014  . Years since quitting: 4.4  Smokeless Tobacco Never Used    Allergies  Allergen Reactions  . Hydrocodone Nausea Only   Objective:   Vitals:   04/10/19 1037  BP: (!) 95/54   There is no height or weight on file to calculate BMI. Constitutional Well developed. Well nourished.  Vascular Dorsalis pedis pulses palpable bilaterally. Posterior tibial pulses palpable bilaterally. Capillary refill normal to all digits.  No cyanosis or clubbing noted. Pedal hair growth normal.  Neurologic Normal speech. Oriented to person, place, and time. Epicritic sensation to light touch grossly present bilaterally.  Dermatologic Pain on palpation of the entire/total nail on 1st digit of the left No other open wounds. No skin lesions.  Orthopedic: Normal joint ROM without pain or crepitus  bilaterally. No visible deformities. No bony tenderness.   Radiographs: None Assessment:  No diagnosis found. Plan:  Patient was evaluated and treated and all questions answered.  Nail dystrophy due to onychomycosis of the hallux, left -Patient elects to proceed with minor surgery to remove entire toenail today. Consent reviewed and signed by patient. -Entire/total nail excised. See procedure note. -Educated on post-procedure care including soaking. Written instructions provided and reviewed. -Patient to follow up in 2 weeks for nail check.  Procedure: Excision of entire/total nail  Location: Left 1st toe digit Anesthesia: Lidocaine 1% plain; 1.5 mL and Marcaine 0.5% plain; 1.5 mL, digital block. Skin Prep: Betadine. Dressing: Silvadene; telfa; dry, sterile, compression dressing. Technique: Following skin prep, the toe was exsanguinated and a tourniquet was secured at the base of the toe. The affected nail border was freed and excised. The tourniquet was then removed and sterile dressing applied. Disposition: Patient tolerated procedure well. Patient to return in 2 weeks for follow-up.   No follow-ups on file.

## 2019-06-03 ENCOUNTER — Ambulatory Visit: Payer: Medicare Other | Attending: Internal Medicine

## 2019-06-03 DIAGNOSIS — Z23 Encounter for immunization: Secondary | ICD-10-CM | POA: Insufficient documentation

## 2019-06-03 NOTE — Progress Notes (Signed)
   Covid-19 Vaccination Clinic  Name:  [name]    MRN: [id number] DOB: June 24, 1940  06/03/2019  Mr. Cicalese was observed post Covid-19 immunization for 30 minutes based on pre-vaccination screening without incidence. He was provided with Vaccine Information Sheet and instruction to access the V-Safe system.   Mr. Goyne was instructed to call 911 with any severe reactions post vaccine: Marland Kitchen Difficulty breathing  . Swelling of your face and throat  . A fast heartbeat  . A bad rash all over your body  . Dizziness and weakness    Immunizations Administered    Name Date Dose VIS Date Route   Pfizer COVID-19 Vaccine 06/03/2019  9:51 AM 0.3 mL 05/03/2019 Intramuscular   Manufacturer: [employer], [employer]   Lot: [id number]   [place]: [id number]

## 2019-06-12 DIAGNOSIS — K7469 Other cirrhosis of liver: Secondary | ICD-10-CM | POA: Diagnosis not present

## 2019-06-12 DIAGNOSIS — N289 Disorder of kidney and ureter, unspecified: Secondary | ICD-10-CM | POA: Diagnosis not present

## 2019-06-12 DIAGNOSIS — Z87898 Personal history of other specified conditions: Secondary | ICD-10-CM | POA: Diagnosis not present

## 2019-06-19 DIAGNOSIS — I951 Orthostatic hypotension: Secondary | ICD-10-CM | POA: Diagnosis not present

## 2019-06-19 DIAGNOSIS — M5136 Other intervertebral disc degeneration, lumbar region: Secondary | ICD-10-CM | POA: Diagnosis not present

## 2019-06-19 DIAGNOSIS — E860 Dehydration: Secondary | ICD-10-CM | POA: Diagnosis not present

## 2019-06-19 DIAGNOSIS — R42 Dizziness and giddiness: Secondary | ICD-10-CM | POA: Diagnosis not present

## 2019-06-23 ENCOUNTER — Ambulatory Visit: Payer: Medicare HMO | Attending: Internal Medicine

## 2019-06-23 DIAGNOSIS — Z23 Encounter for immunization: Secondary | ICD-10-CM | POA: Insufficient documentation

## 2019-06-23 NOTE — Progress Notes (Signed)
   Covid-19 Vaccination Clinic  Name:  [name]    MRN: [id number] DOB: February 03, 1941  06/23/2019  [name] was observed post Covid-19 immunization for 15 minutes without incidence. He was provided with Vaccine Information Sheet and instruction to access the V-Safe system.   [name] was instructed to call 911 with any severe reactions post vaccine: Marland Kitchen Difficulty breathing  . Swelling of your face and throat  . A fast heartbeat  . A bad rash all over your body  . Dizziness and weakness    Immunizations Administered    Name Date Dose VIS Date Route   Pfizer COVID-19 Vaccine 06/23/2019  9:44 AM 0.3 mL 05/03/2019 Intramuscular   Manufacturer: [place]   Lot: [id number]   [place]: [id number]

## 2019-06-24 ENCOUNTER — Other Ambulatory Visit: Payer: Self-pay | Admitting: Cardiology

## 2019-06-26 DIAGNOSIS — N182 Chronic kidney disease, stage 2 (mild): Secondary | ICD-10-CM | POA: Diagnosis not present

## 2019-06-26 DIAGNOSIS — R269 Unspecified abnormalities of gait and mobility: Secondary | ICD-10-CM | POA: Diagnosis not present

## 2019-06-26 DIAGNOSIS — I951 Orthostatic hypotension: Secondary | ICD-10-CM | POA: Diagnosis not present

## 2019-06-26 DIAGNOSIS — E860 Dehydration: Secondary | ICD-10-CM | POA: Diagnosis not present

## 2019-06-26 DIAGNOSIS — R42 Dizziness and giddiness: Secondary | ICD-10-CM | POA: Diagnosis not present

## 2019-06-26 DIAGNOSIS — R531 Weakness: Secondary | ICD-10-CM | POA: Diagnosis not present

## 2019-06-26 DIAGNOSIS — M5136 Other intervertebral disc degeneration, lumbar region: Secondary | ICD-10-CM | POA: Diagnosis not present

## 2019-06-28 DIAGNOSIS — E119 Type 2 diabetes mellitus without complications: Secondary | ICD-10-CM | POA: Diagnosis not present

## 2019-06-28 DIAGNOSIS — I951 Orthostatic hypotension: Secondary | ICD-10-CM | POA: Diagnosis not present

## 2019-06-28 DIAGNOSIS — R531 Weakness: Secondary | ICD-10-CM | POA: Diagnosis not present

## 2019-06-28 DIAGNOSIS — R42 Dizziness and giddiness: Secondary | ICD-10-CM | POA: Diagnosis not present

## 2019-06-28 DIAGNOSIS — M5136 Other intervertebral disc degeneration, lumbar region: Secondary | ICD-10-CM | POA: Diagnosis not present

## 2019-06-28 DIAGNOSIS — N182 Chronic kidney disease, stage 2 (mild): Secondary | ICD-10-CM | POA: Diagnosis not present

## 2019-06-28 DIAGNOSIS — R269 Unspecified abnormalities of gait and mobility: Secondary | ICD-10-CM | POA: Diagnosis not present

## 2019-07-03 DIAGNOSIS — E119 Type 2 diabetes mellitus without complications: Secondary | ICD-10-CM | POA: Diagnosis not present

## 2019-07-03 DIAGNOSIS — M5136 Other intervertebral disc degeneration, lumbar region: Secondary | ICD-10-CM | POA: Diagnosis not present

## 2019-07-03 DIAGNOSIS — N182 Chronic kidney disease, stage 2 (mild): Secondary | ICD-10-CM | POA: Diagnosis not present

## 2019-07-03 DIAGNOSIS — R42 Dizziness and giddiness: Secondary | ICD-10-CM | POA: Diagnosis not present

## 2019-07-03 DIAGNOSIS — R269 Unspecified abnormalities of gait and mobility: Secondary | ICD-10-CM | POA: Diagnosis not present

## 2019-07-03 DIAGNOSIS — I951 Orthostatic hypotension: Secondary | ICD-10-CM | POA: Diagnosis not present

## 2019-07-03 DIAGNOSIS — R531 Weakness: Secondary | ICD-10-CM | POA: Diagnosis not present

## 2019-07-05 DIAGNOSIS — R42 Dizziness and giddiness: Secondary | ICD-10-CM | POA: Diagnosis not present

## 2019-07-05 DIAGNOSIS — E119 Type 2 diabetes mellitus without complications: Secondary | ICD-10-CM | POA: Diagnosis not present

## 2019-07-05 DIAGNOSIS — I951 Orthostatic hypotension: Secondary | ICD-10-CM | POA: Diagnosis not present

## 2019-07-05 DIAGNOSIS — R531 Weakness: Secondary | ICD-10-CM | POA: Diagnosis not present

## 2019-07-05 DIAGNOSIS — R269 Unspecified abnormalities of gait and mobility: Secondary | ICD-10-CM | POA: Diagnosis not present

## 2019-07-05 DIAGNOSIS — N182 Chronic kidney disease, stage 2 (mild): Secondary | ICD-10-CM | POA: Diagnosis not present

## 2019-07-05 DIAGNOSIS — M5136 Other intervertebral disc degeneration, lumbar region: Secondary | ICD-10-CM | POA: Diagnosis not present

## 2019-07-08 DIAGNOSIS — R531 Weakness: Secondary | ICD-10-CM | POA: Diagnosis not present

## 2019-07-08 DIAGNOSIS — I951 Orthostatic hypotension: Secondary | ICD-10-CM | POA: Diagnosis not present

## 2019-07-08 DIAGNOSIS — R42 Dizziness and giddiness: Secondary | ICD-10-CM | POA: Diagnosis not present

## 2019-07-08 DIAGNOSIS — R269 Unspecified abnormalities of gait and mobility: Secondary | ICD-10-CM | POA: Diagnosis not present

## 2019-07-08 DIAGNOSIS — N182 Chronic kidney disease, stage 2 (mild): Secondary | ICD-10-CM | POA: Diagnosis not present

## 2019-07-08 DIAGNOSIS — M5136 Other intervertebral disc degeneration, lumbar region: Secondary | ICD-10-CM | POA: Diagnosis not present

## 2019-07-08 DIAGNOSIS — E119 Type 2 diabetes mellitus without complications: Secondary | ICD-10-CM | POA: Diagnosis not present

## 2019-07-10 ENCOUNTER — Other Ambulatory Visit: Payer: Self-pay | Admitting: Internal Medicine

## 2019-07-10 DIAGNOSIS — G8929 Other chronic pain: Secondary | ICD-10-CM

## 2019-07-10 DIAGNOSIS — M545 Low back pain, unspecified: Secondary | ICD-10-CM

## 2019-07-10 DIAGNOSIS — N289 Disorder of kidney and ureter, unspecified: Secondary | ICD-10-CM | POA: Diagnosis not present

## 2019-07-12 DIAGNOSIS — I951 Orthostatic hypotension: Secondary | ICD-10-CM | POA: Diagnosis not present

## 2019-07-12 DIAGNOSIS — R269 Unspecified abnormalities of gait and mobility: Secondary | ICD-10-CM | POA: Diagnosis not present

## 2019-07-12 DIAGNOSIS — R42 Dizziness and giddiness: Secondary | ICD-10-CM | POA: Diagnosis not present

## 2019-07-12 DIAGNOSIS — N182 Chronic kidney disease, stage 2 (mild): Secondary | ICD-10-CM | POA: Diagnosis not present

## 2019-07-12 DIAGNOSIS — M5136 Other intervertebral disc degeneration, lumbar region: Secondary | ICD-10-CM | POA: Diagnosis not present

## 2019-07-12 DIAGNOSIS — E119 Type 2 diabetes mellitus without complications: Secondary | ICD-10-CM | POA: Diagnosis not present

## 2019-07-12 DIAGNOSIS — R531 Weakness: Secondary | ICD-10-CM | POA: Diagnosis not present

## 2019-07-15 ENCOUNTER — Encounter: Payer: Self-pay | Admitting: Cardiology

## 2019-07-15 DIAGNOSIS — E119 Type 2 diabetes mellitus without complications: Secondary | ICD-10-CM | POA: Diagnosis not present

## 2019-07-15 DIAGNOSIS — I951 Orthostatic hypotension: Secondary | ICD-10-CM | POA: Diagnosis not present

## 2019-07-15 DIAGNOSIS — R531 Weakness: Secondary | ICD-10-CM | POA: Diagnosis not present

## 2019-07-15 DIAGNOSIS — R269 Unspecified abnormalities of gait and mobility: Secondary | ICD-10-CM | POA: Diagnosis not present

## 2019-07-15 DIAGNOSIS — M5136 Other intervertebral disc degeneration, lumbar region: Secondary | ICD-10-CM | POA: Diagnosis not present

## 2019-07-15 DIAGNOSIS — R42 Dizziness and giddiness: Secondary | ICD-10-CM | POA: Diagnosis not present

## 2019-07-15 DIAGNOSIS — N182 Chronic kidney disease, stage 2 (mild): Secondary | ICD-10-CM | POA: Diagnosis not present

## 2019-07-17 DIAGNOSIS — R531 Weakness: Secondary | ICD-10-CM | POA: Diagnosis not present

## 2019-07-17 DIAGNOSIS — E119 Type 2 diabetes mellitus without complications: Secondary | ICD-10-CM | POA: Diagnosis not present

## 2019-07-17 DIAGNOSIS — R269 Unspecified abnormalities of gait and mobility: Secondary | ICD-10-CM | POA: Diagnosis not present

## 2019-07-17 DIAGNOSIS — M5136 Other intervertebral disc degeneration, lumbar region: Secondary | ICD-10-CM | POA: Diagnosis not present

## 2019-07-17 DIAGNOSIS — N182 Chronic kidney disease, stage 2 (mild): Secondary | ICD-10-CM | POA: Diagnosis not present

## 2019-07-17 DIAGNOSIS — I951 Orthostatic hypotension: Secondary | ICD-10-CM | POA: Diagnosis not present

## 2019-07-17 DIAGNOSIS — R42 Dizziness and giddiness: Secondary | ICD-10-CM | POA: Diagnosis not present

## 2019-07-19 ENCOUNTER — Other Ambulatory Visit: Payer: Self-pay

## 2019-07-19 ENCOUNTER — Ambulatory Visit
Admission: RE | Admit: 2019-07-19 | Discharge: 2019-07-19 | Disposition: A | Discharge status: Home / Self Care | Payer: Medicare HMO | Source: Ambulatory Visit | Attending: Internal Medicine | Admitting: Internal Medicine

## 2019-07-19 DIAGNOSIS — M545 Low back pain: Secondary | ICD-10-CM | POA: Diagnosis not present

## 2019-07-19 DIAGNOSIS — G8929 Other chronic pain: Secondary | ICD-10-CM

## 2019-07-19 MED ORDER — IOPAMIDOL (ISOVUE-M 200) INJECTION 41%
1.0000 mL | Freq: Once | INTRAMUSCULAR | Status: AC
  Administered 2019-07-19: 09:00:00 1 mL via EPIDURAL

## 2019-07-19 MED ORDER — METHYLPREDNISOLONE ACETATE 40 MG/ML INJ SUSP (RADIOLOG
120.0000 mg | Freq: Once | INTRAMUSCULAR | Status: AC
  Administered 2019-07-19: 09:00:00 120 mg via EPIDURAL

## 2019-07-19 NOTE — Discharge Instructions (Signed)

## 2019-08-13 ENCOUNTER — Other Ambulatory Visit: Payer: Self-pay | Admitting: Gastroenterology

## 2019-08-13 DIAGNOSIS — K7469 Other cirrhosis of liver: Secondary | ICD-10-CM

## 2019-08-19 ENCOUNTER — Other Ambulatory Visit: Payer: Self-pay | Admitting: Internal Medicine

## 2019-08-19 DIAGNOSIS — K7460 Unspecified cirrhosis of liver: Secondary | ICD-10-CM

## 2019-08-19 DIAGNOSIS — R945 Abnormal results of liver function studies: Secondary | ICD-10-CM

## 2019-08-19 DIAGNOSIS — K760 Fatty (change of) liver, not elsewhere classified: Secondary | ICD-10-CM

## 2019-08-19 DIAGNOSIS — R7989 Other specified abnormal findings of blood chemistry: Secondary | ICD-10-CM

## 2019-08-21 ENCOUNTER — Other Ambulatory Visit: Payer: Medicare HMO

## 2019-08-21 ENCOUNTER — Ambulatory Visit
Admission: RE | Admit: 2019-08-21 | Discharge: 2019-08-21 | Disposition: A | Discharge status: Home / Self Care | Payer: Medicare (Managed Care) | Source: Ambulatory Visit | Attending: Internal Medicine | Admitting: Internal Medicine

## 2019-08-21 DIAGNOSIS — R945 Abnormal results of liver function studies: Secondary | ICD-10-CM

## 2019-08-21 DIAGNOSIS — R7989 Other specified abnormal findings of blood chemistry: Secondary | ICD-10-CM

## 2019-08-21 DIAGNOSIS — K7460 Unspecified cirrhosis of liver: Secondary | ICD-10-CM

## 2019-08-21 DIAGNOSIS — K760 Fatty (change of) liver, not elsewhere classified: Secondary | ICD-10-CM

## 2019-09-20 ENCOUNTER — Other Ambulatory Visit: Payer: Self-pay | Admitting: Cardiology

## 2019-09-26 ENCOUNTER — Telehealth: Payer: Self-pay

## 2019-10-10 ENCOUNTER — Other Ambulatory Visit: Payer: Self-pay | Admitting: Internal Medicine

## 2019-10-10 DIAGNOSIS — R110 Nausea: Secondary | ICD-10-CM

## 2019-10-10 DIAGNOSIS — H9313 Tinnitus, bilateral: Secondary | ICD-10-CM

## 2019-10-25 ENCOUNTER — Ambulatory Visit: Payer: Medicare Other | Admitting: Cardiology

## 2019-10-29 NOTE — Progress Notes (Deleted)
Primary Physician/Referring:  Merrilee Seashore, MD  Patient ID: [name], male    DOB: [birth year], 79 y.o.   MRN: [id number]  No chief complaint on file.  HPI:    [name]  is a 79 y.o. male  with PAF-on flecainide, hypertension, hyperlipidemia, DM, rheumatoid arthritis, last seen in Oct. 2018.   *** Patient is doing well and has no complaints today Except for severe sciatic pain in both legs making his physical activity much reduced and also bilateral leg edema.  He was admitted to hospital in 07/2017 with pneumonia, found to have severe hyponatremia. This resolved on further lab testing by his PCP.  He is maintaining sinus rhythm on current therapy. He denies any bleeding issues.  Past Medical History:  Diagnosis Date  . DM (diabetes mellitus) ([place])   . GERD (gastroesophageal reflux disease)   . Hyperlipidemia   . Hypertension   . 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 Liver cirrhosis secondary to NASH ([place])   . Osteoarthritis   . Osteopenia   . Rheumatoid arthritis ([place])   . Thyroid disease    Past Surgical History:  Procedure Laterality Date  . COLONOSCOPY    . IR PARACENTESIS  12/10/2018  . KNEE SURGERY Bilateral    to clear out arthritis   Family History  Problem Relation Age of Onset  . Diabetes Mellitus I Father   . Diabetes Mellitus I Mother   . CAD Brother     Social History   Tobacco Use  . Smoking status: Former Smoker    Packs/day: 0.50    Years: 20.00    Pack years: 10.00    Types: Cigarettes    Quit date: 11/02/2014    Years since quitting: 4.9  . Smokeless tobacco: Never Used  Substance Use Topics  . Alcohol use: Not Currently    Alcohol/week: 0.0 standard drinks    Comment: occasional, no h/o heavy use   Marital Status: Married  ROS  ***Review of Systems  Cardiovascular: Positive for leg swelling. Negative for dyspnea on exertion and syncope.  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  Gastrointestinal: Negative for melena.   Objective  There were no vitals taken for this visit.  Vitals with BMI 07/19/2019 04/10/2019 12/26/2018  Height - - -  Weight - - -  BMI - - -  Systolic 025 95 94  Diastolic 54 54 66  Pulse 74 - 110     ***Physical Exam  Constitutional: He appears well-developed and well-nourished. No distress.  Cardiovascular: Normal rate, regular rhythm and intact distal pulses. Exam reveals no gallop.  No murmur heard. 2+ bilateral lower extremity pitting edema   Pulmonary/Chest: Effort normal and breath sounds normal. No accessory muscle usage.  Abdominal: Soft. Bowel sounds are normal.   Laboratory examination:   Recent Labs    12/24/18 0238 12/24/18 1518 12/25/18 0216 12/25/18 1614 12/26/18 0222  NA 132*  --  135  --  134*  K 2.7*   < > 2.9* 4.4 3.9  CL 100  --  99  --  101  CO2 23  --  25  --  23  GLUCOSE 119*  --  111*  --  116*  BUN 8  --  7*  --  5*  CREATININE 1.21  --  1.17  --  0.94  CALCIUM 9.1  --  9.2  --  8.9  GFRNONAA 57*  --  59*  --  >  60  GFRAA >60  --  >60  --  >60   < > = values in this interval not displayed.   CrCl cannot be calculated (Patient's most recent lab result is older than the maximum 21 days allowed.).  CMP Latest Ref Rng & Units 12/26/2018 12/25/2018 12/25/2018  Glucose 70 - 99 mg/dL 116(H) - 111(H)  BUN 8 - 23 mg/dL 5(L) - 7(L)  Creatinine 0.61 - 1.24 mg/dL 0.94 - 1.17  Sodium 135 - 145 mmol/L 134(L) - 135  Potassium 3.5 - 5.1 mmol/L 3.9 4.4 2.9(L)  Chloride 98 - 111 mmol/L 101 - 99  CO2 22 - 32 mmol/L 23 - 25  Calcium 8.9 - 10.3 mg/dL 8.9 - 9.2  Total Protein 6.5 - 8.1 g/dL 5.6(L) - 5.5(L)  Total Bilirubin 0.3 - 1.2 mg/dL 1.3(H) - 1.4(H)  Alkaline Phos 38 - 126 U/L 61 - 57  AST 15 - 41 U/L 63(H) - 46(H)  ALT 0 - 44 U/L 27 - 21   CBC Latest Ref Rng & Units 12/26/2018 12/25/2018 12/24/2018  WBC 4.0 - 10.5 K/uL 7.8 7.2 6.3  Hemoglobin 13.0 - 17.0 g/dL 10.8(L) 10.6(L) 10.0(L)  Hematocrit 39.0 - 52.0 %  32.2(L) 31.0(L) 28.8(L)  Platelets 150 - 400 K/uL 106(L) 114(L) 118(L)    Lipid Panel No results found for: CHOL No results found for: HDL No results found for: LDLCALC No results found for: TRIG No results found for: CHOLHDL No results found for: LDLDIRECT  No results found for: CHOL, TRIG, HDL, CHOLHDL, VLDL, LDLCALC, LDLDIRECT  HEMOGLOBIN A1C Lab Results  Component Value Date   HGBA1C 6.2 (H) 08/13/2017   MPG 131.24 08/13/2017   TSH No results for input(s): TSH in the last 8760 hours.  External labs:  *** HDL 29.000 01/23/2019 LDL-C 39.000 M 10/29/2018 Cholesterol, total 97.000 01/23/2019 Triglycerides 63.000 01/23/2019  A1C 6.600 % 10/29/2018  Glucose Random 233.000 07/10/2019 BUN 12.000 07/10/2019 Creatinine, Serum 1.330 07/10/2019  TSH 3.910 10/29/2018  FOBT: Normal 05/04/2017  PSA 0.700 04/07/2016  Medications and allergies   Allergies  Allergen Reactions  . Hydrocodone Nausea Only     Current Outpatient Medications  Medication Instructions  . diclofenac Sodium (VOLTAREN) 1 % GEL diclofenac 1 % topical gel  . docusate sodium (COLACE) 100 mg, Oral, 2 times daily  . ELIQUIS 5 MG TABS tablet Take 1 tablet by mouth twice daily  . folic acid (FOLVITE) 1 mg, Oral, Daily  . furosemide (LASIX) 40 mg, Oral  . levothyroxine (SYNTHROID) 100 mcg, Oral, Daily before breakfast  . magnesium oxide (MAG-OX) 400 mg, Oral, 2 times daily  . metFORMIN (GLUCOPHAGE) 500-1,000 mg, Oral, See admin instructions, Take 1 tablet (526m) by mouth once daily in the morning, and 2 tablets (10047m in the evening   . metoprolol tartrate (LOPRESSOR) 25 MG tablet Take 1 tablet by mouth twice daily  . Multiple Vitamin (MULTIVITAMIN) tablet 1 tablet, Oral, Daily  . rosuvastatin (CRESTOR) 10 mg, Oral, Daily  . spironolactone (ALDACTONE) 50 mg, Oral, 2 times daily  . vitamin B-12 (CYANOCOBALAMIN) 1,000 mcg, Oral, Daily   There are no discontinued medications.  Radiology:   No results  found.  Cardiac Studies:   Echo 05/20/2015: Left ventricle cavity is normal in size. Normal global wall motion. Normal diastolic filling pattern. Calculated EF 63%. Trace mitral regurgitation.  Trace tricuspid regurgitation. Trace pulmonic regurgitation.  Essentially normal echocardiogram.  Nuclear stress test 05/08/2015: 1. The resting electrocardiogram demonstrated normal sinus rhythm, normal resting conduction and  no resting arrhythmias.  Stress EKG is non-diagnostic for ischemia as it a pharmacologic stress using Lexiscan. Stress symptoms included dyspnea. 2. The perfusion imaging study demonstrates mild gut uptake artifact in the inferior wall but no evidence of ischemia or scar.  Left ventricle is normal in size in both rest and stress images.  Left ventricular systolic function calculated by QGS was 61%.  This is a low risk study.  Abdominal aortic duplex 05/14/2015: Focal plaque noted in the mid and distal aorta.  Normal iliac velocity.   EKG  ***    10/26/2018: Normal sinus rhythm at rate of 92 bpm, normal axis, incomplete right bundle branch block.  No evidence of ischemia, normal QT interval.  Low-voltage, axis.  Assessment  No diagnosis found.   Recommendations:   ***Patient was here for annual visit and follow-up approximate of fibrillation.  He is maintaining sinus rhythm on flecainide.  No bleeding diathesis.  His diabetes is also well controlled but he does have peripheral neuropathy and also sciatica that is made his life miserable with regard to physical activity.  Advised him to try alternative medicine for relief of pain.  It seems that he is not a surgical candidate.  With regard to hypertension, blood pressure is well controlled.  Leg edema is probably related to venous insufficiency.  We discussed regarding wearing support stockings.  He may need venous insufficiency study.  From heart standpoint though there is no evidence of congestive heart failure.  I'll see him  back in a year.  Adrian Prows, MD, [facility] 10/29/2019, 2:36 PM [facility] [place] Pager: [phone] Office: [phone]

## 2019-10-30 ENCOUNTER — Ambulatory Visit: Payer: Medicare (Managed Care) | Admitting: Cardiology

## 2019-11-12 ENCOUNTER — Telehealth: Payer: Self-pay

## 2019-11-12 NOTE — Telephone Encounter (Signed)
Patient wife called to let you know patient is currently admitted at [facility]. She has a few questions for you and is requesting a call from you please.   Home #: [phone] Cell # [phone]

## 2019-11-26 ENCOUNTER — Telehealth: Payer: Self-pay

## 2019-11-29 ENCOUNTER — Ambulatory Visit: Payer: Medicare (Managed Care) | Admitting: Cardiology

## 2019-12-22 DEATH — deceased

## 2020-07-14 IMAGING — US US ABDOMEN COMPLETE
1 series · 13 of 25 positions shown · non-contrast
Comparison: CT abdomen and pelvis March 30, 2017

CLINICAL DATA: Elevated liver enzymes

EXAM:
ABDOMEN ULTRASOUND COMPLETE

[Series 1: us abdomen complete · 0.15mm/px · 13 of 75 slices shown]
[im 1/75]
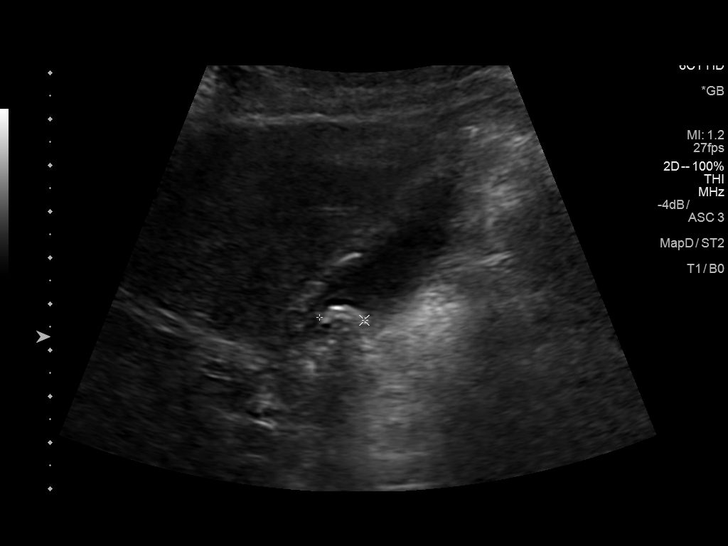
[im 7/75]
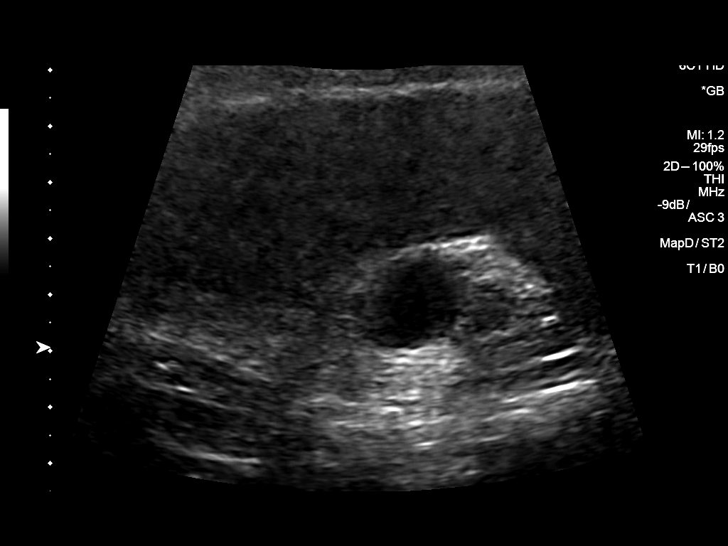
[im 13/75]
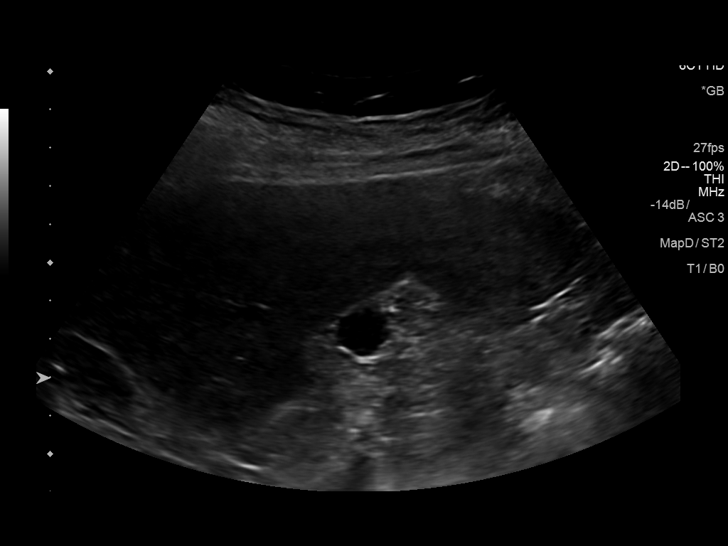
[im 19/75]
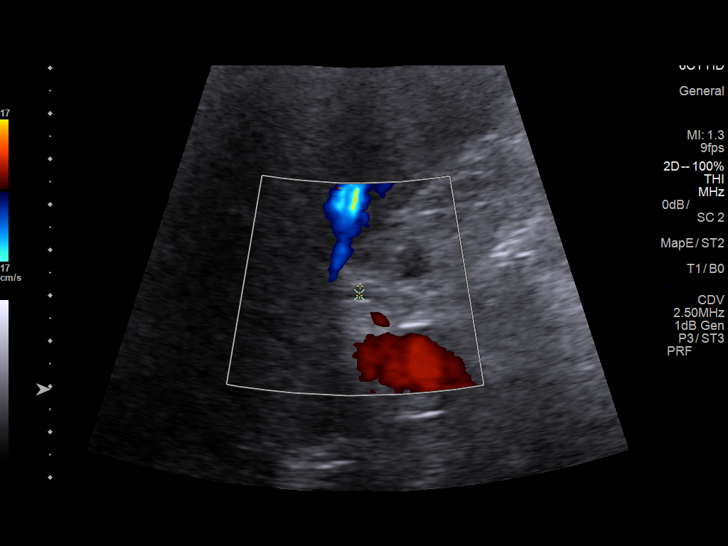
[im 25/75]
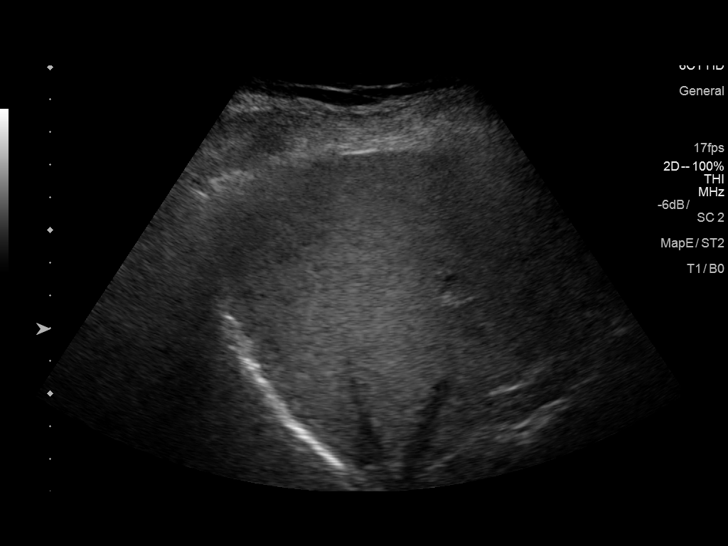
[im 31/75]
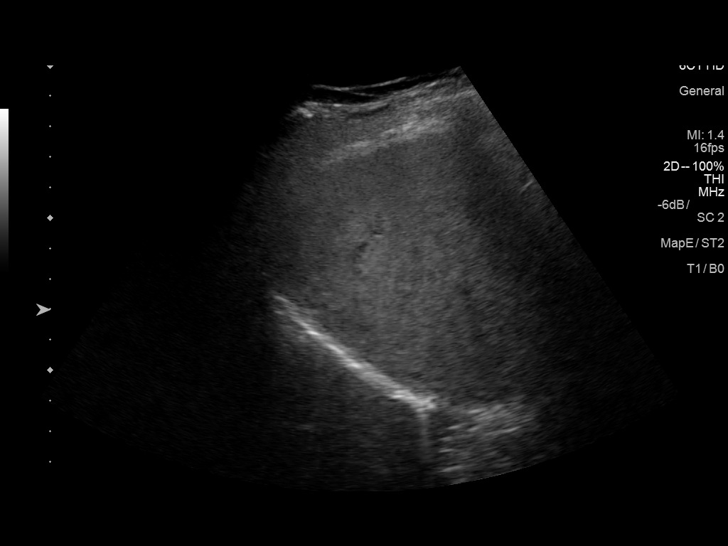
[im 38/75]
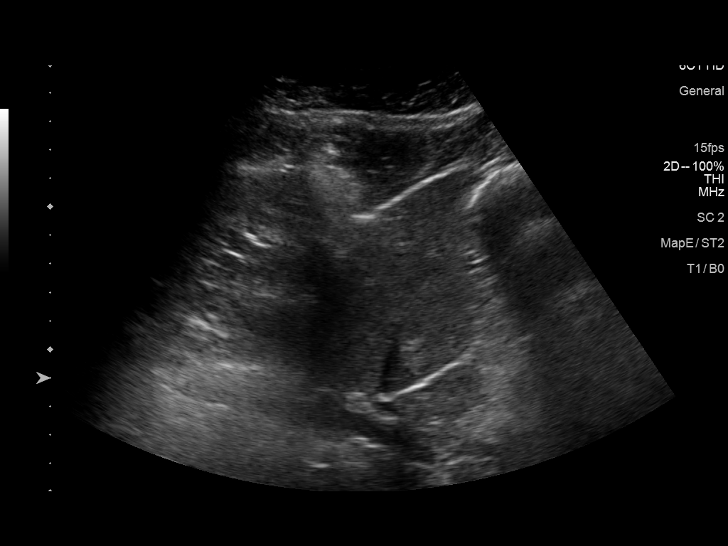
[im 44/75]
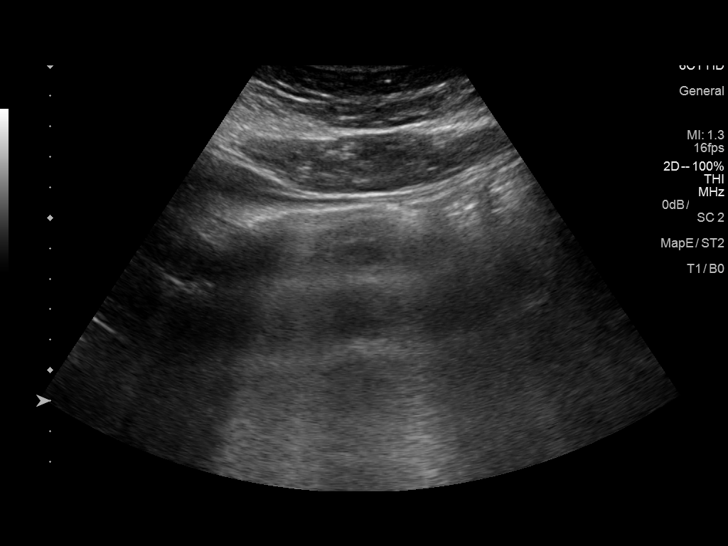
[im 50/75]
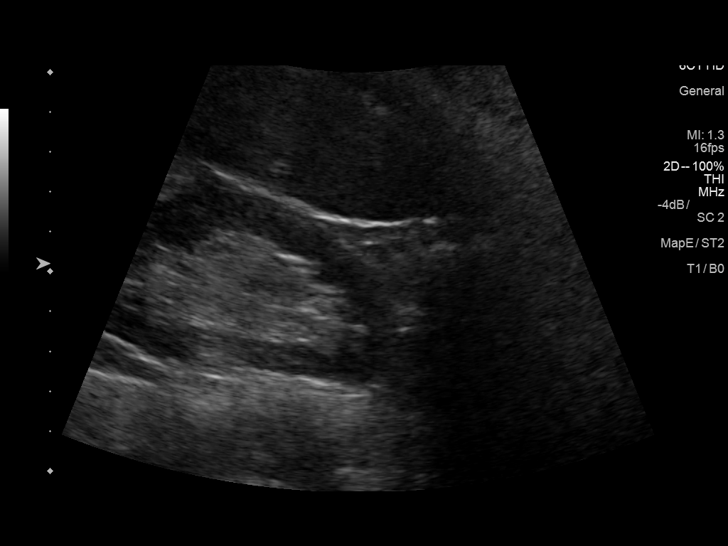
[im 56/75]
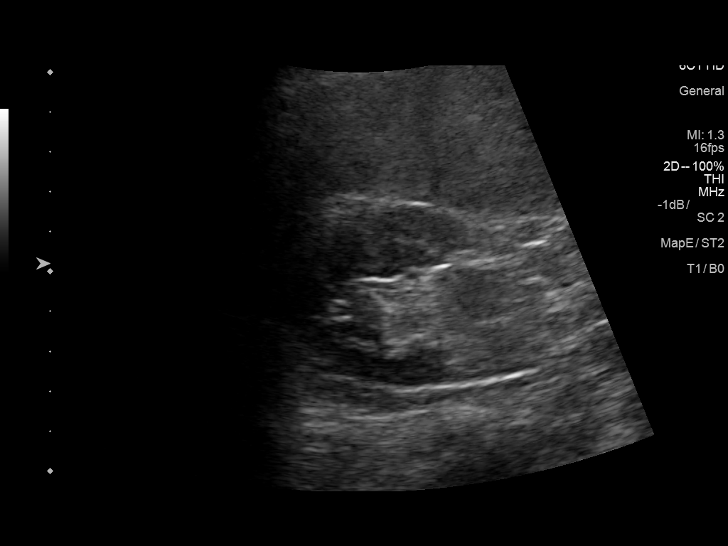
[im 62/75]
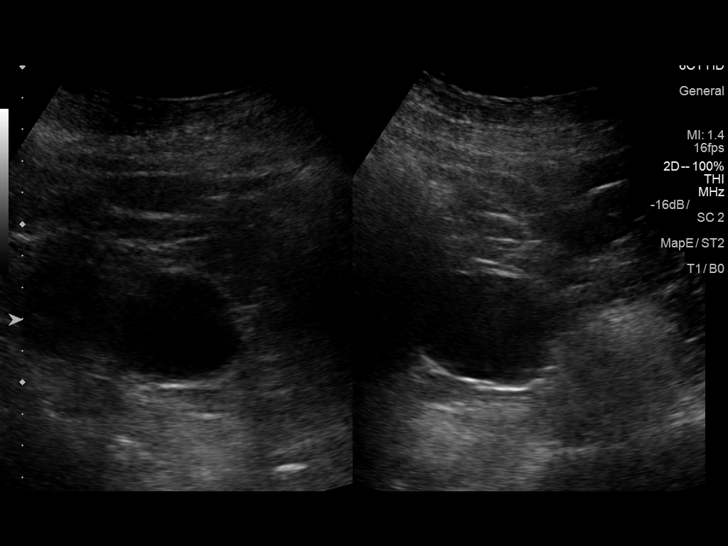
[im 68/75]
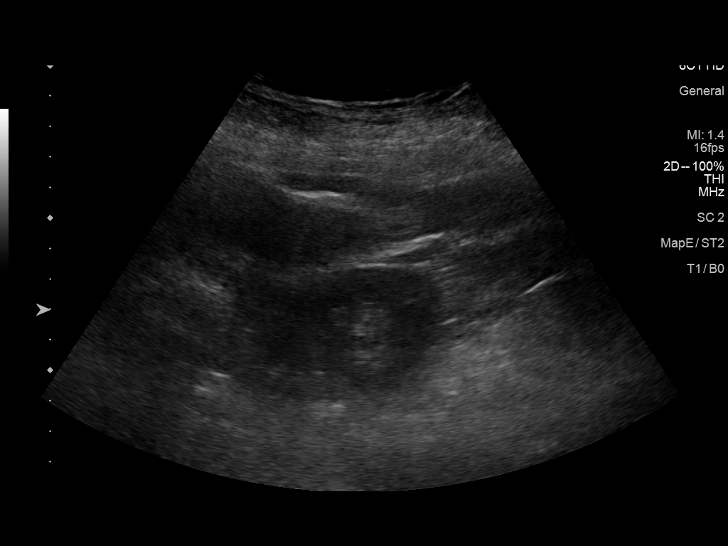
[im 75/75]
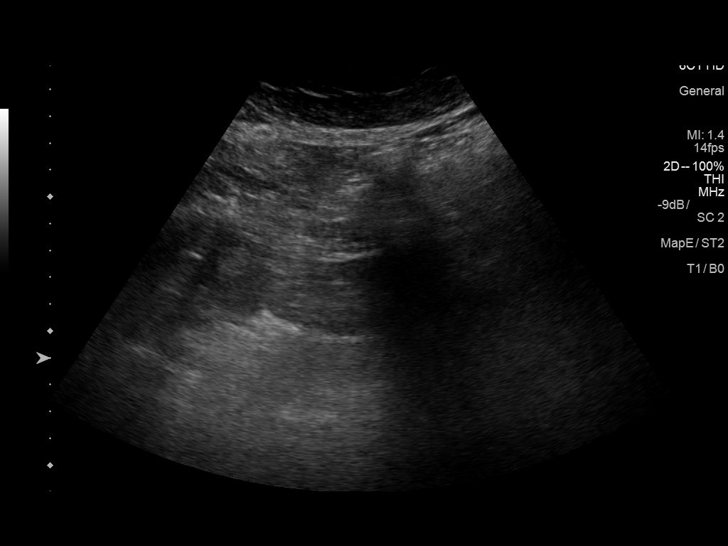

[13 of 25 positions shown; findings below may reference images not displayed]

FINDINGS: Gallbladder: There is an echogenic focus along the posterior wall of
the gallbladder which shadows but does not show appreciable
movement, a likely adherent gallstone. This finding was apparent on
prior CT examination. There is no evident gallbladder wall
thickening or pericholecystic fluid. No sonographic Murphy sign
noted by sonographer.

Common bile duct: Diameter: 2 mm. No intrahepatic, common hepatic,
or common bile duct dilatation.

Liver: No focal lesion identified. Liver echogenicity overall is
increased.. Portal vein is patent on color Doppler imaging with
normal direction of blood flow towards the liver.

IVC: No abnormality visualized. Much of the infrahepatic inferior
vena cava is obscured by gas.

Pancreas: Pancreas is virtually completely obscured by gas.

Spleen: Size and appearance within normal limits.

Right Kidney: Length: 8.7 cm. Echogenicity within normal limits. No
mass or hydronephrosis visualized.

Left Kidney: Length: 10.0 cm. Echogenicity within normal limits. No
hydronephrosis visualized. There is a cyst arising from the lower
pole left kidney measuring 3.8 x 3.5 x 4.2 cm.

Abdominal aorta: Most of the aorta is obscured by gas.

Other findings: No apparent ascites.
IMPRESSION: 1. 1 cm suspected adherent gallstone within the gallbladder. No
gallbladder wall thickening or pericholecystic fluid.

2. Diffuse increase in liver echogenicity, a finding felt to
represent hepatic steatosis. While no focal liver lesions are
evident on this study, it must be cautioned that the sensitivity of
ultrasound for detection of focal liver lesions is diminished in
this circumstance.

3. Infrahepatic inferior vena cava, aorta, and pancreas virtually
completely obscured by gas.

4. Small right kidney. Etiology uncertain. Question renal artery
stenosis given this appearance. In this regard, question whether
patient is hypertensive.

5.  3.8 x 3.5 x 4.2 cm cyst arising from lower pole left kidney.
